# Patient Record
Sex: Female | Born: 1965 | Race: Black or African American | Hispanic: No | Marital: Married | Smoking: Never smoker
Health system: Southern US, Community
[De-identification: ages and names within clinical notes are randomized; demographics above are authoritative.]

## PROBLEM LIST (undated history)

## (undated) DIAGNOSIS — M1990 Unspecified osteoarthritis, unspecified site: Secondary | ICD-10-CM

## (undated) DIAGNOSIS — I959 Hypotension, unspecified: Secondary | ICD-10-CM

## (undated) DIAGNOSIS — S83206A Unspecified tear of unspecified meniscus, current injury, right knee, initial encounter: Secondary | ICD-10-CM

## (undated) DIAGNOSIS — R55 Syncope and collapse: Secondary | ICD-10-CM

## (undated) DIAGNOSIS — H6690 Otitis media, unspecified, unspecified ear: Secondary | ICD-10-CM

## (undated) DIAGNOSIS — K219 Gastro-esophageal reflux disease without esophagitis: Secondary | ICD-10-CM

## (undated) DIAGNOSIS — A0472 Enterocolitis due to Clostridium difficile, not specified as recurrent: Secondary | ICD-10-CM

## (undated) DIAGNOSIS — R839 Unspecified abnormal finding in cerebrospinal fluid: Secondary | ICD-10-CM

## (undated) DIAGNOSIS — B9681 Helicobacter pylori [H. pylori] as the cause of diseases classified elsewhere: Secondary | ICD-10-CM

## (undated) DIAGNOSIS — E669 Obesity, unspecified: Secondary | ICD-10-CM

## (undated) DIAGNOSIS — G932 Benign intracranial hypertension: Secondary | ICD-10-CM

## (undated) DIAGNOSIS — E785 Hyperlipidemia, unspecified: Secondary | ICD-10-CM

## (undated) DIAGNOSIS — K2970 Gastritis, unspecified, without bleeding: Secondary | ICD-10-CM

## (undated) DIAGNOSIS — E039 Hypothyroidism, unspecified: Secondary | ICD-10-CM

## (undated) DIAGNOSIS — N200 Calculus of kidney: Secondary | ICD-10-CM

## (undated) DIAGNOSIS — I10 Essential (primary) hypertension: Secondary | ICD-10-CM

## (undated) DIAGNOSIS — H543 Unqualified visual loss, both eyes: Secondary | ICD-10-CM

## (undated) HISTORY — DX: Obesity, unspecified: E66.9

## (undated) HISTORY — DX: Hyperlipidemia, unspecified: E78.5

## (undated) HISTORY — DX: Unspecified osteoarthritis, unspecified site: M19.90

## (undated) HISTORY — DX: Helicobacter pylori (H. pylori) as the cause of diseases classified elsewhere: B96.81

## (undated) HISTORY — DX: Enterocolitis due to Clostridium difficile, not specified as recurrent: A04.72

## (undated) HISTORY — DX: Calculus of kidney: N20.0

## (undated) HISTORY — DX: Hypothyroidism, unspecified: E03.9

## (undated) HISTORY — DX: Essential (primary) hypertension: I10

## (undated) HISTORY — DX: Benign intracranial hypertension: G93.2

## (undated) HISTORY — DX: Gastro-esophageal reflux disease without esophagitis: K21.9

## (undated) HISTORY — PX: TONSILLECTOMY: SUR1361

## (undated) HISTORY — DX: Gastritis, unspecified, without bleeding: K29.70

## (undated) HISTORY — DX: Syncope and collapse: R55

---

## 1992-12-22 DIAGNOSIS — H543 Unqualified visual loss, both eyes: Secondary | ICD-10-CM

## 1992-12-22 HISTORY — DX: Unqualified visual loss, both eyes: H54.3

## 1998-02-11 ENCOUNTER — Other Ambulatory Visit: Admission: RE | Admit: 1998-02-11 | Discharge: 1998-02-11 | Payer: Self-pay | Admitting: Family Medicine

## 1998-02-27 ENCOUNTER — Ambulatory Visit (HOSPITAL_COMMUNITY): Admission: RE | Admit: 1998-02-27 | Discharge: 1998-02-27 | Payer: Self-pay | Admitting: Neurology

## 1998-05-04 ENCOUNTER — Other Ambulatory Visit: Admission: RE | Admit: 1998-05-04 | Discharge: 1998-05-04 | Payer: Self-pay | Admitting: Obstetrics and Gynecology

## 1998-05-08 ENCOUNTER — Ambulatory Visit (HOSPITAL_COMMUNITY): Admission: RE | Admit: 1998-05-08 | Discharge: 1998-05-08 | Payer: Self-pay | Admitting: Obstetrics and Gynecology

## 1998-05-14 ENCOUNTER — Encounter: Admission: RE | Admit: 1998-05-14 | Discharge: 1998-08-12 | Payer: Self-pay | Admitting: Obstetrics

## 1998-06-10 ENCOUNTER — Encounter: Admission: RE | Admit: 1998-06-10 | Discharge: 1998-09-08 | Payer: Self-pay | Admitting: Obstetrics & Gynecology

## 1998-06-22 ENCOUNTER — Ambulatory Visit (HOSPITAL_COMMUNITY): Admission: RE | Admit: 1998-06-22 | Discharge: 1998-06-22 | Payer: Self-pay | Admitting: Obstetrics & Gynecology

## 1998-07-21 ENCOUNTER — Ambulatory Visit (HOSPITAL_COMMUNITY): Admission: RE | Admit: 1998-07-21 | Discharge: 1998-07-21 | Payer: Self-pay | Admitting: Obstetrics

## 1998-07-23 ENCOUNTER — Ambulatory Visit (HOSPITAL_COMMUNITY): Admission: RE | Admit: 1998-07-23 | Discharge: 1998-07-23 | Payer: Self-pay | Admitting: Obstetrics & Gynecology

## 1998-07-29 ENCOUNTER — Encounter: Admission: RE | Admit: 1998-07-29 | Discharge: 1998-07-29 | Payer: Self-pay | Admitting: Obstetrics & Gynecology

## 1998-08-05 ENCOUNTER — Encounter: Admission: RE | Admit: 1998-08-05 | Discharge: 1998-08-05 | Payer: Self-pay | Admitting: Obstetrics & Gynecology

## 1998-08-06 ENCOUNTER — Ambulatory Visit (HOSPITAL_COMMUNITY): Admission: RE | Admit: 1998-08-06 | Discharge: 1998-08-06 | Payer: Self-pay | Admitting: Neurology

## 1998-08-09 ENCOUNTER — Emergency Department (HOSPITAL_COMMUNITY): Admission: EM | Admit: 1998-08-09 | Discharge: 1998-08-09 | Payer: Self-pay | Admitting: *Deleted

## 1998-08-19 ENCOUNTER — Encounter: Admission: RE | Admit: 1998-08-19 | Discharge: 1998-08-19 | Payer: Self-pay | Admitting: Obstetrics

## 1998-09-02 ENCOUNTER — Encounter: Admission: RE | Admit: 1998-09-02 | Discharge: 1998-09-02 | Payer: Self-pay | Admitting: Obstetrics

## 1998-09-04 ENCOUNTER — Ambulatory Visit (HOSPITAL_COMMUNITY): Admission: RE | Admit: 1998-09-04 | Discharge: 1998-09-04 | Payer: Self-pay | Admitting: Obstetrics

## 1998-09-09 ENCOUNTER — Encounter: Admission: RE | Admit: 1998-09-09 | Discharge: 1998-09-09 | Payer: Self-pay | Admitting: Obstetrics & Gynecology

## 1998-09-11 ENCOUNTER — Encounter (HOSPITAL_COMMUNITY): Admission: RE | Admit: 1998-09-11 | Discharge: 1998-10-16 | Payer: Self-pay | Admitting: Obstetrics

## 1998-09-16 ENCOUNTER — Encounter: Admission: RE | Admit: 1998-09-16 | Discharge: 1998-09-16 | Payer: Self-pay | Admitting: Obstetrics & Gynecology

## 1998-09-30 ENCOUNTER — Encounter: Admission: RE | Admit: 1998-09-30 | Discharge: 1998-09-30 | Payer: Self-pay | Admitting: Obstetrics & Gynecology

## 1998-10-07 ENCOUNTER — Encounter: Admission: RE | Admit: 1998-10-07 | Discharge: 1998-10-07 | Payer: Self-pay | Admitting: Obstetrics & Gynecology

## 1998-10-14 ENCOUNTER — Inpatient Hospital Stay (HOSPITAL_COMMUNITY): Admission: AD | Admit: 1998-10-14 | Discharge: 1998-10-18 | Payer: Self-pay | Admitting: Obstetrics

## 1999-06-17 ENCOUNTER — Encounter: Admission: RE | Admit: 1999-06-17 | Discharge: 1999-06-17 | Payer: Self-pay | Admitting: Obstetrics

## 1999-06-25 ENCOUNTER — Ambulatory Visit (HOSPITAL_COMMUNITY): Admission: RE | Admit: 1999-06-25 | Discharge: 1999-06-25 | Payer: Self-pay | Admitting: *Deleted

## 1999-07-01 ENCOUNTER — Encounter: Admission: RE | Admit: 1999-07-01 | Discharge: 1999-07-01 | Payer: Self-pay | Admitting: Obstetrics

## 1999-07-20 ENCOUNTER — Ambulatory Visit (HOSPITAL_COMMUNITY): Admission: RE | Admit: 1999-07-20 | Discharge: 1999-07-20 | Payer: Self-pay | Admitting: *Deleted

## 1999-07-22 ENCOUNTER — Encounter: Admission: RE | Admit: 1999-07-22 | Discharge: 1999-07-22 | Payer: Self-pay | Admitting: Obstetrics

## 1999-08-05 ENCOUNTER — Encounter: Admission: RE | Admit: 1999-08-05 | Discharge: 1999-08-05 | Payer: Self-pay | Admitting: Obstetrics

## 1999-09-02 ENCOUNTER — Encounter: Admission: RE | Admit: 1999-09-02 | Discharge: 1999-09-02 | Payer: Self-pay | Admitting: Obstetrics

## 1999-09-23 ENCOUNTER — Ambulatory Visit (HOSPITAL_COMMUNITY): Admission: RE | Admit: 1999-09-23 | Discharge: 1999-09-23 | Payer: Self-pay | Admitting: Obstetrics & Gynecology

## 1999-09-23 ENCOUNTER — Encounter: Admission: RE | Admit: 1999-09-23 | Discharge: 1999-09-23 | Payer: Self-pay | Admitting: Obstetrics

## 1999-09-29 ENCOUNTER — Encounter: Admission: RE | Admit: 1999-09-29 | Discharge: 1999-12-28 | Payer: Self-pay | Admitting: Obstetrics & Gynecology

## 1999-09-29 ENCOUNTER — Encounter: Admission: RE | Admit: 1999-09-29 | Discharge: 1999-09-29 | Payer: Self-pay | Admitting: Obstetrics & Gynecology

## 1999-10-13 ENCOUNTER — Encounter: Admission: RE | Admit: 1999-10-13 | Discharge: 1999-10-13 | Payer: Self-pay | Admitting: Obstetrics & Gynecology

## 1999-10-27 ENCOUNTER — Encounter: Admission: RE | Admit: 1999-10-27 | Discharge: 1999-10-27 | Payer: Self-pay | Admitting: Obstetrics & Gynecology

## 1999-11-10 ENCOUNTER — Encounter: Admission: RE | Admit: 1999-11-10 | Discharge: 1999-11-10 | Payer: Self-pay | Admitting: Obstetrics & Gynecology

## 1999-11-17 ENCOUNTER — Encounter: Admission: RE | Admit: 1999-11-17 | Discharge: 1999-11-17 | Payer: Self-pay | Admitting: Obstetrics & Gynecology

## 1999-11-17 ENCOUNTER — Encounter (HOSPITAL_COMMUNITY): Admission: RE | Admit: 1999-11-17 | Discharge: 1999-12-30 | Payer: Self-pay | Admitting: Obstetrics & Gynecology

## 1999-11-24 ENCOUNTER — Encounter: Admission: RE | Admit: 1999-11-24 | Discharge: 1999-11-24 | Payer: Self-pay | Admitting: Obstetrics & Gynecology

## 1999-12-01 ENCOUNTER — Encounter: Admission: RE | Admit: 1999-12-01 | Discharge: 1999-12-01 | Payer: Self-pay | Admitting: Obstetrics & Gynecology

## 1999-12-08 ENCOUNTER — Encounter: Admission: RE | Admit: 1999-12-08 | Discharge: 1999-12-08 | Payer: Self-pay | Admitting: Obstetrics & Gynecology

## 1999-12-15 ENCOUNTER — Encounter: Admission: RE | Admit: 1999-12-15 | Discharge: 1999-12-15 | Payer: Self-pay | Admitting: Obstetrics & Gynecology

## 1999-12-22 ENCOUNTER — Encounter: Admission: RE | Admit: 1999-12-22 | Discharge: 1999-12-22 | Payer: Self-pay | Admitting: Obstetrics & Gynecology

## 1999-12-24 ENCOUNTER — Inpatient Hospital Stay (HOSPITAL_COMMUNITY): Admission: AD | Admit: 1999-12-24 | Discharge: 1999-12-29 | Payer: Self-pay | Admitting: *Deleted

## 2000-02-28 ENCOUNTER — Ambulatory Visit (HOSPITAL_COMMUNITY): Admission: RE | Admit: 2000-02-28 | Discharge: 2000-02-28 | Payer: Self-pay | Admitting: Neurology

## 2000-02-28 ENCOUNTER — Encounter: Payer: Self-pay | Admitting: Neurology

## 2001-05-01 ENCOUNTER — Encounter: Payer: Self-pay | Admitting: Neurology

## 2001-05-01 ENCOUNTER — Ambulatory Visit (HOSPITAL_COMMUNITY): Admission: RE | Admit: 2001-05-01 | Discharge: 2001-05-01 | Payer: Self-pay | Admitting: Neurology

## 2001-10-18 ENCOUNTER — Encounter: Admission: RE | Admit: 2001-10-18 | Discharge: 2001-10-18 | Payer: Self-pay | Admitting: Obstetrics

## 2001-10-18 ENCOUNTER — Ambulatory Visit (HOSPITAL_COMMUNITY): Admission: RE | Admit: 2001-10-18 | Discharge: 2001-10-18 | Payer: Self-pay | Admitting: Obstetrics

## 2001-10-25 ENCOUNTER — Encounter: Admission: RE | Admit: 2001-10-25 | Discharge: 2001-10-25 | Payer: Self-pay | Admitting: Obstetrics

## 2001-11-14 ENCOUNTER — Encounter: Admission: RE | Admit: 2001-11-14 | Discharge: 2001-11-14 | Payer: Self-pay | Admitting: Obstetrics & Gynecology

## 2001-12-05 ENCOUNTER — Encounter: Admission: RE | Admit: 2001-12-05 | Discharge: 2001-12-05 | Payer: Self-pay | Admitting: *Deleted

## 2001-12-12 ENCOUNTER — Encounter: Admission: RE | Admit: 2001-12-12 | Discharge: 2001-12-12 | Payer: Self-pay | Admitting: *Deleted

## 2001-12-14 ENCOUNTER — Ambulatory Visit (HOSPITAL_COMMUNITY): Admission: RE | Admit: 2001-12-14 | Discharge: 2001-12-14 | Payer: Self-pay | Admitting: *Deleted

## 2001-12-19 ENCOUNTER — Encounter: Admission: RE | Admit: 2001-12-19 | Discharge: 2001-12-19 | Payer: Self-pay | Admitting: *Deleted

## 2002-01-02 ENCOUNTER — Encounter: Admission: RE | Admit: 2002-01-02 | Discharge: 2002-01-02 | Payer: Self-pay | Admitting: *Deleted

## 2002-01-16 ENCOUNTER — Encounter: Admission: RE | Admit: 2002-01-16 | Discharge: 2002-01-16 | Payer: Self-pay | Admitting: *Deleted

## 2002-01-29 ENCOUNTER — Ambulatory Visit (HOSPITAL_COMMUNITY): Admission: RE | Admit: 2002-01-29 | Discharge: 2002-01-29 | Payer: Self-pay | Admitting: *Deleted

## 2002-01-30 ENCOUNTER — Encounter: Admission: RE | Admit: 2002-01-30 | Discharge: 2002-01-30 | Payer: Self-pay | Admitting: *Deleted

## 2002-02-06 ENCOUNTER — Encounter: Admission: RE | Admit: 2002-02-06 | Discharge: 2002-02-06 | Payer: Self-pay | Admitting: *Deleted

## 2002-02-20 ENCOUNTER — Encounter: Admission: RE | Admit: 2002-02-20 | Discharge: 2002-02-20 | Payer: Self-pay | Admitting: *Deleted

## 2002-02-20 ENCOUNTER — Encounter (HOSPITAL_COMMUNITY): Admission: AD | Admit: 2002-02-20 | Discharge: 2002-03-22 | Payer: Self-pay | Admitting: *Deleted

## 2002-02-27 ENCOUNTER — Encounter: Admission: RE | Admit: 2002-02-27 | Discharge: 2002-02-27 | Payer: Self-pay | Admitting: *Deleted

## 2002-03-06 ENCOUNTER — Encounter: Admission: RE | Admit: 2002-03-06 | Discharge: 2002-03-06 | Payer: Self-pay | Admitting: *Deleted

## 2002-03-13 ENCOUNTER — Encounter: Admission: RE | Admit: 2002-03-13 | Discharge: 2002-03-13 | Payer: Self-pay | Admitting: *Deleted

## 2002-03-13 ENCOUNTER — Ambulatory Visit (HOSPITAL_COMMUNITY): Admission: RE | Admit: 2002-03-13 | Discharge: 2002-03-13 | Payer: Self-pay | Admitting: *Deleted

## 2002-03-20 ENCOUNTER — Encounter: Admission: RE | Admit: 2002-03-20 | Discharge: 2002-03-20 | Payer: Self-pay | Admitting: *Deleted

## 2002-03-27 ENCOUNTER — Encounter: Admission: RE | Admit: 2002-03-27 | Discharge: 2002-03-27 | Payer: Self-pay | Admitting: Internal Medicine

## 2002-03-27 ENCOUNTER — Encounter (HOSPITAL_COMMUNITY): Admission: RE | Admit: 2002-03-27 | Discharge: 2002-03-31 | Payer: Self-pay | Admitting: *Deleted

## 2002-04-02 ENCOUNTER — Inpatient Hospital Stay (HOSPITAL_COMMUNITY): Admission: AD | Admit: 2002-04-02 | Discharge: 2002-04-06 | Payer: Self-pay | Admitting: *Deleted

## 2002-05-27 ENCOUNTER — Encounter: Payer: Self-pay | Admitting: Neurology

## 2002-05-27 ENCOUNTER — Ambulatory Visit (HOSPITAL_COMMUNITY): Admission: RE | Admit: 2002-05-27 | Discharge: 2002-05-27 | Payer: Self-pay | Admitting: Neurology

## 2003-07-20 ENCOUNTER — Emergency Department (HOSPITAL_COMMUNITY): Admission: EM | Admit: 2003-07-20 | Discharge: 2003-07-21 | Payer: Self-pay | Admitting: Emergency Medicine

## 2003-07-21 ENCOUNTER — Encounter: Payer: Self-pay | Admitting: Emergency Medicine

## 2003-07-21 IMAGING — CT CT HEAD W/O CM
1 of 2 series · 13 of 30 positions shown, 17 images · non-contrast
Comparison: none

FINDINGS
CLINICAL DATA: DIZZINESS.  HISTORY OF PSEUDO TUMOR CEREBRI AND VISION LOSS.
CT HEAD WITHOUT CONTRAST [DATE]
COMPARING TO REPORT FROM MRI OF THE BRAIN DATED [DATE].
TECHNIQUE: CONTIGUOUS AXIAL CT IMAGES WERE OBTAINED FROM THE SKULL BASE TO THE VERTEX.

[Series 2: — · axial · 0.43mm/px · z∈[-196,-76]mm · 13 of 29 slices shown, 17 images]
[im 3/29  brain]
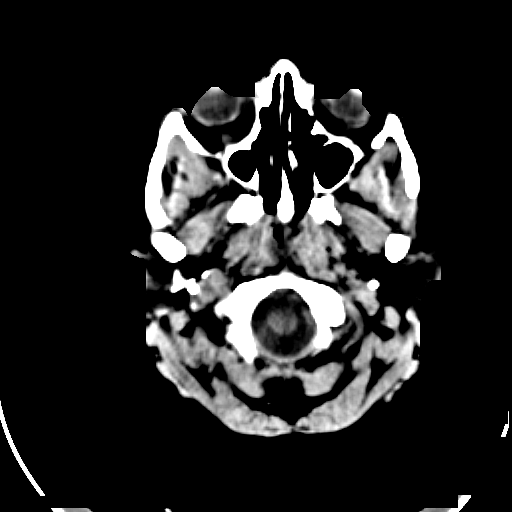
[im 3/29  bone]
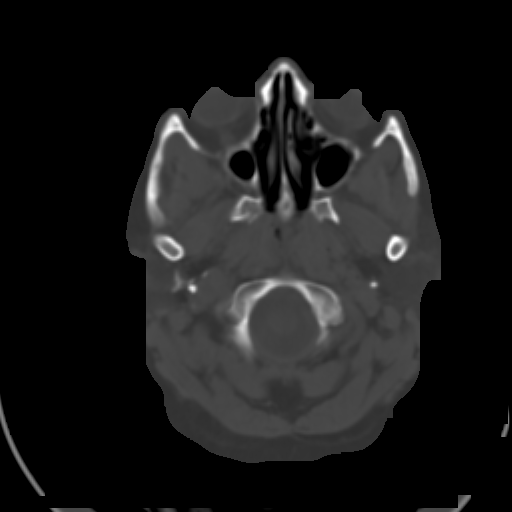
[im 5/29  brain]
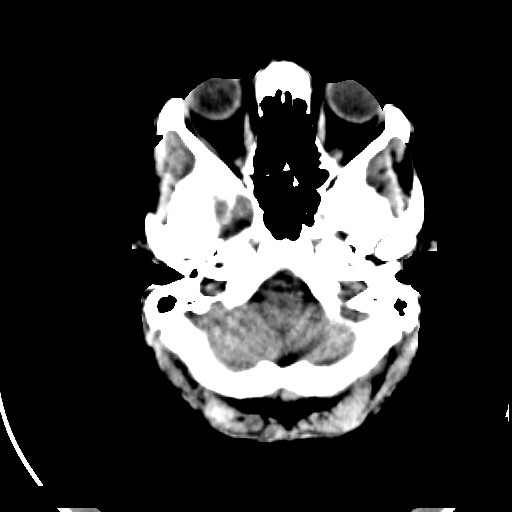
[im 7/29  brain]
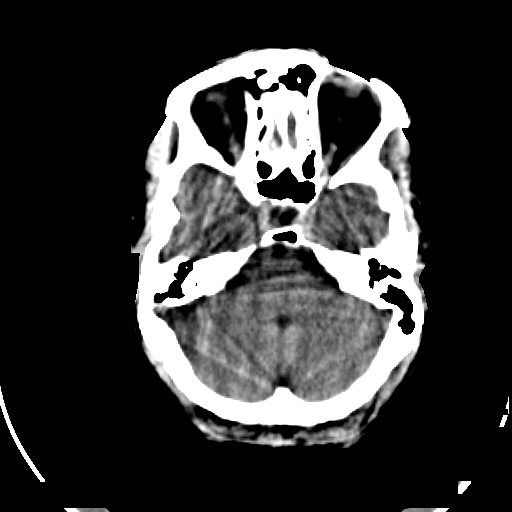
[im 9/29  brain]
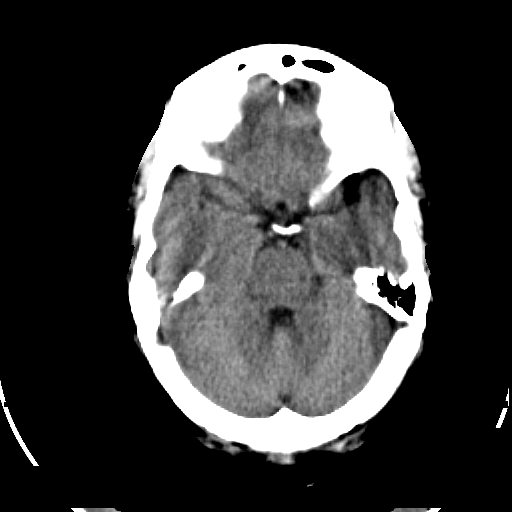
[im 11/29  brain]
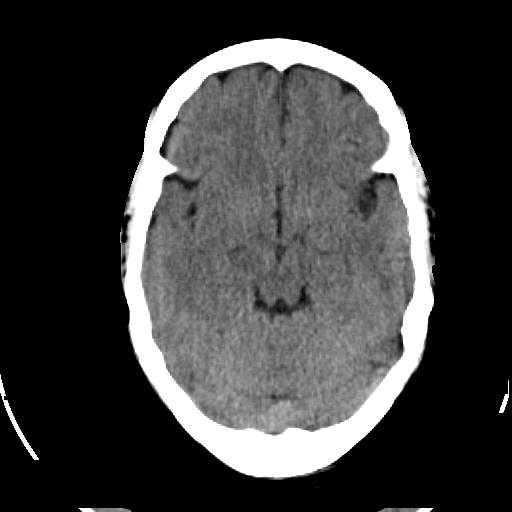
[im 11/29  bone]
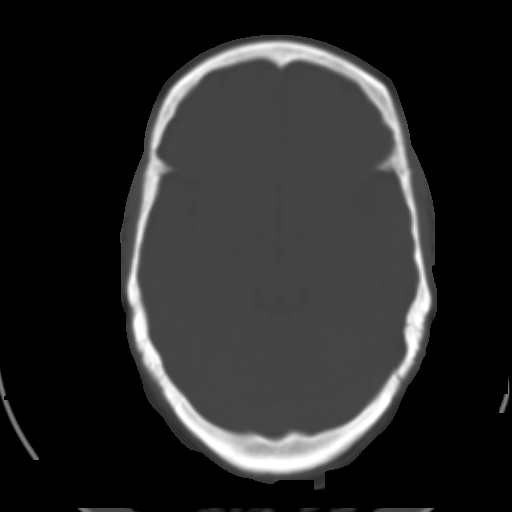
[im 13/29  brain]
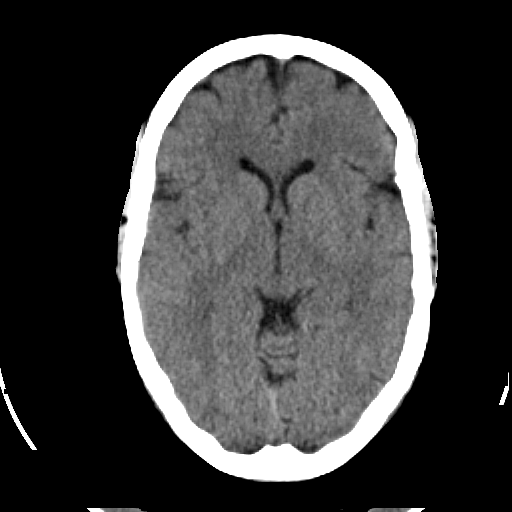
[im 15/29  brain]
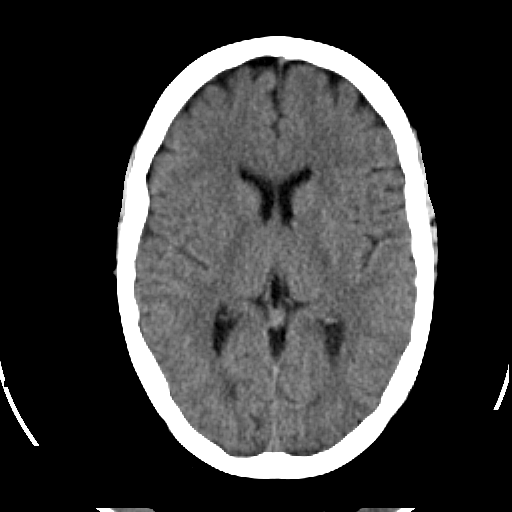
[im 17/29  brain]
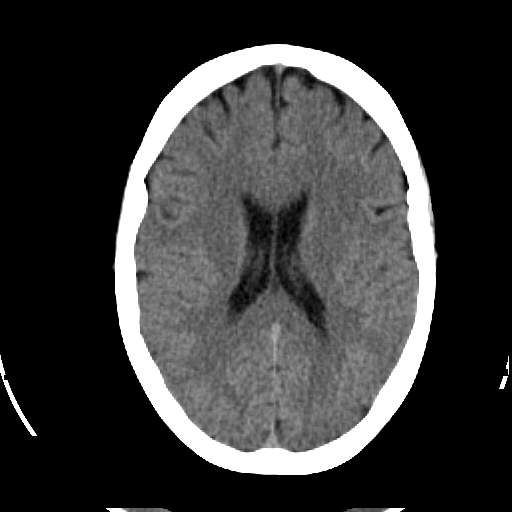
[im 19/29  brain]
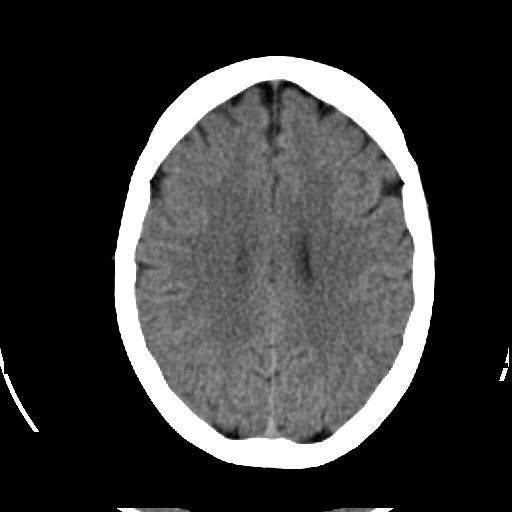
[im 19/29  bone]
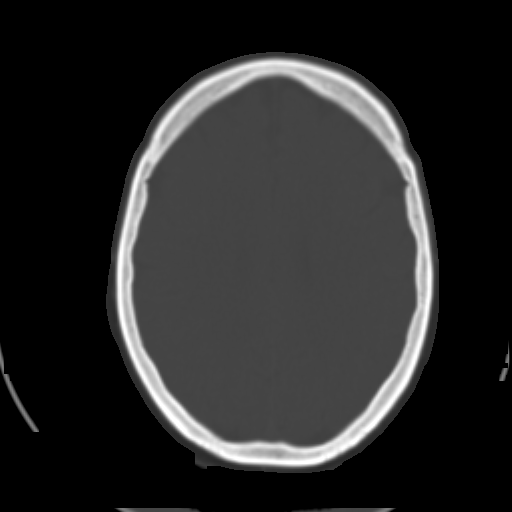
[im 21/29  brain]
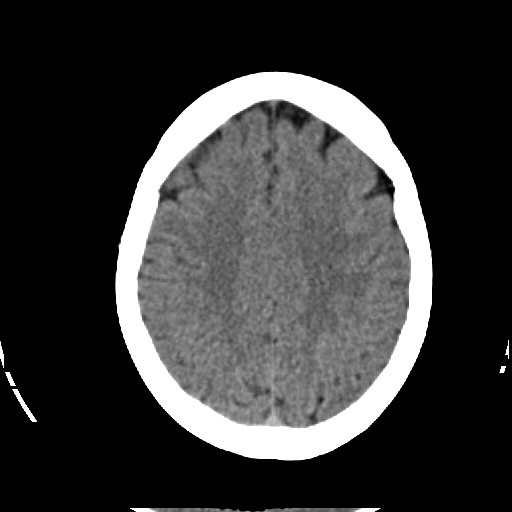
[im 23/29  brain]
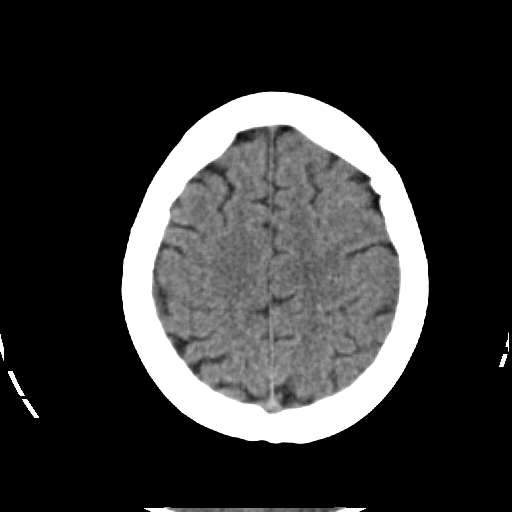
[im 25/29  brain]
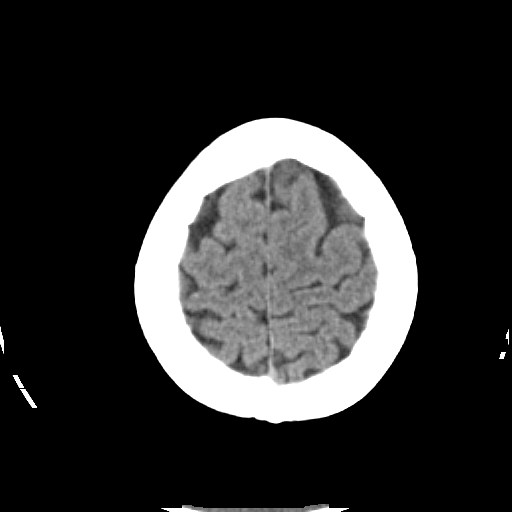
[im 27/29  brain]
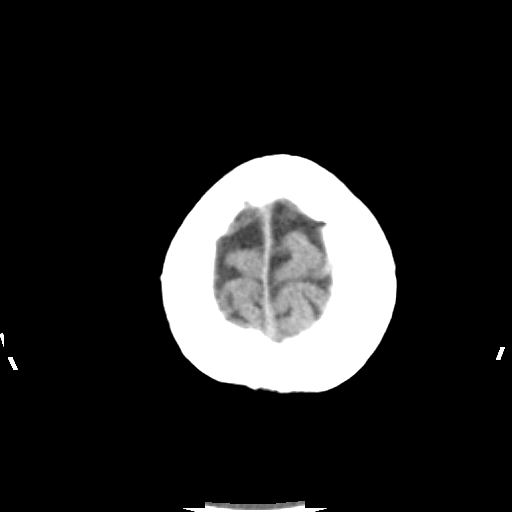
[im 27/29  bone]
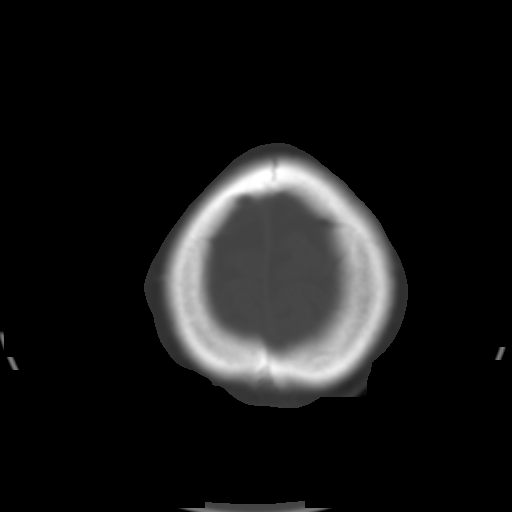

[13 of 30 positions shown; findings below may reference images not displayed]

FINDINGS: THERE IS NO EVIDENCE OF INTRACRANIAL HEMORRHAGE, BRAIN EDEMA, OR MASS EFFECT.  THE
VENTRICLES ARE NORMAL.  NO EXTRA-AXIAL ABNORMALITIES ARE IDENTIFIED.  BONE WINDOWS SHOW NO
SIGNIFICANT ABNORMALITIES.
IMPRESSION
NEGATIVE NON-CONTRAST HEAD CT.

## 2003-07-29 ENCOUNTER — Encounter: Payer: Self-pay | Admitting: Neurology

## 2003-07-29 ENCOUNTER — Ambulatory Visit (HOSPITAL_COMMUNITY): Admission: RE | Admit: 2003-07-29 | Discharge: 2003-07-29 | Payer: Self-pay | Admitting: Neurology

## 2004-08-30 ENCOUNTER — Ambulatory Visit (HOSPITAL_COMMUNITY): Admission: RE | Admit: 2004-08-30 | Discharge: 2004-08-30 | Payer: Self-pay | Admitting: Neurology

## 2004-08-30 IMAGING — CT CT HEAD W/O CM
1 of 2 series · 13 of 30 positions shown, 17 images · non-contrast
Comparison: [DATE].

CLINICAL DATA: 38-year-old female with headaches. 
 CT HEAD WITHOUT CONTRAST [DATE]:

[Series 2: brain · axial · 0.47mm/px · z∈[+153,+280]mm · 13 of 28 slices shown, 17 images]
[im 2/28  brain]
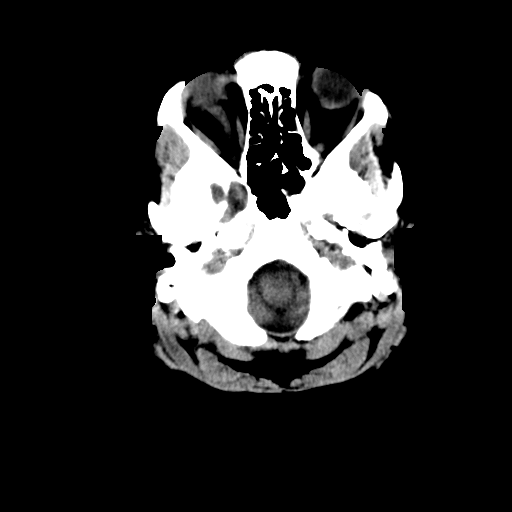
[im 2/28  bone]
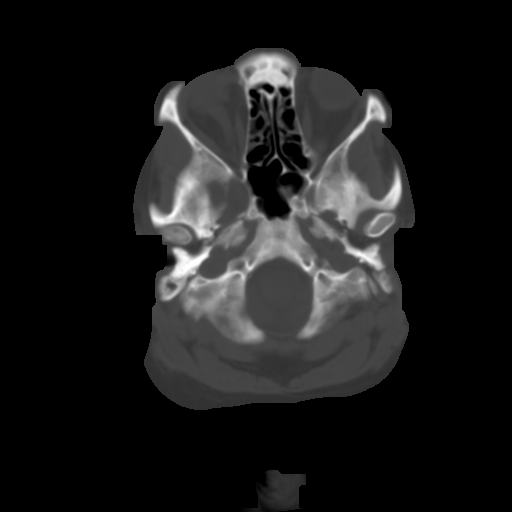
[im 4/28  brain]
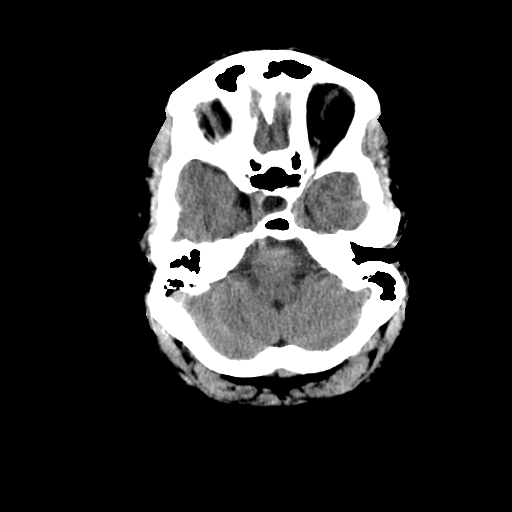
[im 6/28  brain]
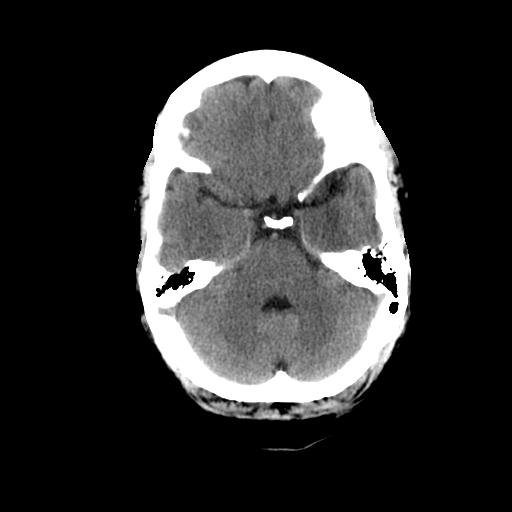
[im 8/28  brain]
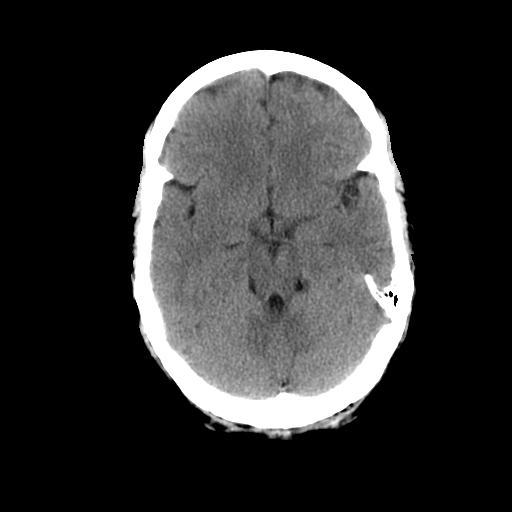
[im 10/28  brain]
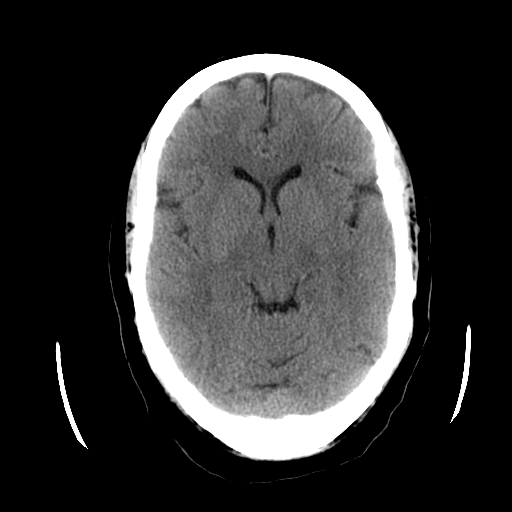
[im 10/28  bone]
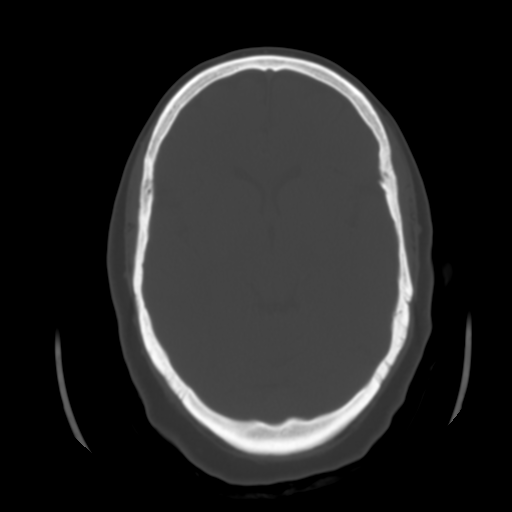
[im 12/28  brain]
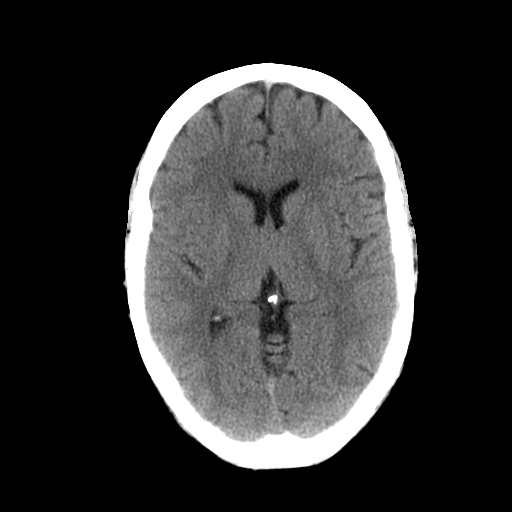
[im 14/28  brain]
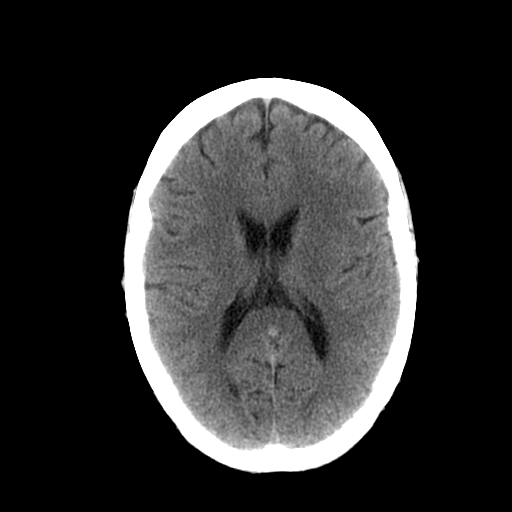
[im 16/28  brain]
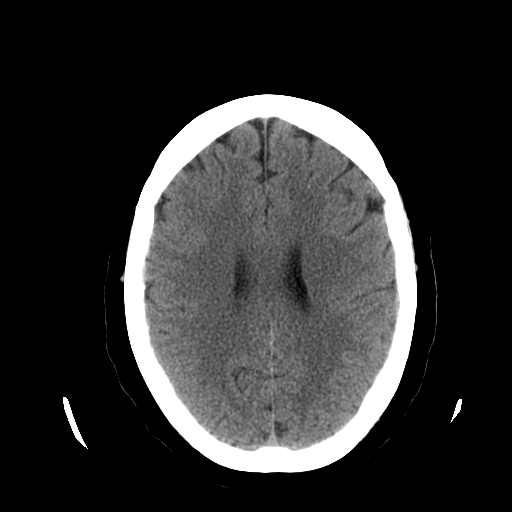
[im 18/28  brain]
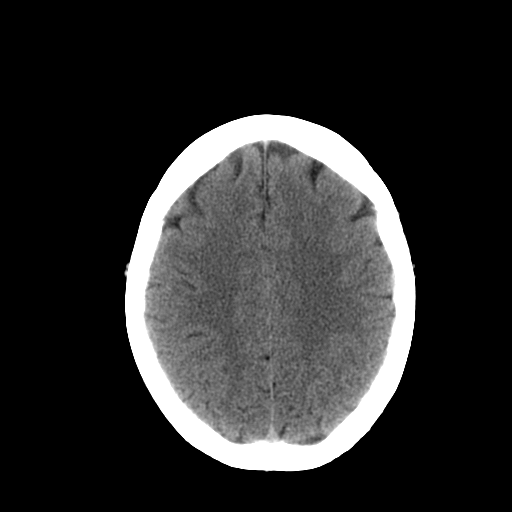
[im 18/28  bone]
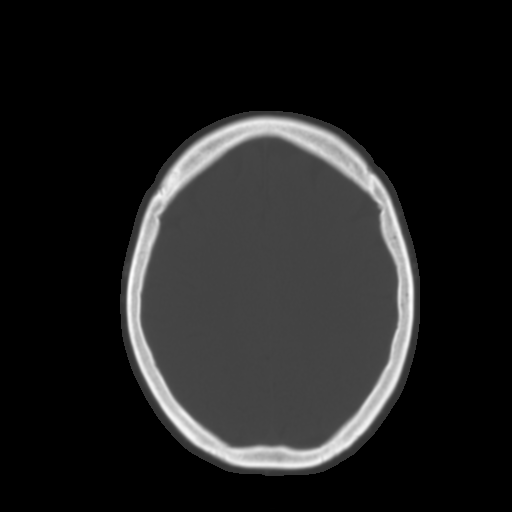
[im 20/28  brain]
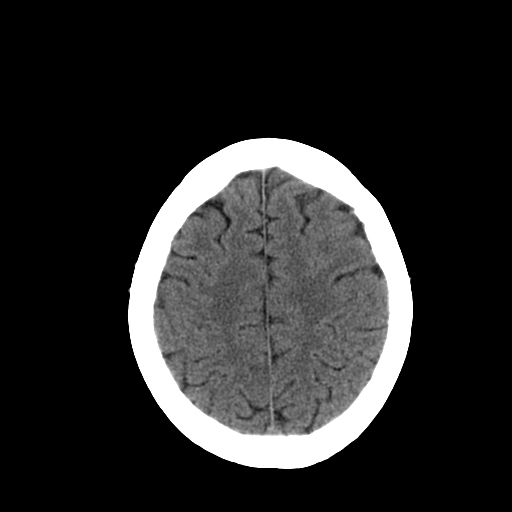
[im 22/28  brain]
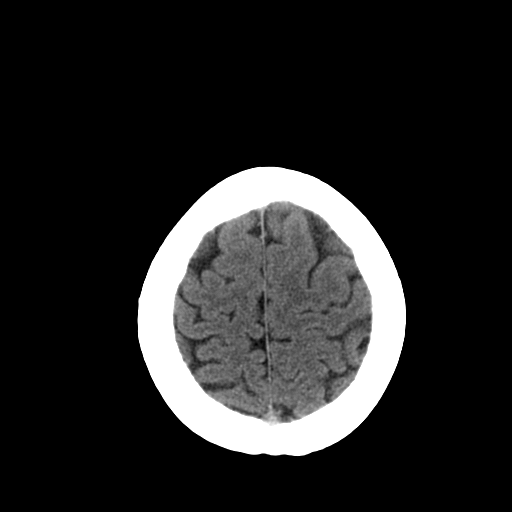
[im 24/28  brain]
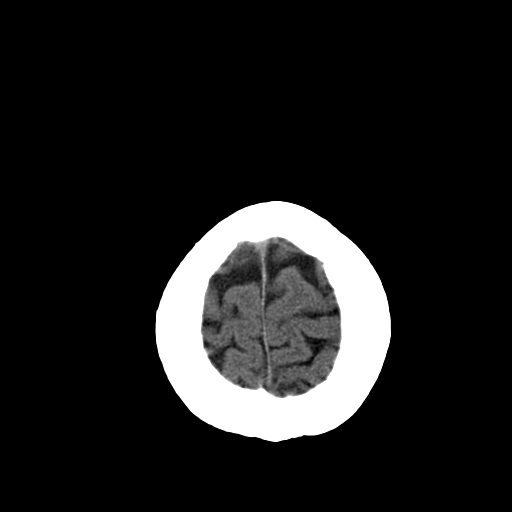
[im 26/28  brain]
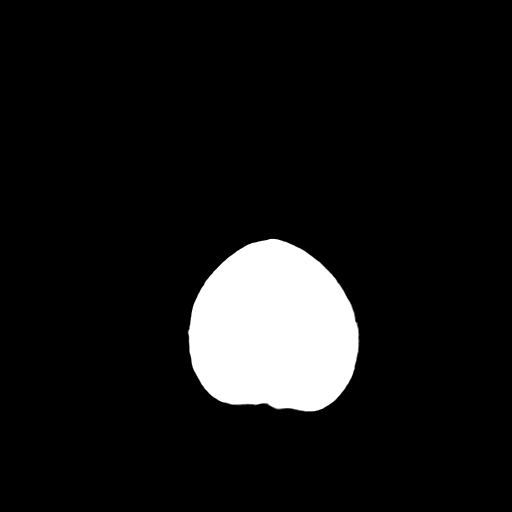
[im 26/28  bone]
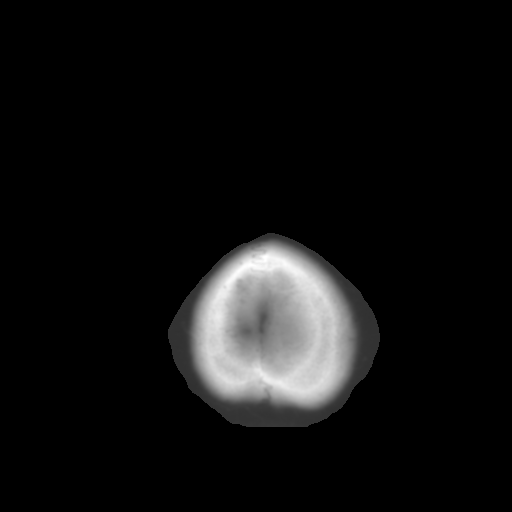

[13 of 30 positions shown; findings below may reference images not displayed]

FINDINGS: Standard noncontrast axial CT. 
 No acute edema, mass effect, hemorrhage, herniation, hydrocephalus, midline shift, or extraaxial fluid collection.  Ventricles are symmetric.  Cisterns are patent.  Gray and white matter differentiation is well-maintained.  Minimal ethmoidal and sphenoid mucosal thickening.  The mastoids are clear.
IMPRESSION: No acute intracranial abnormality.

## 2004-09-01 ENCOUNTER — Ambulatory Visit (HOSPITAL_COMMUNITY): Admission: RE | Admit: 2004-09-01 | Discharge: 2004-09-01 | Payer: Self-pay | Admitting: Neurology

## 2004-12-27 ENCOUNTER — Encounter: Admission: RE | Admit: 2004-12-27 | Discharge: 2005-03-27 | Payer: Self-pay | Admitting: Neurology

## 2006-03-16 ENCOUNTER — Encounter: Admission: RE | Admit: 2006-03-16 | Discharge: 2006-03-16 | Payer: Self-pay | Admitting: Neurology

## 2006-03-22 ENCOUNTER — Ambulatory Visit (HOSPITAL_COMMUNITY): Admission: RE | Admit: 2006-03-22 | Discharge: 2006-03-22 | Payer: Self-pay | Admitting: Neurology

## 2006-03-22 IMAGING — RF DG FLUORO GUIDE NDL PLC/BX
1 series · 1 of 1 positions shown · non-contrast
Comparison: none

CLINICAL DATA: Pseudotumor cerebri.

FLUOROSCOPICALLY GUIDED LUMBAR PUNCTURE  (THERAPEUTIC) [DATE]:
TECHNIQUE: Informed consent was obtained from the patient prior to the
procedure. Specific risks discussed with the patient included infection,
bleeding, and the possibility of an unsuccessful puncture. The patient voiced
understanding and agreed to proceed. The usual time out protocol was utilized.

[Series 1: run · 1 of 1 slices shown]
[im 1/1]
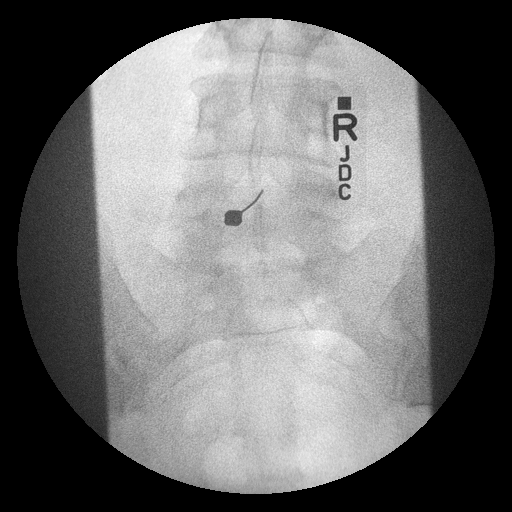

[1 of 1 positions shown; findings below may reference images not displayed]

Initially, the appropriate site for lumbar puncture was chosen with the aid of
fluoroscopy. Using the usual sterile technique and 1% lidocaine as local
anesthetic, a 20 gauge spinal needle was advanced into the thecal sac at the
L4-L5 level using fluoroscopic guidance. A total of 20 cc of clear, colorless
cerebrospinal fluid were removed. Opening pressure measured 24 cm of water. A
closing pressure was not measured, but there was no flow of CSF through the
needle upon completion.

The patient tolerated the procedure well and there were no apparent immediate
complications.
IMPRESSION: Uncomplicated therapeutic lumbar puncture under fluoroscopy as described.

## 2008-08-11 ENCOUNTER — Encounter: Admission: RE | Admit: 2008-08-11 | Discharge: 2008-08-11 | Payer: Self-pay | Admitting: Internal Medicine

## 2008-08-11 IMAGING — CR DG LUMBAR SPINE COMPLETE 4+V
5 series · 5 of 5 positions shown · non-contrast
Comparison: None

CLINICAL DATA: Back pain.

LUMBAR SPINE - COMPLETE 4+ VIEW

[view not recorded (1 of 5)]
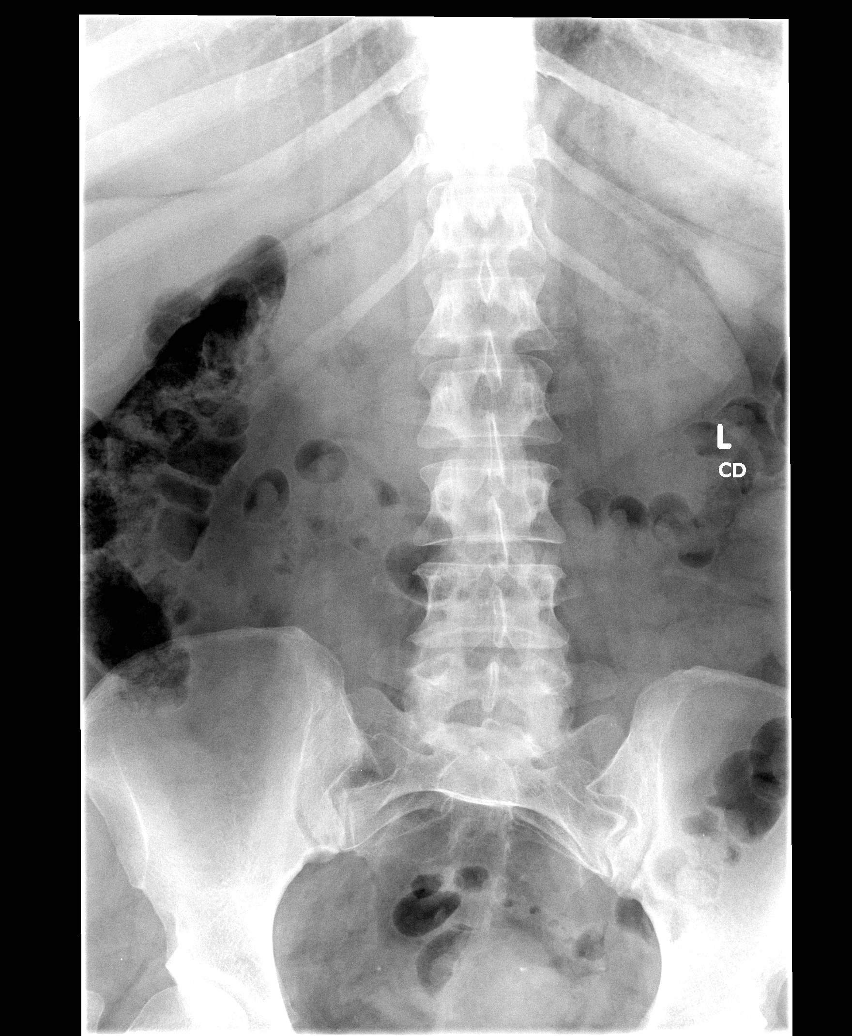

[view not recorded (2 of 5)]
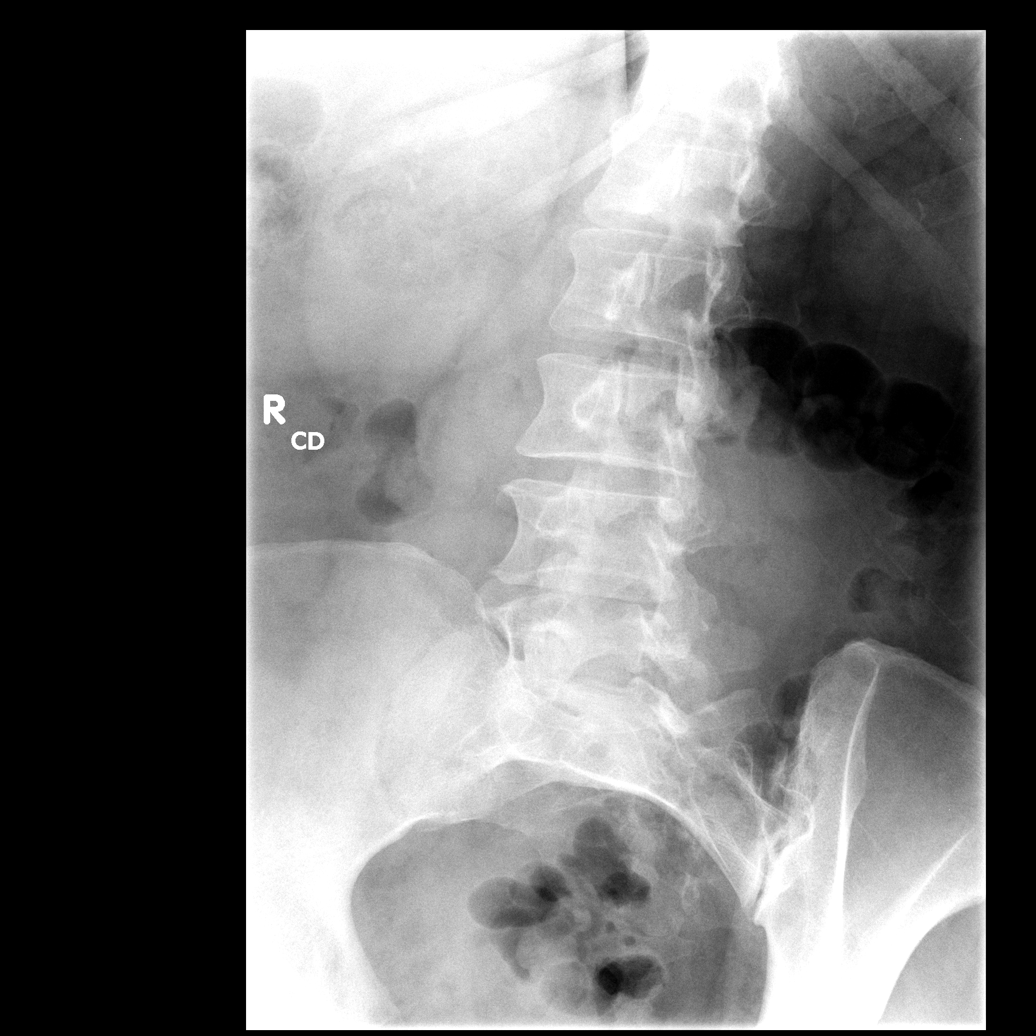

[view not recorded (3 of 5)]
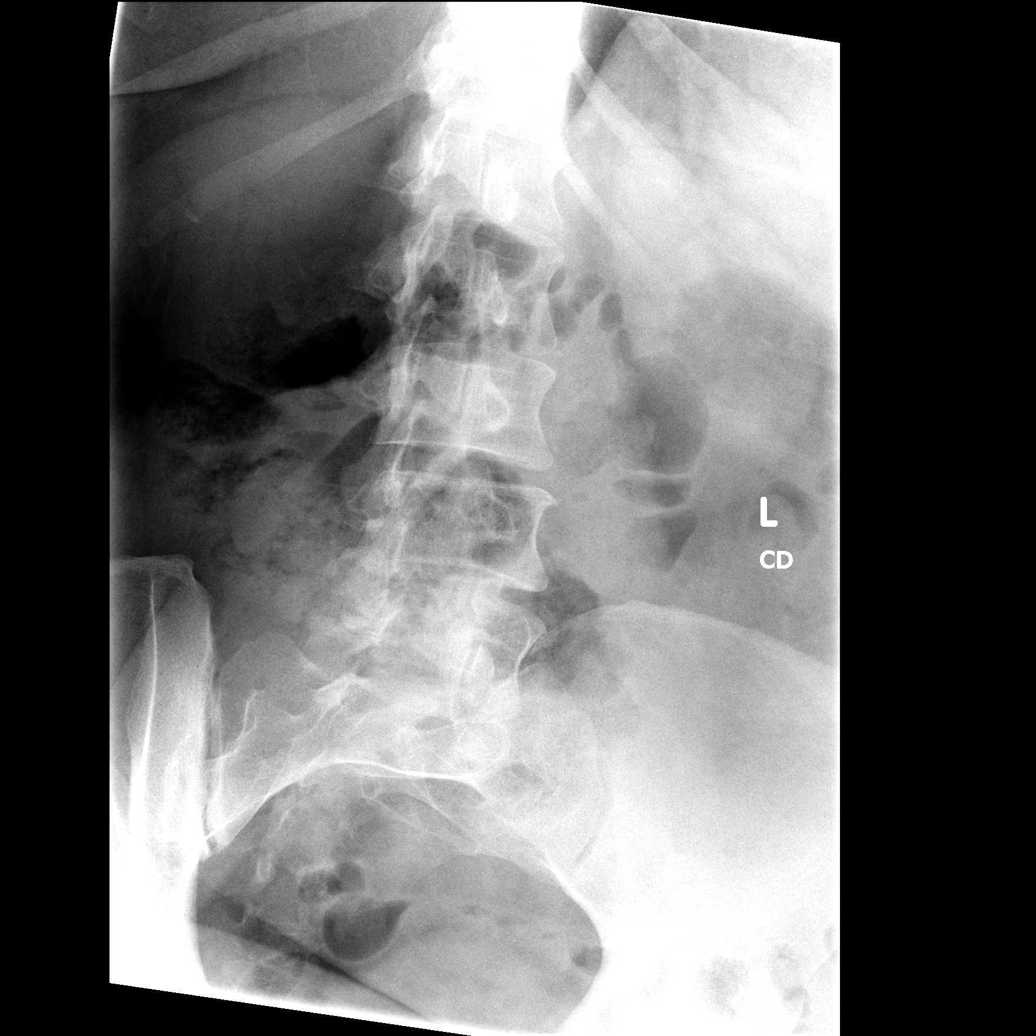

[view not recorded (4 of 5)]
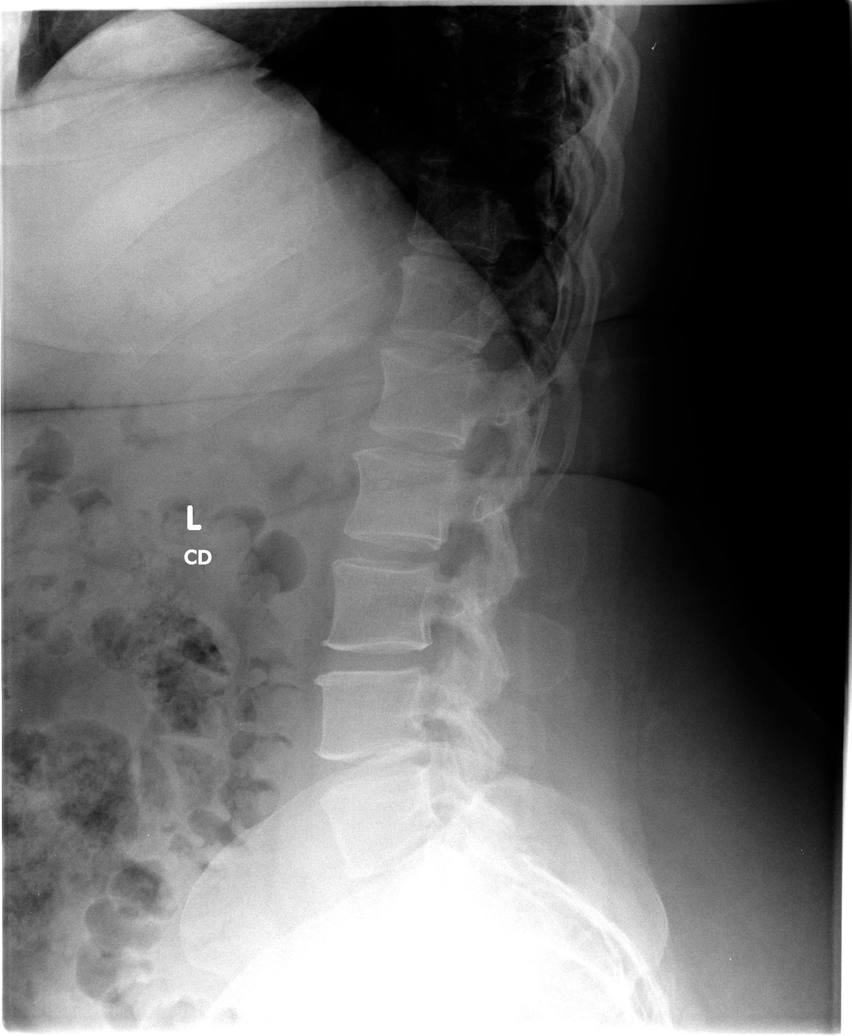

[view not recorded (5 of 5)]
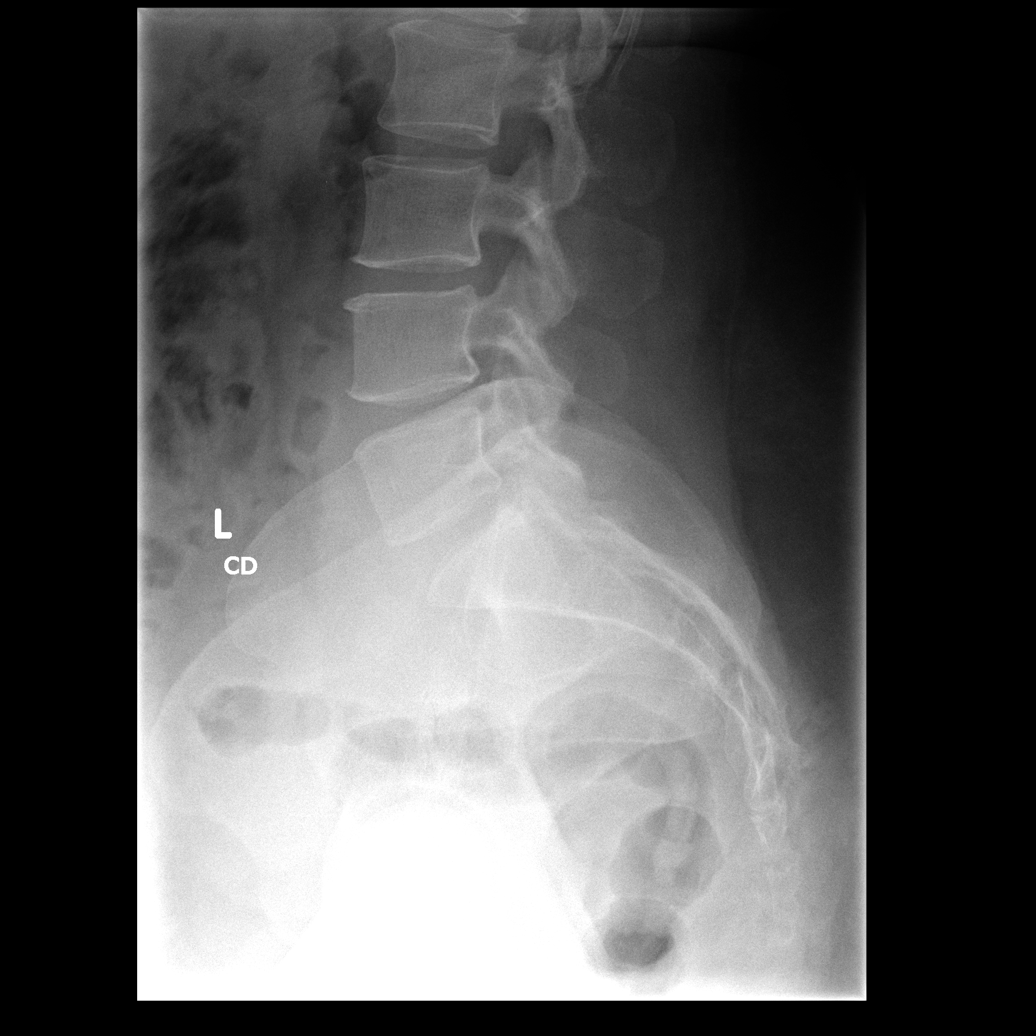

[5 of 5 positions shown; findings below may reference images not displayed]

FINDINGS: There is a mild curvature of the lumbar spine which is
convex to the left.

The vertebral body heights and the disc spaces are well preserved.

No fractures are noted.

There is no evidence for spondylolisthesis.
IMPRESSION: 1.  Mild curvature of the lumbar spine.
2.  No acute findings.

## 2008-08-11 IMAGING — CT CT HEAD W/O CM
1 series · 16 of 30 positions shown, 20 images · non-contrast
Comparison: [DATE]

CLINICAL DATA: Headaches and back pain

CT HEAD WITHOUT CONTRAST
TECHNIQUE: Contiguous axial images were obtained from the base of
the skull through the vertex without contrast.

[Series 2: head · axial · 0.49mm/px · z∈[+17,+161]mm · 16 of 32 slices shown, 20 images]
[im 2/32  brain]
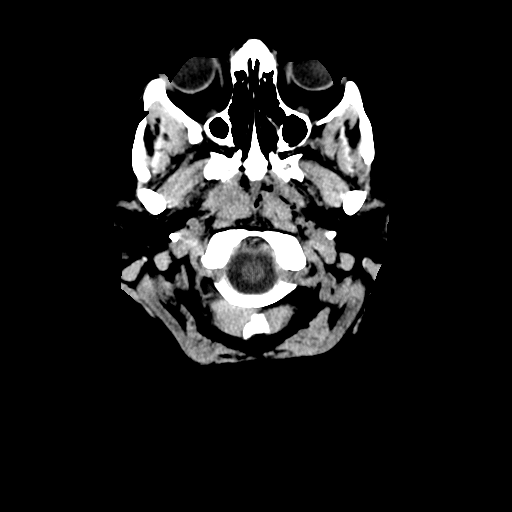
[im 2/32  bone]
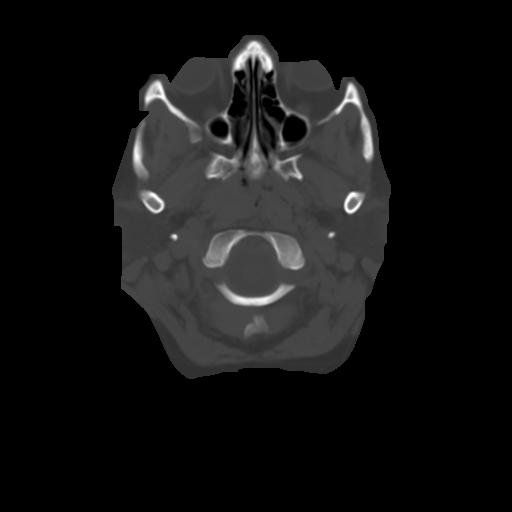
[im 4/32  brain]
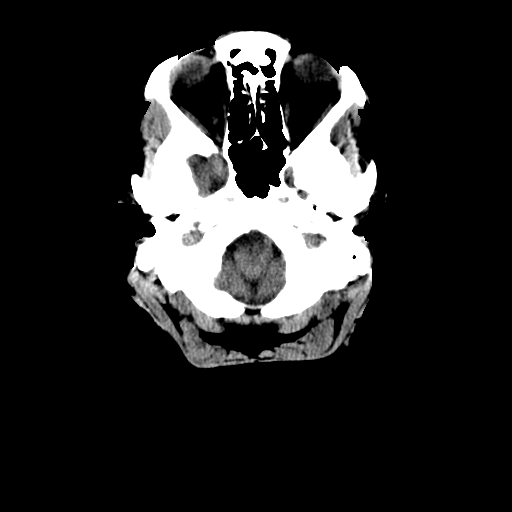
[im 6/32  brain]
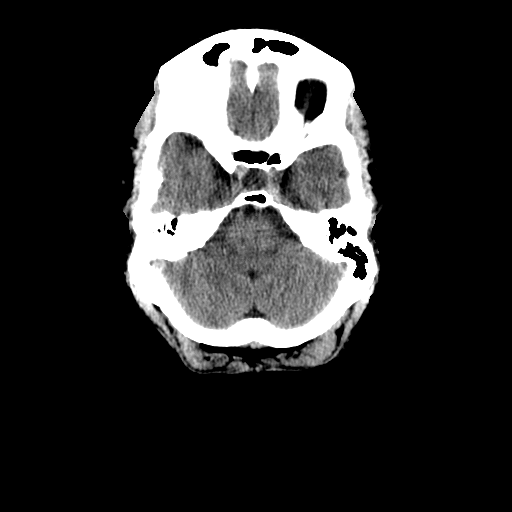
[im 8/32  brain]
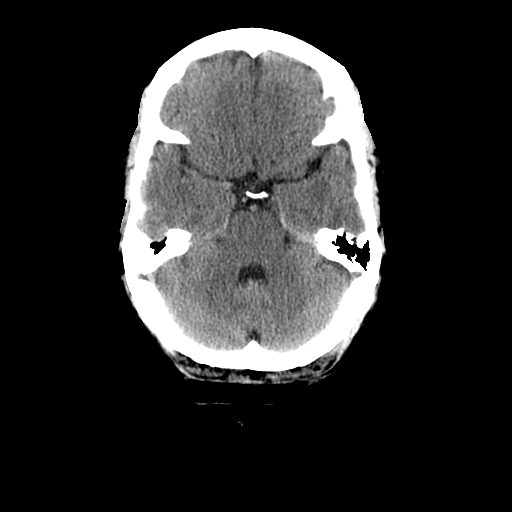
[im 9/32  brain]
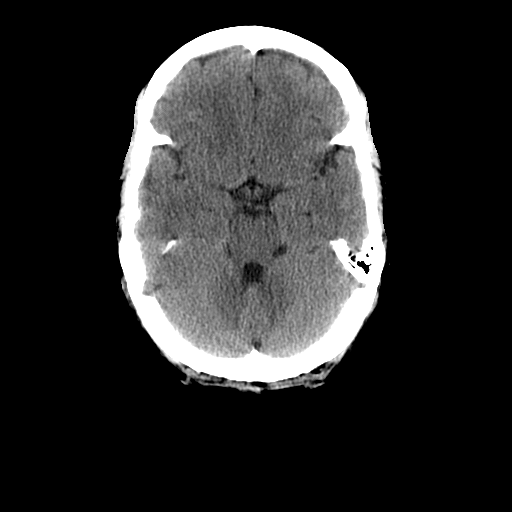
[im 9/32  bone]
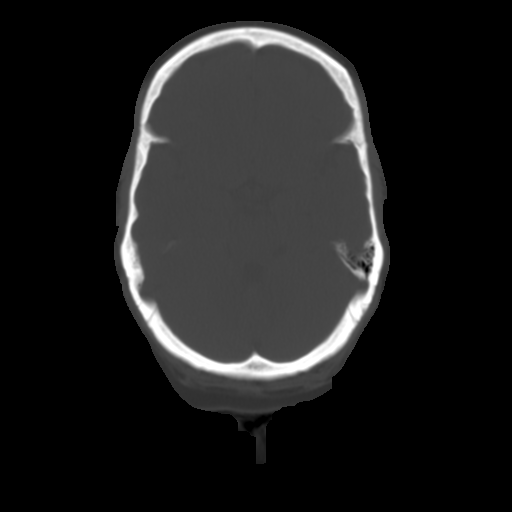
[im 11/32  brain]
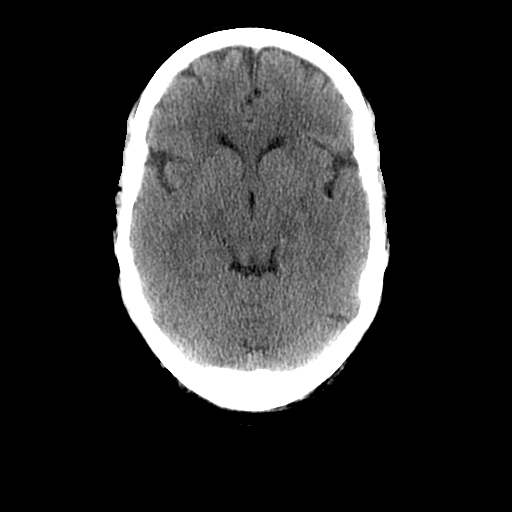
[im 13/32  brain]
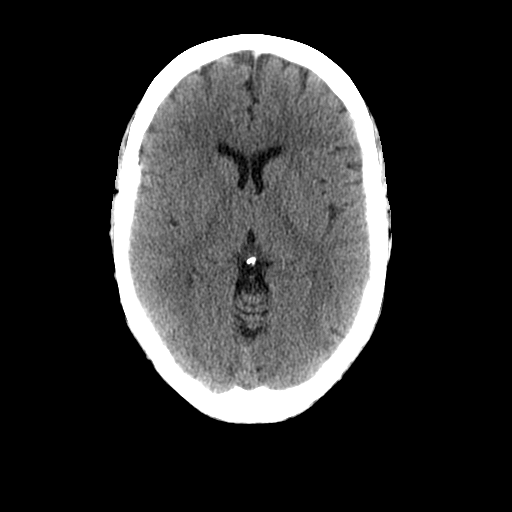
[im 15/32  brain]
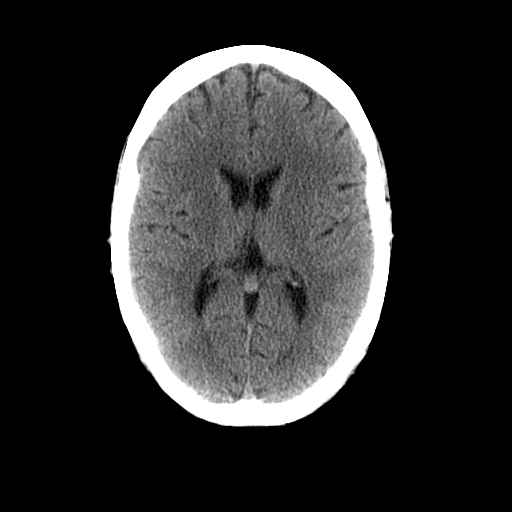
[im 17/32  brain]
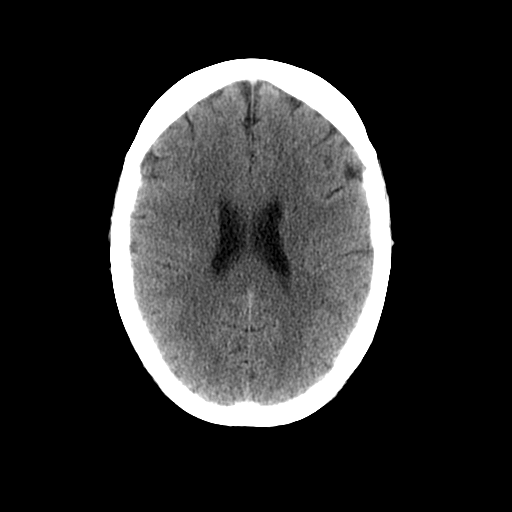
[im 17/32  bone]
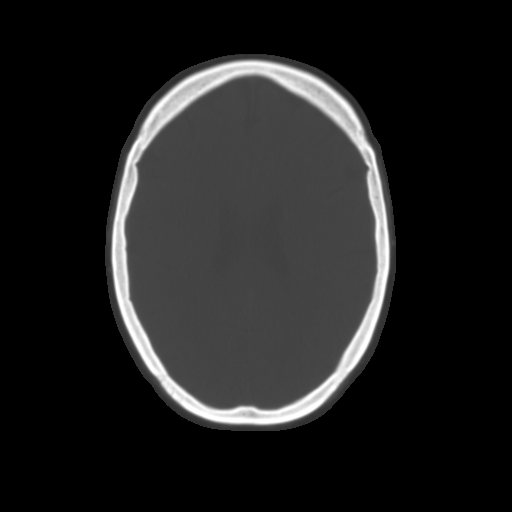
[im 19/32  brain]
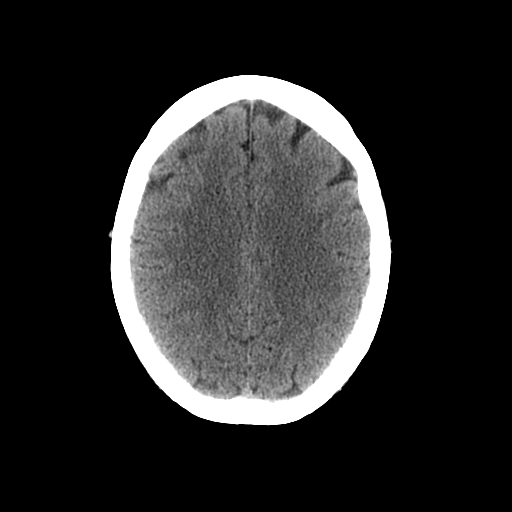
[im 21/32  brain]
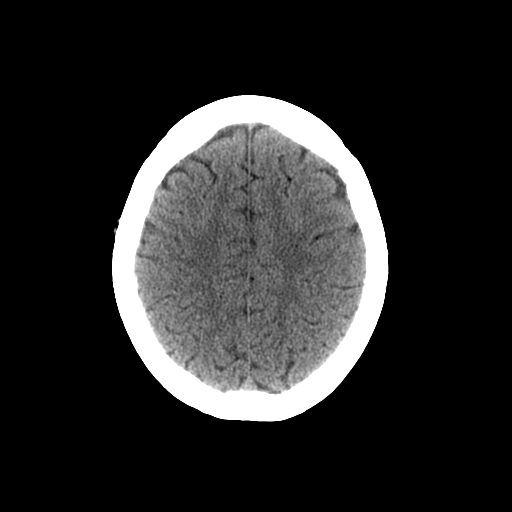
[im 23/32  brain]
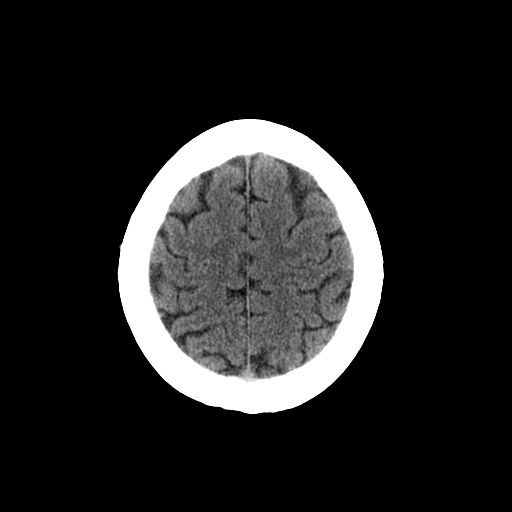
[im 24/32  brain]
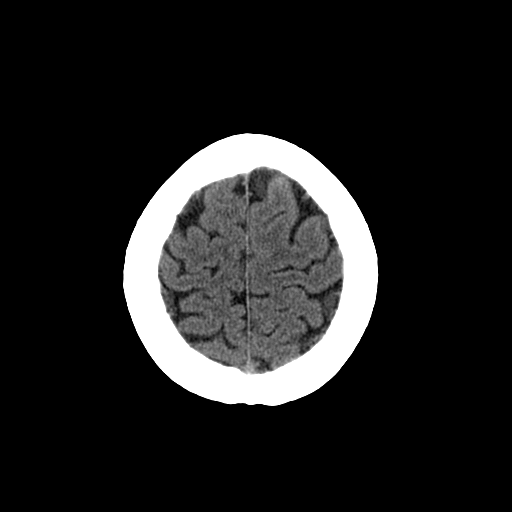
[im 24/32  bone]
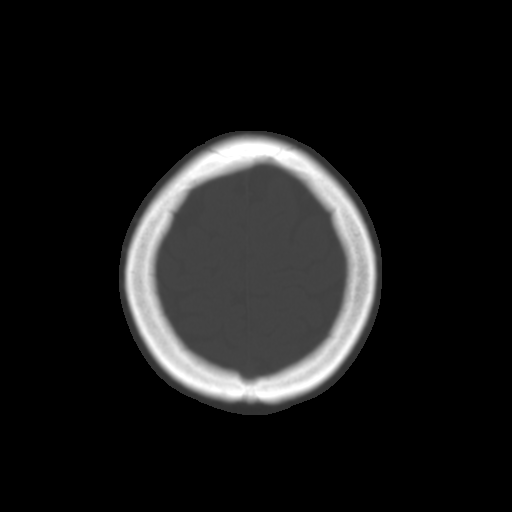
[im 26/32  brain]
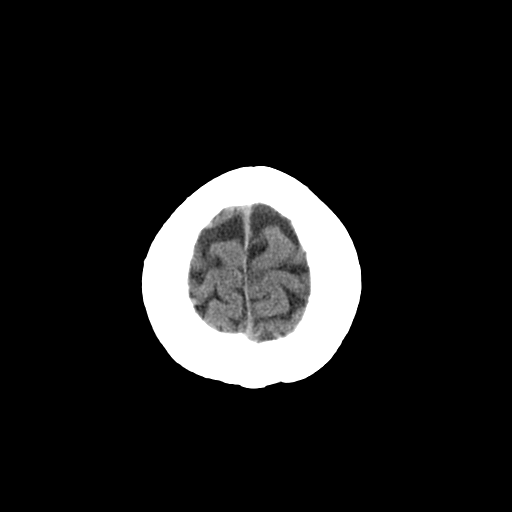
[im 28/32  brain]
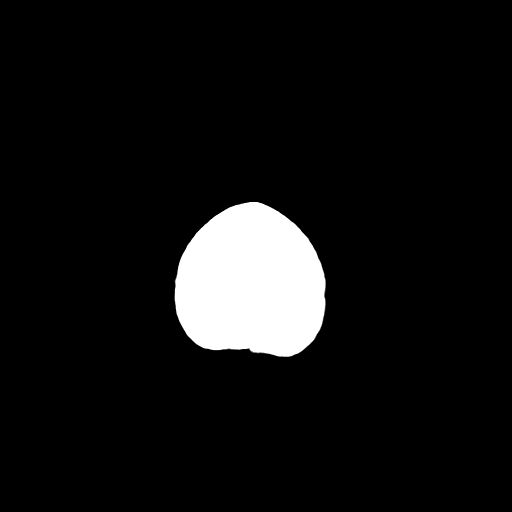
[im 30/32  brain]
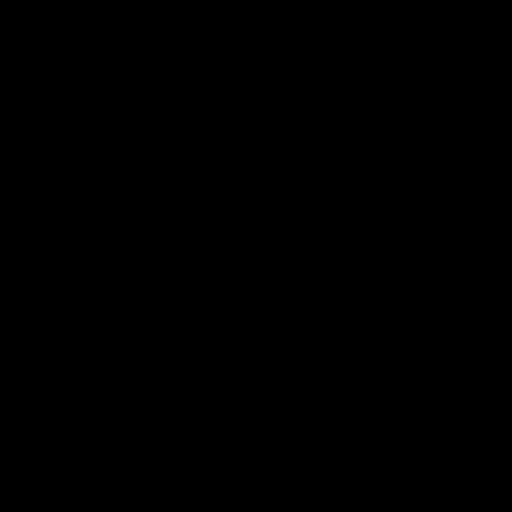

[16 of 30 positions shown; findings below may reference images not displayed]

FINDINGS: The brain has a normal appearance without evidence for
hemorrhage, infarction, hydrocephalus, or mass lesion.  There is no
extra axial fluid collection.  The skull and paranasal sinuses are
normal.
IMPRESSION: 1.  No acute intracranial abnormalities.

## 2009-02-25 ENCOUNTER — Emergency Department (HOSPITAL_COMMUNITY): Admission: EM | Admit: 2009-02-25 | Discharge: 2009-02-25 | Payer: Self-pay | Admitting: Internal Medicine

## 2009-02-25 IMAGING — CT CT ABDOMEN W/ CM
2 of 6 series · 17 of 46 positions shown, 19 images · IV contrast (APPLIED)
Comparison: None available.

CT ABDOMEN

CLINICAL DATA: Sudden onset sharp lower abdominal pain.  Nausea.

CT ABDOMEN AND PELVIS WITH CONTRAST
TECHNIQUE: Multidetector CT imaging of the abdomen and pelvis was
performed using the standard protocol following bolus
administration of intravenous contrast.
Contrast: 100 ml Omnipaque 300

[Series 2: abd/pelv with 5.0 b31s st · axial · 0.75mm/px · z∈[-656,-226]mm · 14 of 98 slices shown, 16 images]
[im 6/98  soft-tissue]
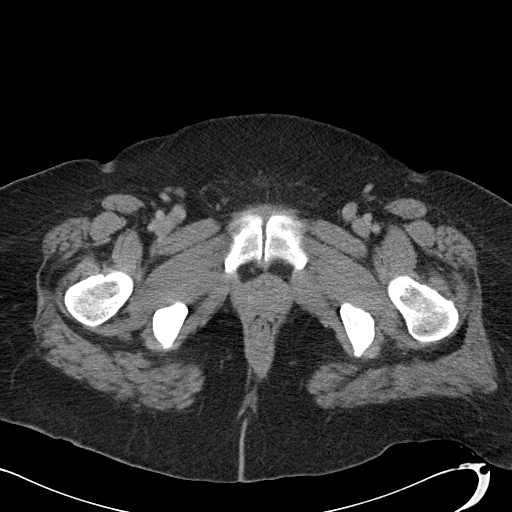
[im 6/98  bone]
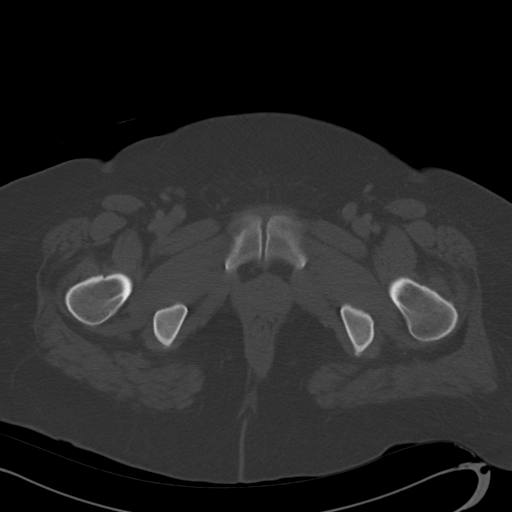
[im 11/98  soft-tissue]
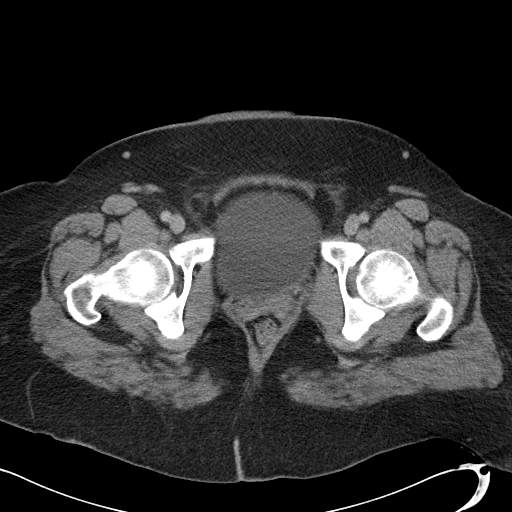
[im 22/98  soft-tissue]
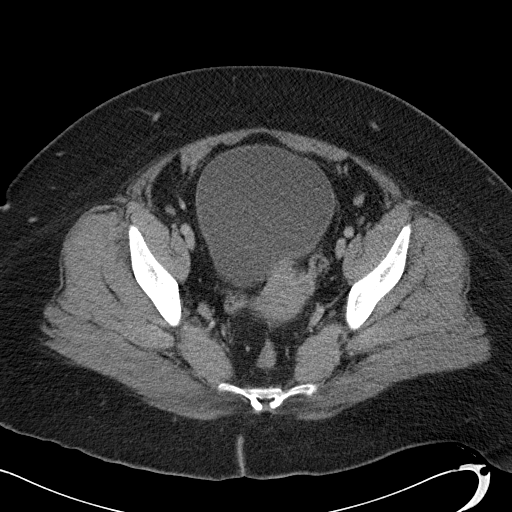
[im 27/98  soft-tissue]
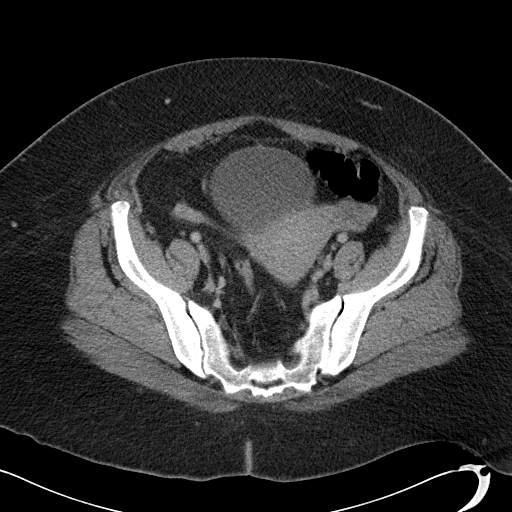
[im 33/98  soft-tissue]
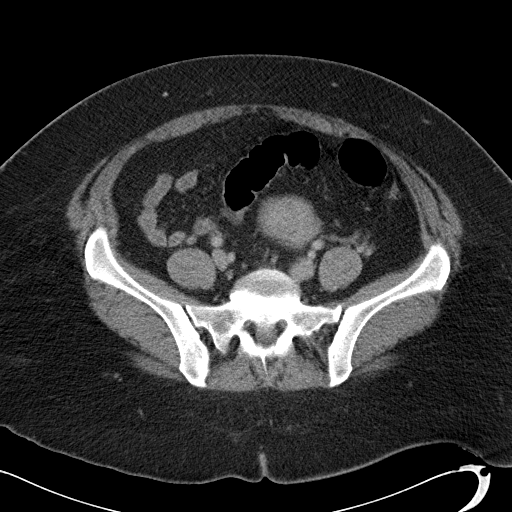
[im 38/98  soft-tissue]
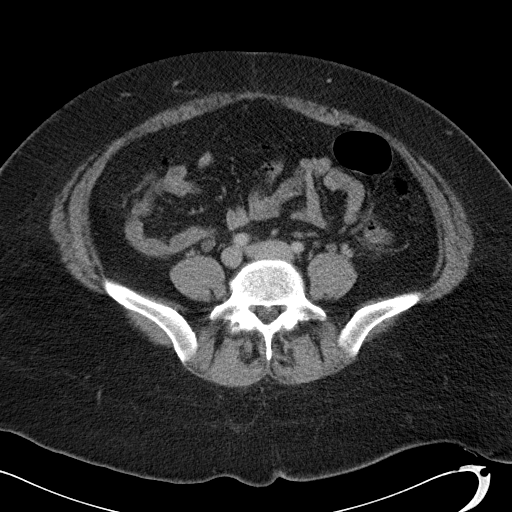
[im 44/98  soft-tissue]
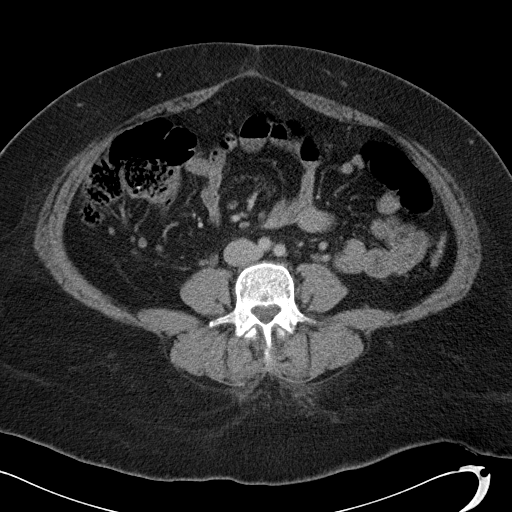
[im 54/98  soft-tissue]
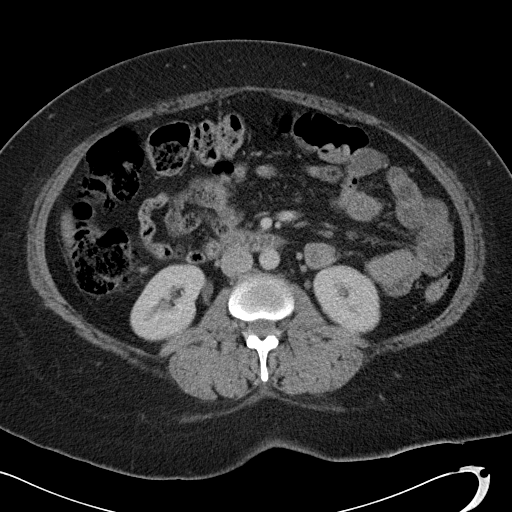
[im 60/98  soft-tissue]
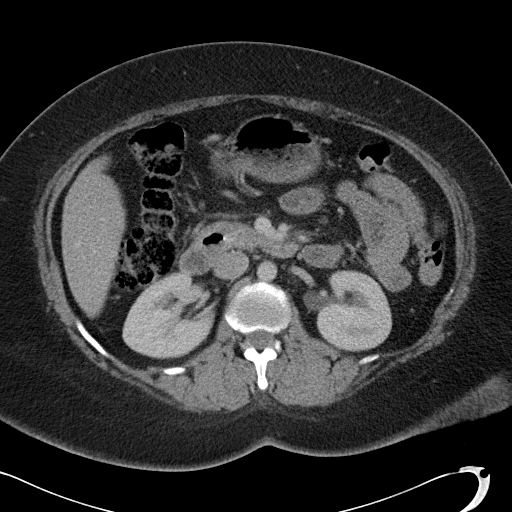
[im 60/98  bone]
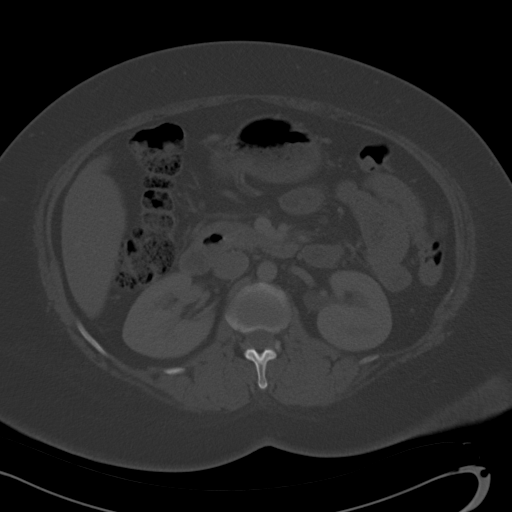
[im 65/98  soft-tissue]
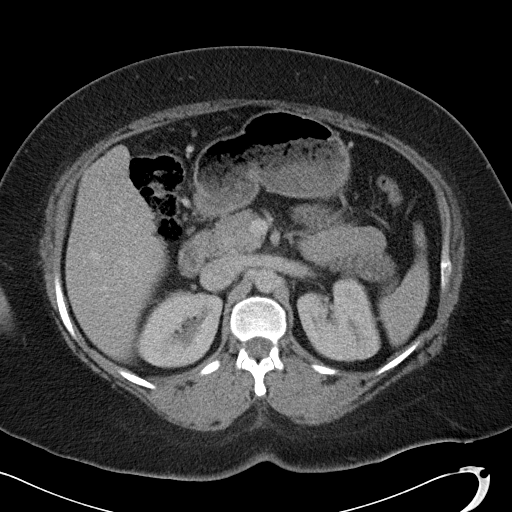
[im 71/98  soft-tissue]
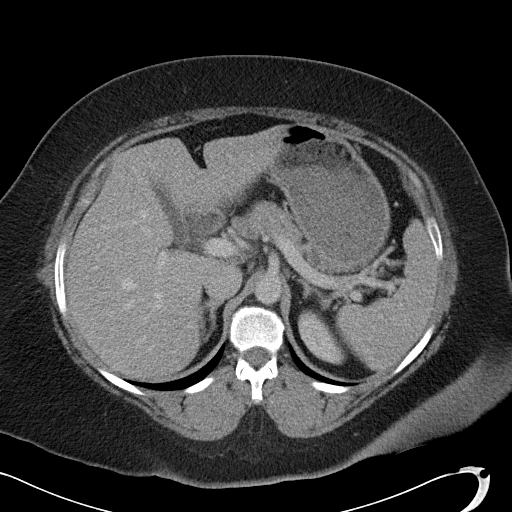
[im 76/98  soft-tissue]
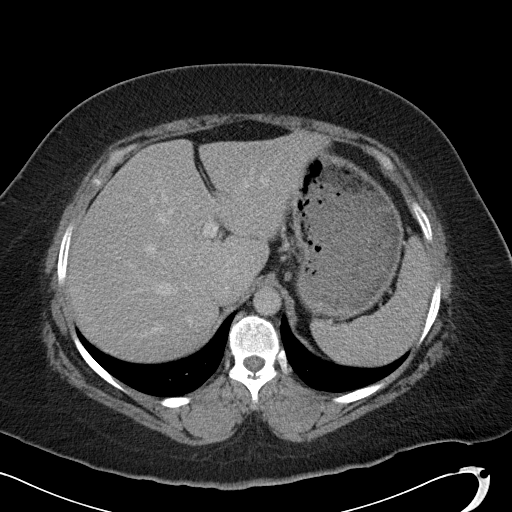
[im 87/98  soft-tissue]
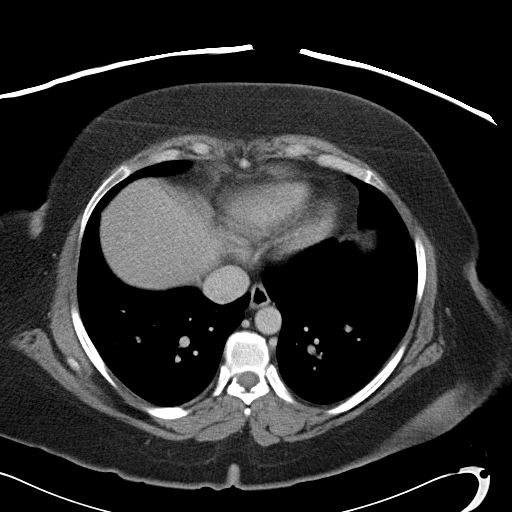
[im 92/98  soft-tissue]
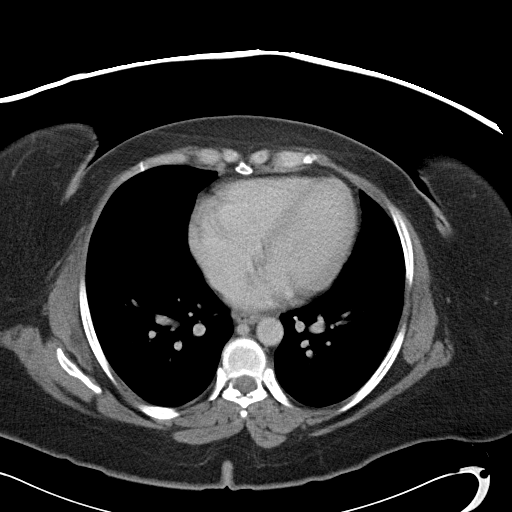

[Series 5: abd/pelv with 2.0 spo st · coronal · 0.95mm/px · 3 of 154 slices shown]
[im 52/154  soft-tissue]
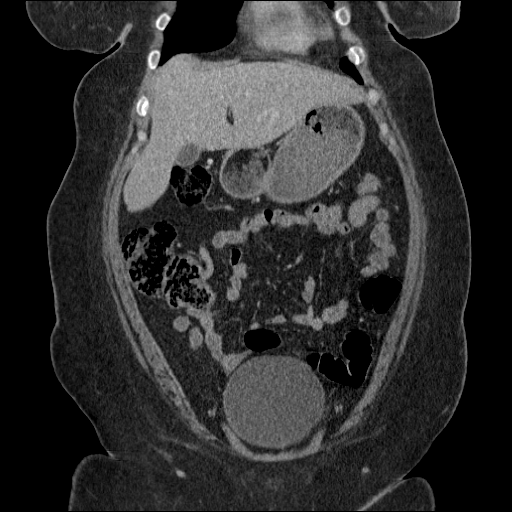
[im 69/154  soft-tissue]
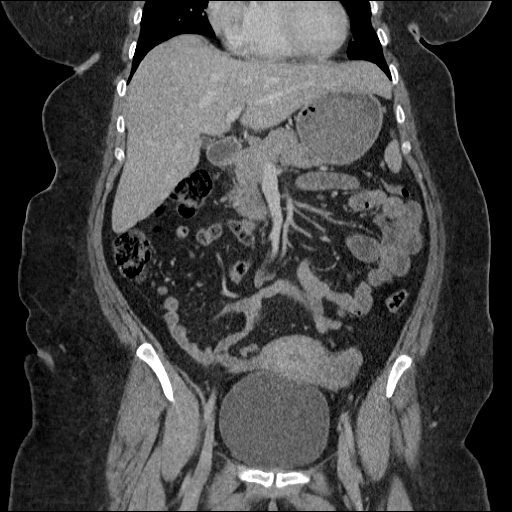
[im 86/154  soft-tissue]
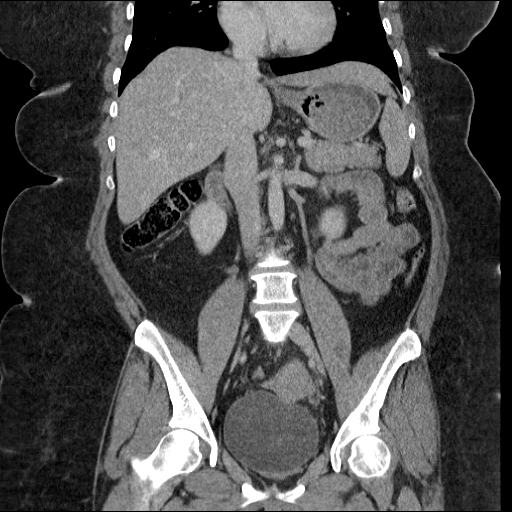

[17 of 46 positions shown; findings below may reference images not displayed]

FINDINGS: The lung bases are clear without focal nodule, mass, or
airspace disease.  The heart size is normal.  There is no
significant pleural or pericardial effusion.  The liver and spleen
are normal.  The stomach, duodenum, and pancreas are within normal
limits.  The common bile duct and gallbladder are normal.  The
adrenal glands are within normal limits bilaterally.  A 7 mm
nonobstructive stone is present in the mid pole region of the right
kidney.  The ureters are of normal size bilaterally.  The left
kidney is unremarkable.  There is no significant abdominal
lymphadenopathy or free fluid.
IMPRESSION: 1.  Nonobstructive 7 mm right kidney stent.
2.  No acute abnormality of the abdomen.

CT PELVIS
FINDINGS: The rectosigmoid colon is within normal limits.  The
remainder of the colon is unremarkable.  The appendix is not
definitively identified.  Small lymph nodes are present within the
ileocolic ligament.  There is no definite evidence for
appendicitis.  The uterus and adnexa are within normal limits for
age.  Urinary bladder is unremarkable.  There is no significant
pelvic lymphadenopathy or free fluid.  Bone windows reveal mild
degenerative changes of the SI joints.  No focal lytic or blastic
lesions are evident.
IMPRESSION: 1.  No acute abnormality of the pelvis.
2.  The appendix is not definitively identified, but no significant
secondary inflammatory changes are evident.

## 2011-02-01 LAB — BASIC METABOLIC PANEL
BUN: 7 mg/dL (ref 6–23)
CO2: 28 mEq/L (ref 19–32)
Calcium: 8.6 mg/dL (ref 8.4–10.5)
Chloride: 106 mEq/L (ref 96–112)
Creatinine, Ser: 0.62 mg/dL (ref 0.4–1.2)
GFR calc Af Amer: >60 mL/min (ref >60)
GFR calc non Af Amer: >60 mL/min (ref >60)
Glucose, Bld: 120 mg/dL — ABNORMAL HIGH (ref 70–99)
Potassium: 3.7 mEq/L (ref 3.5–5.1)
Sodium: 139 mEq/L (ref 135–145)

## 2011-02-01 LAB — URINALYSIS, ROUTINE W REFLEX MICROSCOPIC
Bilirubin Urine: NEGATIVE
Glucose, UA: NEGATIVE mg/dL
Hgb urine dipstick: NEGATIVE
Ketones, ur: NEGATIVE mg/dL
Nitrite: NEGATIVE
Protein, ur: NEGATIVE mg/dL
Specific Gravity, Urine: 1.015 (ref 1.005–1.030)
Urobilinogen, UA: 1 mg/dL (ref 0.0–1.0)
pH: 7.5 (ref 5.0–8.0)

## 2011-02-01 LAB — CBC
HCT: 37.6 % (ref 36.0–46.0)
Hemoglobin: 13.2 g/dL (ref 12.0–15.0)
MCHC: 35.1 g/dL (ref 30.0–36.0)
MCV: 81.6 fL (ref 78.0–100.0)
Platelets: 259 10*3/uL (ref 150–400)
RBC: 4.61 MIL/uL (ref 3.87–5.11)
RDW: 13.4 % (ref 11.5–15.5)
WBC: 6.1 10*3/uL (ref 4.0–10.5)

## 2011-02-01 LAB — POCT PREGNANCY, URINE: Preg Test, Ur: NEGATIVE

## 2011-02-01 LAB — DIFFERENTIAL
Basophils Absolute: 0 10*3/uL (ref 0.0–0.1)
Basophils Relative: 0 % (ref 0–1)
Eosinophils Absolute: 0.2 10*3/uL (ref 0.0–0.7)
Eosinophils Relative: 3 % (ref 0–5)
Lymphocytes Relative: 33 % (ref 12–46)
Lymphs Abs: 2 10*3/uL (ref 0.7–4.0)
Monocytes Absolute: 0.6 10*3/uL (ref 0.1–1.0)
Monocytes Relative: 10 % (ref 3–12)
Neutro Abs: 3.3 10*3/uL (ref 1.7–7.7)
Neutrophils Relative %: 54 % (ref 43–77)

## 2011-07-12 ENCOUNTER — Other Ambulatory Visit: Payer: Self-pay | Admitting: Internal Medicine

## 2011-07-12 DIAGNOSIS — G932 Benign intracranial hypertension: Secondary | ICD-10-CM

## 2011-07-14 ENCOUNTER — Ambulatory Visit
Admission: RE | Admit: 2011-07-14 | Discharge: 2011-07-14 | Disposition: A | Discharge status: Home / Self Care | Payer: Medicaid Other | Source: Ambulatory Visit | Attending: Internal Medicine | Admitting: Internal Medicine

## 2011-07-14 DIAGNOSIS — G932 Benign intracranial hypertension: Secondary | ICD-10-CM

## 2011-07-14 IMAGING — CT CT HEAD W/O CM
2 series · 16 of 30 positions shown, 20 images · non-contrast
Comparison: None

CLINICAL DATA: Rule out pseudotumor.  Intracranial pressure.

CT HEAD WITHOUT CONTRAST
TECHNIQUE: Contiguous axial images were obtained from the base of
the skull through the vertex without contrast.

[Series 2: head w/o · axial · non-contrast · 0.49mm/px · z∈[+14,+136]mm · 13 of 28 slices shown, 17 images]
[im 2/28  brain]
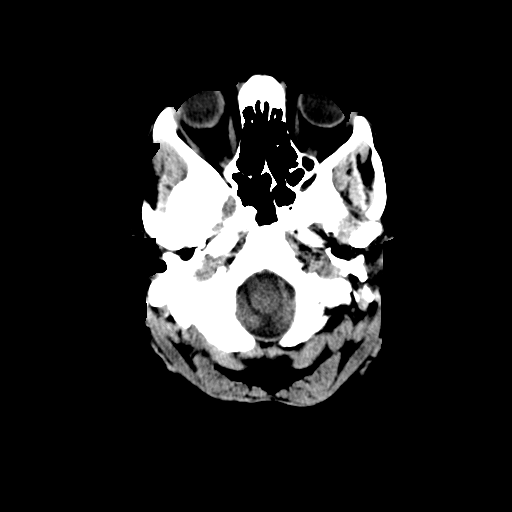
[im 2/28  bone]
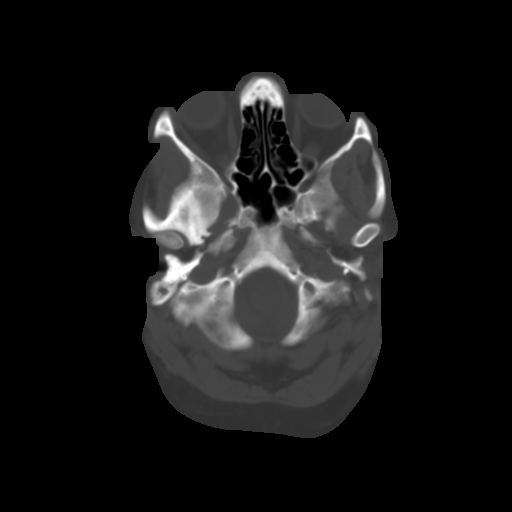
[im 4/28  brain]
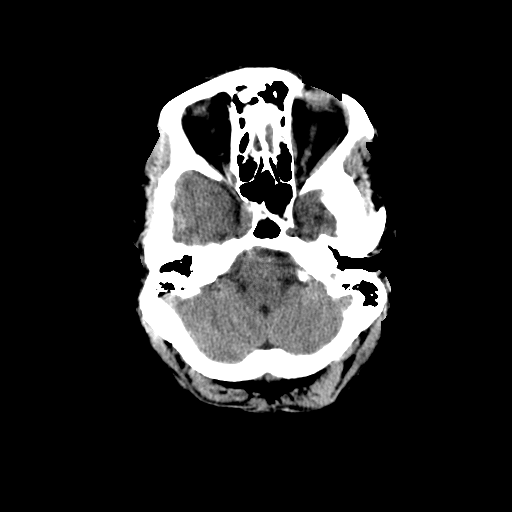
[im 6/28  brain]
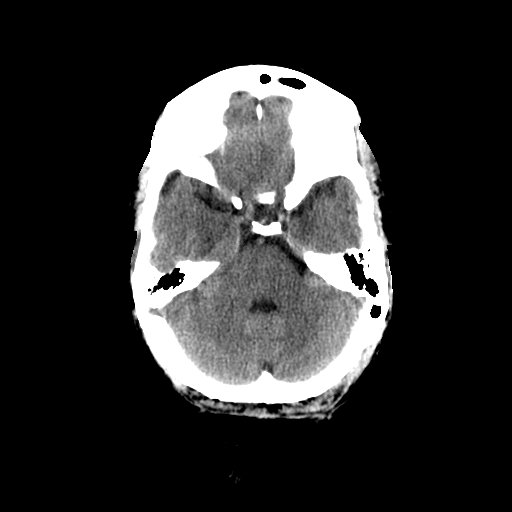
[im 8/28  brain]
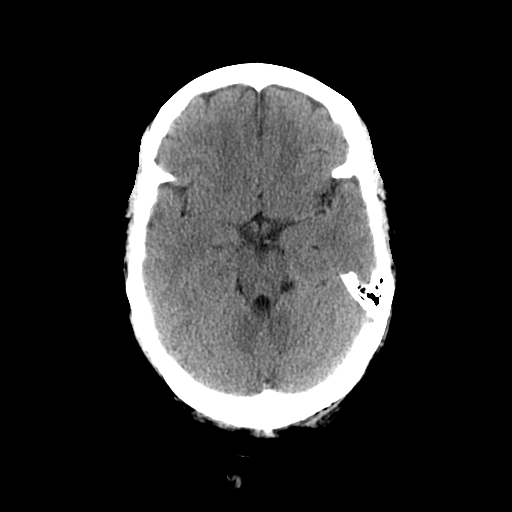
[im 10/28  brain]
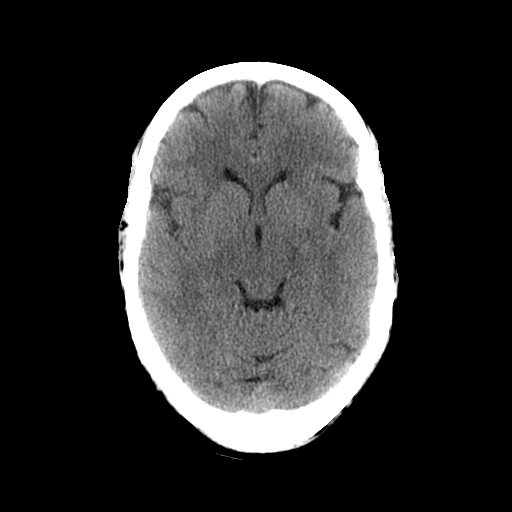
[im 10/28  bone]
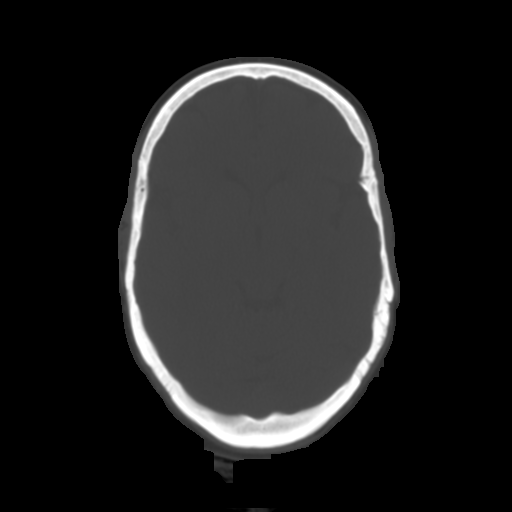
[im 12/28  brain]
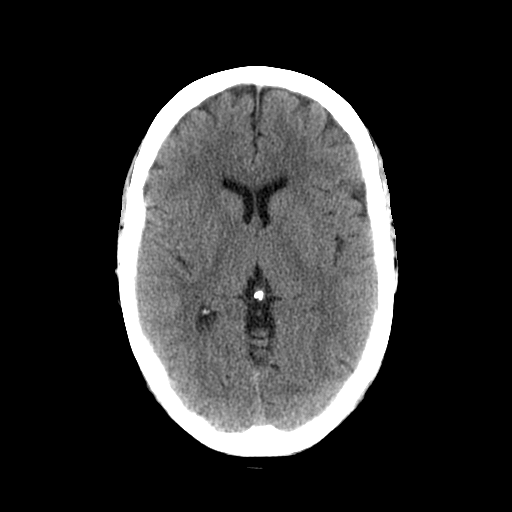
[im 14/28  brain]
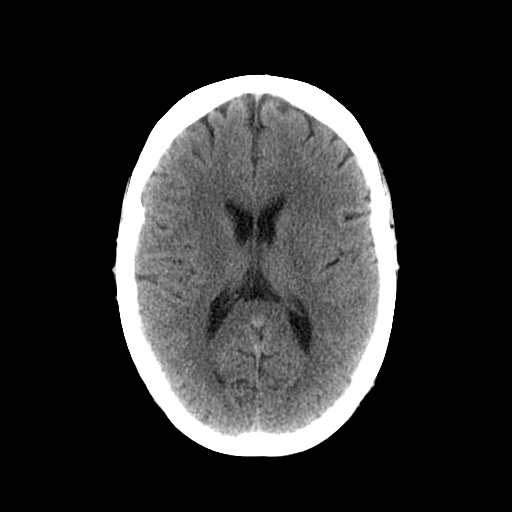
[im 16/28  brain]
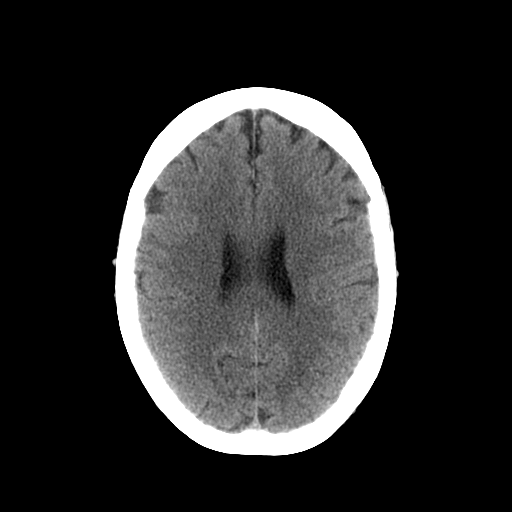
[im 18/28  brain]
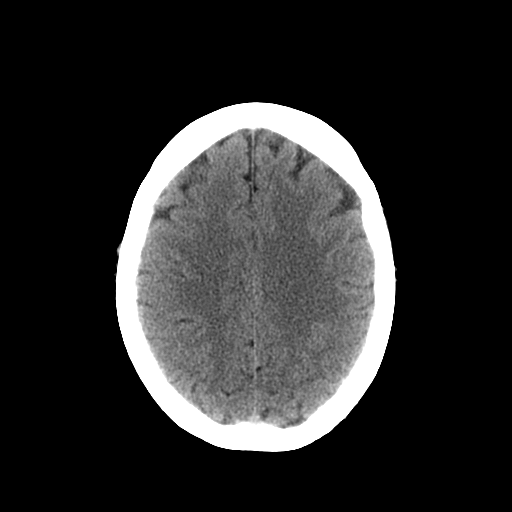
[im 18/28  bone]
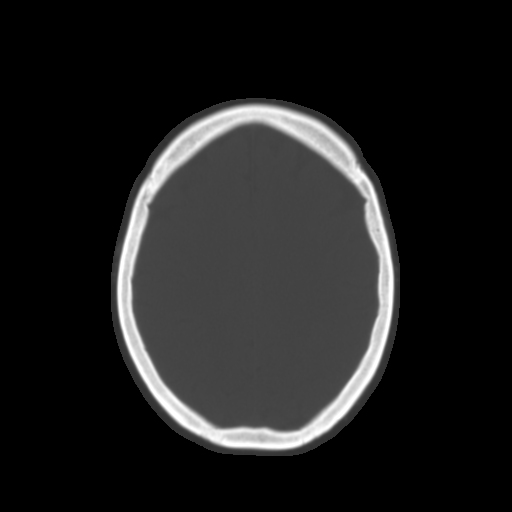
[im 20/28  brain]
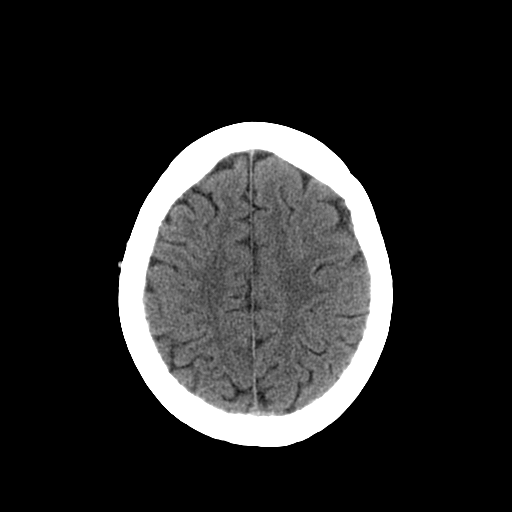
[im 22/28  brain]
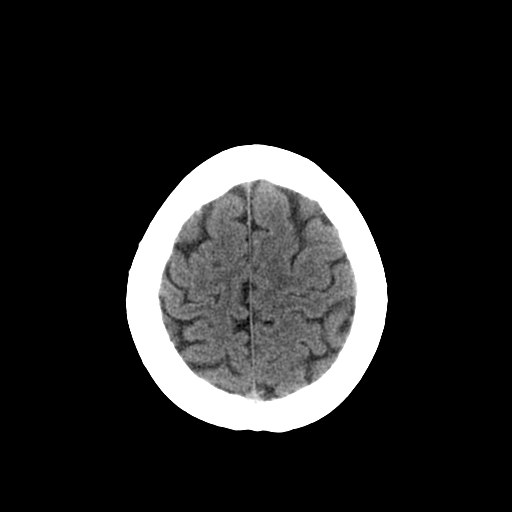
[im 24/28  brain]
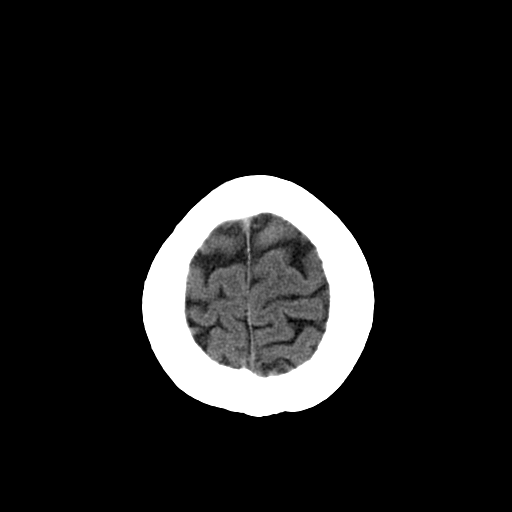
[im 26/28  brain]
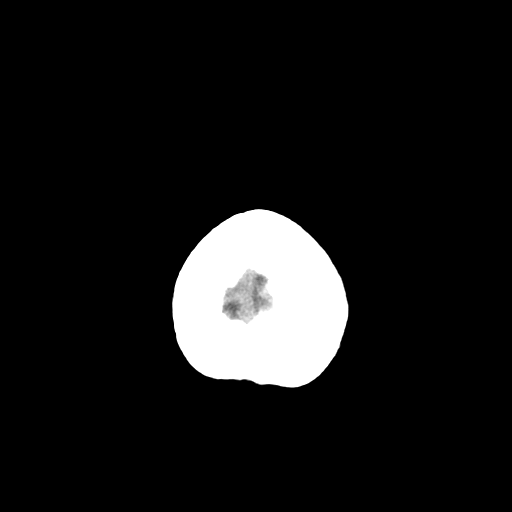
[im 26/28  bone]
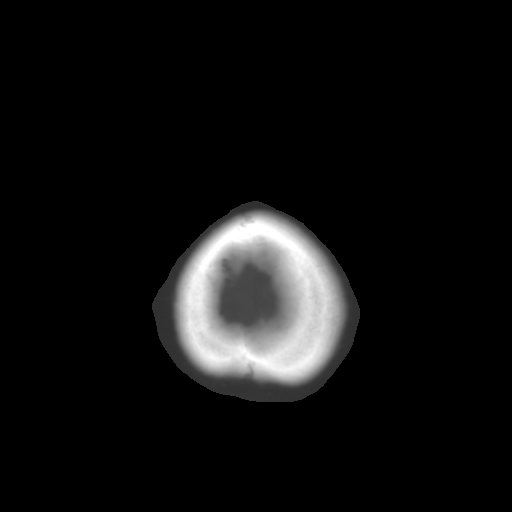

[Series 3: head bone · axial · 0.49mm/px · z∈[+14,+55]mm · 3 of 28 slices shown]
[im 2/28  bone]
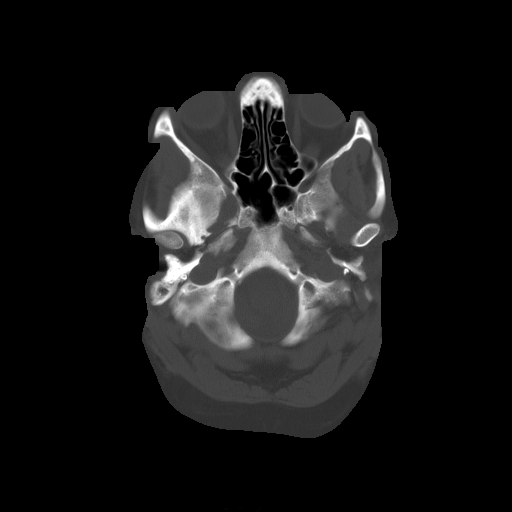
[im 6/28  bone]
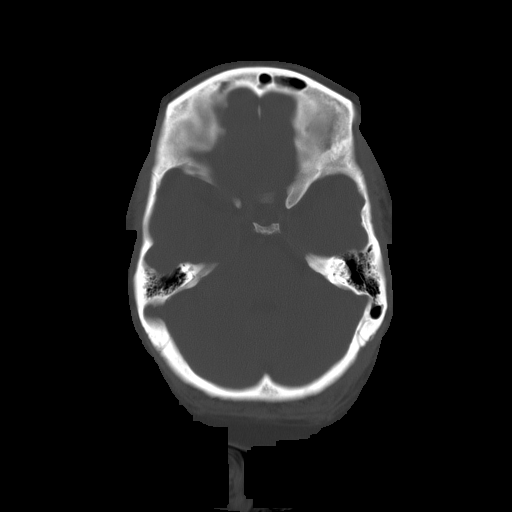
[im 10/28  bone]
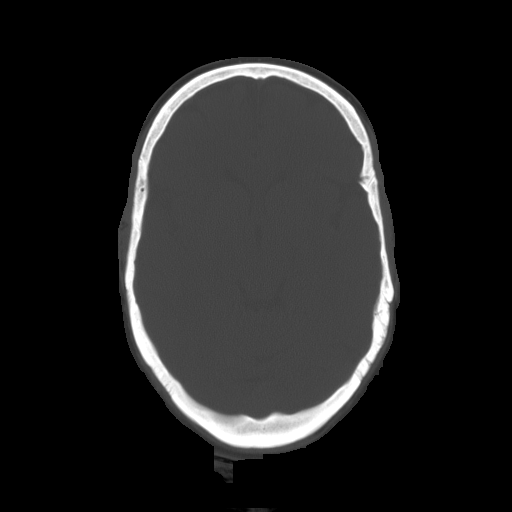

[16 of 30 positions shown; findings below may reference images not displayed]

FINDINGS: Ventricle size is normal.  Negative for infarct.
Negative for hemorrhage, mass, or edema.  The brain appears normal.
Calvarium is intact.
IMPRESSION: Normal study.

## 2011-08-25 ENCOUNTER — Inpatient Hospital Stay (INDEPENDENT_AMBULATORY_CARE_PROVIDER_SITE_OTHER)
Admission: RE | Admit: 2011-08-25 | Discharge: 2011-08-25 | Disposition: A | Discharge status: Home / Self Care | Payer: Medicaid Other | Source: Ambulatory Visit | Attending: Emergency Medicine | Admitting: Emergency Medicine

## 2011-08-25 DIAGNOSIS — H6120 Impacted cerumen, unspecified ear: Secondary | ICD-10-CM

## 2012-01-06 ENCOUNTER — Other Ambulatory Visit: Payer: Self-pay | Admitting: Internal Medicine

## 2012-01-06 DIAGNOSIS — G932 Benign intracranial hypertension: Secondary | ICD-10-CM

## 2012-01-10 ENCOUNTER — Other Ambulatory Visit: Payer: Self-pay | Admitting: *Deleted

## 2012-01-10 ENCOUNTER — Other Ambulatory Visit: Payer: Self-pay | Admitting: Internal Medicine

## 2012-01-10 DIAGNOSIS — G932 Benign intracranial hypertension: Secondary | ICD-10-CM

## 2012-01-11 ENCOUNTER — Ambulatory Visit
Admission: RE | Admit: 2012-01-11 | Discharge: 2012-01-11 | Disposition: A | Discharge status: Home / Self Care | Payer: Self-pay | Source: Ambulatory Visit | Attending: Internal Medicine | Admitting: Internal Medicine

## 2012-01-11 ENCOUNTER — Other Ambulatory Visit: Payer: Self-pay

## 2012-01-11 DIAGNOSIS — G932 Benign intracranial hypertension: Secondary | ICD-10-CM

## 2012-01-11 NOTE — Discharge Instructions (Addendum)

## 2012-02-03 ENCOUNTER — Emergency Department (HOSPITAL_COMMUNITY)
Admission: EM | Admit: 2012-02-03 | Discharge: 2012-02-03 | Disposition: A | Discharge status: Home / Self Care | Payer: Medicaid Other | Attending: Emergency Medicine | Admitting: Emergency Medicine

## 2012-02-03 ENCOUNTER — Encounter (HOSPITAL_COMMUNITY): Payer: Self-pay | Admitting: Emergency Medicine

## 2012-02-03 DIAGNOSIS — R42 Dizziness and giddiness: Secondary | ICD-10-CM

## 2012-02-03 HISTORY — DX: Hypotension, unspecified: I95.9

## 2012-02-03 HISTORY — DX: Unspecified abnormal finding in cerebrospinal fluid: R83.9

## 2012-02-03 HISTORY — DX: Unqualified visual loss, both eyes: H54.3

## 2012-02-03 LAB — URINALYSIS, ROUTINE W REFLEX MICROSCOPIC
Bilirubin Urine: NEGATIVE
Glucose, UA: NEGATIVE mg/dL
Ketones, ur: NEGATIVE mg/dL
Leukocytes, UA: NEGATIVE
Nitrite: NEGATIVE
Protein, ur: NEGATIVE mg/dL
Specific Gravity, Urine: 1.008 (ref 1.005–1.030)
Urobilinogen, UA: 0.2 mg/dL (ref 0.0–1.0)
pH: 7 (ref 5.0–8.0)

## 2012-02-03 LAB — BASIC METABOLIC PANEL
BUN: 7 mg/dL (ref 6–23)
CO2: 24 mEq/L (ref 19–32)
Calcium: 8.8 mg/dL (ref 8.4–10.5)
Chloride: 105 mEq/L (ref 96–112)
Creatinine, Ser: 0.56 mg/dL (ref 0.50–1.10)
GFR calc Af Amer: >90 mL/min (ref >90)
GFR calc non Af Amer: >90 mL/min (ref >90)
Glucose, Bld: 98 mg/dL (ref 70–99)
Potassium: 3.4 mEq/L — ABNORMAL LOW (ref 3.5–5.1)
Sodium: 139 mEq/L (ref 135–145)

## 2012-02-03 LAB — CBC
HCT: 43.7 % (ref 36.0–46.0)
Hemoglobin: 14.4 g/dL (ref 12.0–15.0)
MCH: 27.2 pg (ref 26.0–34.0)
MCHC: 33 g/dL (ref 30.0–36.0)
MCV: 82.6 fL (ref 78.0–100.0)
Platelets: 336 10*3/uL (ref 150–400)
RBC: 5.29 MIL/uL — ABNORMAL HIGH (ref 3.87–5.11)
RDW: 13.7 % (ref 11.5–15.5)
WBC: 6.3 10*3/uL (ref 4.0–10.5)

## 2012-02-03 LAB — URINE MICROSCOPIC-ADD ON

## 2012-02-03 LAB — TROPONIN I: Troponin I: <0.3 ng/mL (ref <0.30)

## 2012-02-03 MED ORDER — SODIUM CHLORIDE 0.9 % IV BOLUS (SEPSIS)
1000.0000 mL | Freq: Once | INTRAVENOUS | Status: AC
  Administered 2012-02-03: 1000 mL via INTRAVENOUS

## 2012-02-03 MED ORDER — TRAMADOL HCL 50 MG PO TABS: 50.0000 mg | ORAL_TABLET | Freq: Four times a day (QID) | ORAL | Status: AC | PRN

## 2012-02-03 NOTE — ED Notes (Signed)
Pt presenting to ed with c/o blood pressure dropping to 57/33 per daughter at bedside. Pt spoke with pcp today and was told to present to ed. Pt denies chest pain and  shortness of breath. Pt reports positive nausea and vomiting last night

## 2012-02-03 NOTE — ED Notes (Addendum)
Blood pressure "dropped" per the family and was sent by PMD for evaluation.  Pt had c/o dizziness and h/a and family took BP saying it was 57/33.  They gave her coffee and salt water and still c/o dizziness and "like someone is pulling the back of her head" Pt had CSF removed on Jan 11, 2012.  Has this done usually yearly b/c her body makes too much of it.

## 2012-02-03 NOTE — Discharge Instructions (Signed)
Dizziness Dizziness is a common problem. It is a feeling of unsteadiness or lightheadedness. You may feel like you are about to faint. Dizziness can lead to injury if you stumble or fall. A person of any age group can suffer from dizziness, but dizziness is more common in older adults. CAUSES  Dizziness can be caused by many different things, including:  Middle ear problems.   Standing for too long.   Infections.   An allergic reaction.   Aging.   An emotional response to something, such as the sight of blood.   Side effects of medicines.   Fatigue.   Problems with circulation or blood pressure.   Excess use of alcohol, medicines, or illegal drug use.   Breathing too fast (hyperventilation).   An arrhythmia or problems with your heart rhythm.   Low red blood cell count (anemia).   Pregnancy.   Vomiting, diarrhea, fever, or other illnesses that cause dehydration.   Diseases or conditions such as Parkinson's disease, high blood pressure (hypertension), diabetes, and thyroid problems.   Exposure to extreme heat.  DIAGNOSIS  To find the cause of your dizziness, your caregiver may do a physical exam, lab tests, radiologic imaging scans, or an electrocardiography test (ECG).  TREATMENT  Treatment of dizziness depends on the cause of your symptoms and can vary greatly. HOME CARE INSTRUCTIONS   Drink enough fluids to keep your urine clear or pale yellow. This is especially important in very hot weather. In the elderly, it is also important in cold weather.   If your dizziness is caused by medicines, take them exactly as directed. When taking blood pressure medicines, it is especially important to get up slowly.   Rise slowly from chairs and steady yourself until you feel okay.   In the morning, first sit up on the side of the bed. When this seems okay, stand slowly while holding onto something until you know your balance is fine.   If you need to stand in one place for a  long time, be sure to move your legs often. Tighten and relax the muscles in your legs while standing.   If dizziness continues to be a problem, have someone stay with you for a day or two. Do this until you feel you are well enough to stay alone. Have the person call your caregiver if he or she notices changes in you that are concerning.   Do not drive or use heavy machinery if you feel dizzy.  SEEK IMMEDIATE MEDICAL CARE IF:   Your dizziness or lightheadedness gets worse.   You feel nauseous or vomit.   You develop problems with talking, walking, weakness, or using your arms, hands, or legs.   You are not thinking clearly or you have difficulty forming sentences. It may take a friend or family member to determine if your thinking is normal.   You develop chest pain, abdominal pain, shortness of breath, or sweating.   Your vision changes.   You notice any bleeding.   You have side effects from medicine that seems to be getting worse rather than better.  MAKE SURE YOU:   Understand these instructions.   Will watch your condition.   Will get help right away if you are not doing well or get worse.  Document Released: 04/05/2001 Document Revised: 09/29/2011 Document Reviewed: 04/29/2011 [facility] Patient Information 2012 [place], [place].Dizziness Dizziness is a common problem. It is a feeling of unsteadiness or lightheadedness. You may feel like you are about  to faint. Dizziness can lead to injury if you stumble or fall. A person of any age group can suffer from dizziness, but dizziness is more common in older adults. CAUSES  Dizziness can be caused by many different things, including:  Middle ear problems.   Standing for too long.   Infections.   An allergic reaction.   Aging.   An emotional response to something, such as the sight of blood.   Side effects of medicines.   Fatigue.   Problems with circulation or blood pressure.   Excess use of alcohol, medicines, or  illegal drug use.   Breathing too fast (hyperventilation).   An arrhythmia or problems with your heart rhythm.   Low red blood cell count (anemia).   Pregnancy.   Vomiting, diarrhea, fever, or other illnesses that cause dehydration.   Diseases or conditions such as Parkinson's disease, high blood pressure (hypertension), diabetes, and thyroid problems.   Exposure to extreme heat.  DIAGNOSIS  To find the cause of your dizziness, your caregiver may do a physical exam, lab tests, radiologic imaging scans, or an electrocardiography test (ECG).  TREATMENT  Treatment of dizziness depends on the cause of your symptoms and can vary greatly. HOME CARE INSTRUCTIONS   Drink enough fluids to keep your urine clear or pale yellow. This is especially important in very hot weather. In the elderly, it is also important in cold weather.   If your dizziness is caused by medicines, take them exactly as directed. When taking blood pressure medicines, it is especially important to get up slowly.   Rise slowly from chairs and steady yourself until you feel okay.   In the morning, first sit up on the side of the bed. When this seems okay, stand slowly while holding onto something until you know your balance is fine.   If you need to stand in one place for a long time, be sure to move your legs often. Tighten and relax the muscles in your legs while standing.   If dizziness continues to be a problem, have someone stay with you for a day or two. Do this until you feel you are well enough to stay alone. Have the person call your caregiver if he or she notices changes in you that are concerning.   Do not drive or use heavy machinery if you feel dizzy.  SEEK IMMEDIATE MEDICAL CARE IF:   Your dizziness or lightheadedness gets worse.   You feel nauseous or vomit.   You develop problems with talking, walking, weakness, or using your arms, hands, or legs.   You are not thinking clearly or you have  difficulty forming sentences. It may take a friend or family member to determine if your thinking is normal.   You develop chest pain, abdominal pain, shortness of breath, or sweating.   Your vision changes.   You notice any bleeding.   You have side effects from medicine that seems to be getting worse rather than better.  MAKE SURE YOU:   Understand these instructions.   Will watch your condition.   Will get help right away if you are not doing well or get worse.  Document Released: 04/05/2001 Document Revised: 09/29/2011 Document Reviewed: 04/29/2011 [facility] Patient Information 2012 [place], [place].

## 2012-02-03 NOTE — ED Notes (Signed)
Reports feeling dizzy last night and feeling like something pulling at her head, took bp and bp was low in the 50s over 30s. Today pt complains of weakness and not feeling well and low BP.

## 2012-02-14 NOTE — ED Provider Notes (Signed)
History    46 year old female with dizziness. Describes as a sensation that he may pass out. Intermittent. Has been ongoing issue for patient for over a year. Seen by her primary care Dr. today and noted to be hypotensive. Referred to emergency room for further evaluation. Currently feels better. Denies pain anywhere. No fevers or chills. No urinary complaints. Patient has a history of blindness of what sounds like pseudotumor cerebri. No acute neurological complaints otherwise. Hx via family translating.   CSN: [id number]  Arrival date & time 02/03/12  1718   First MD Initiated Contact with Patient 02/03/12 1949      Chief Complaint  Patient presents with  . Blood Pressure Check  . Dizziness    (Consider location/radiation/quality/duration/timing/severity/associated sxs/prior treatment) HPI  Past Medical History  Diagnosis Date  . Abnormal finding in CSF   . Low blood pressure   . Blindness - both eyes     Past Surgical History  Procedure Date  . Tonsillectomy     No family history on file.  History  Substance Use Topics  . Smoking status: Never Smoker   . Smokeless tobacco: Not on file  . Alcohol Use: No    OB History    Grav Para Term Preterm Abortions TAB SAB Ect Mult Living                  Review of Systems   Review of symptoms negative unless otherwise noted in HPI.   Allergies  Review of patient's allergies indicates no known allergies.  Home Medications   Current Outpatient Rx  Name Route Sig Dispense Refill  . HAIR/SKIN/NAILS PO TABS Oral Take 1 tablet by mouth daily.    . TRAMADOL HCL 50 MG PO TABS Oral Take 1 tablet (50 mg total) by mouth every 6 (six) hours as needed for pain. 15 tablet 0    BP 117/67  Pulse 64  Temp(Src) 97.6 F (36.4 C) (Oral)  Resp 19  SpO2 100%  LMP 01/28/2012  Physical Exam  Nursing note and vitals reviewed. Constitutional: She appears well-developed and well-nourished. No distress.       Laying in bed. No  acute distress. Obese.  HENT:  Head: Normocephalic and atraumatic.  Eyes: Conjunctivae are normal. Right eye exhibits no discharge. Left eye exhibits no discharge.  Neck: Normal range of motion. Neck supple.  Cardiovascular: Normal rate, regular rhythm and normal heart sounds.  Exam reveals no gallop and no friction rub.   No murmur heard. Pulmonary/Chest: Effort normal and breath sounds normal. No respiratory distress.  Abdominal: Soft. She exhibits no distension. There is no tenderness.  Musculoskeletal: She exhibits no edema and no tenderness.  Neurological: She is alert. She exhibits normal muscle tone. Coordination normal.  Skin: Skin is warm and dry. She is not diaphoretic.  Psychiatric: She has a normal mood and affect. Her behavior is normal. Thought content normal.    ED Course  Procedures (including critical care time)  Labs Reviewed  CBC - Abnormal; Notable for the following:    RBC 5.29 (*)    All other components within normal limits  BASIC METABOLIC PANEL - Abnormal; Notable for the following:    Potassium 3.4 (*)    All other components within normal limits  URINALYSIS, ROUTINE W REFLEX MICROSCOPIC - Abnormal; Notable for the following:    Hgb urine dipstick SMALL (*)    All other components within normal limits  TROPONIN I  URINE MICROSCOPIC-ADD ON  LAB REPORT -  SCANNED   No results found.   1. Dizziness - light-headed       MDM  46 year old female with dizziness and lightheadedness. Apparently hypotensive prior to arrival. Patient has been normotensive in the emergency room. She is unsure for several hours with no significant change in her status. Has a nonfocal neurological examination. Set her baseline mental status per her family. EKG now provocative. Workup unremarkable.        Raeford Razor, MD 02/14/12 [phone]

## 2013-07-03 ENCOUNTER — Other Ambulatory Visit (HOSPITAL_COMMUNITY): Payer: Self-pay | Admitting: Internal Medicine

## 2013-07-03 DIAGNOSIS — Z1231 Encounter for screening mammogram for malignant neoplasm of breast: Secondary | ICD-10-CM

## 2013-07-05 ENCOUNTER — Ambulatory Visit (HOSPITAL_COMMUNITY)
Admission: RE | Admit: 2013-07-05 | Discharge: 2013-07-05 | Disposition: A | Discharge status: Home / Self Care | Payer: Medicaid Other | Source: Ambulatory Visit | Attending: Internal Medicine | Admitting: Internal Medicine

## 2013-07-05 DIAGNOSIS — Z1231 Encounter for screening mammogram for malignant neoplasm of breast: Secondary | ICD-10-CM | POA: Insufficient documentation

## 2014-05-14 ENCOUNTER — Other Ambulatory Visit: Payer: Self-pay | Admitting: Internal Medicine

## 2014-05-14 DIAGNOSIS — G932 Benign intracranial hypertension: Secondary | ICD-10-CM

## 2014-05-19 ENCOUNTER — Ambulatory Visit
Admission: RE | Admit: 2014-05-19 | Discharge: 2014-05-19 | Disposition: A | Discharge status: Home / Self Care | Payer: Medicare Other | Source: Ambulatory Visit | Attending: Internal Medicine | Admitting: Internal Medicine

## 2014-05-19 VITALS — Wt 290.0 lb

## 2014-05-19 DIAGNOSIS — R839 Unspecified abnormal finding in cerebrospinal fluid: Secondary | ICD-10-CM

## 2014-05-19 DIAGNOSIS — G932 Benign intracranial hypertension: Secondary | ICD-10-CM

## 2014-05-19 NOTE — Discharge Instructions (Signed)

## 2014-08-15 ENCOUNTER — Observation Stay (HOSPITAL_COMMUNITY)
Admission: EM | Admit: 2014-08-15 | Discharge: 2014-08-16 | Disposition: A | Discharge status: Home / Self Care | Payer: Medicaid Other | Attending: Family Medicine | Admitting: Family Medicine

## 2014-08-15 DIAGNOSIS — R0602 Shortness of breath: Secondary | ICD-10-CM | POA: Insufficient documentation

## 2014-08-15 DIAGNOSIS — E669 Obesity, unspecified: Secondary | ICD-10-CM | POA: Insufficient documentation

## 2014-08-15 DIAGNOSIS — K219 Gastro-esophageal reflux disease without esophagitis: Principal | ICD-10-CM | POA: Insufficient documentation

## 2014-08-15 DIAGNOSIS — Z6841 Body Mass Index (BMI) 40.0 and over, adult: Secondary | ICD-10-CM | POA: Insufficient documentation

## 2014-08-15 DIAGNOSIS — G932 Benign intracranial hypertension: Secondary | ICD-10-CM | POA: Diagnosis not present

## 2014-08-15 DIAGNOSIS — R0789 Other chest pain: Secondary | ICD-10-CM | POA: Insufficient documentation

## 2014-08-15 DIAGNOSIS — R079 Chest pain, unspecified: Secondary | ICD-10-CM | POA: Diagnosis present

## 2014-08-15 DIAGNOSIS — H540 Blindness, both eyes: Secondary | ICD-10-CM | POA: Diagnosis not present

## 2014-08-15 DIAGNOSIS — H547 Unspecified visual loss: Secondary | ICD-10-CM

## 2014-08-16 ENCOUNTER — Encounter (HOSPITAL_COMMUNITY): Payer: Self-pay | Admitting: Emergency Medicine

## 2014-08-16 ENCOUNTER — Emergency Department (HOSPITAL_COMMUNITY): Payer: Medicaid Other

## 2014-08-16 DIAGNOSIS — R0602 Shortness of breath: Secondary | ICD-10-CM | POA: Diagnosis present

## 2014-08-16 DIAGNOSIS — H540 Blindness, both eyes: Secondary | ICD-10-CM

## 2014-08-16 DIAGNOSIS — K219 Gastro-esophageal reflux disease without esophagitis: Secondary | ICD-10-CM

## 2014-08-16 DIAGNOSIS — G932 Benign intracranial hypertension: Secondary | ICD-10-CM | POA: Diagnosis present

## 2014-08-16 DIAGNOSIS — R0789 Other chest pain: Secondary | ICD-10-CM | POA: Diagnosis present

## 2014-08-16 DIAGNOSIS — R079 Chest pain, unspecified: Secondary | ICD-10-CM | POA: Diagnosis present

## 2014-08-16 DIAGNOSIS — H547 Unspecified visual loss: Secondary | ICD-10-CM | POA: Diagnosis present

## 2014-08-16 DIAGNOSIS — E669 Obesity, unspecified: Secondary | ICD-10-CM

## 2014-08-16 LAB — COMPREHENSIVE METABOLIC PANEL
ALT: 18 U/L (ref 0–35)
AST: 19 U/L (ref 0–37)
Albumin: 3.2 g/dL — ABNORMAL LOW (ref 3.5–5.2)
Alkaline Phosphatase: 92 U/L (ref 39–117)
Anion gap: 9 (ref 5–15)
BUN: 9 mg/dL (ref 6–23)
CO2: 27 mEq/L (ref 19–32)
Calcium: 9.1 mg/dL (ref 8.4–10.5)
Chloride: 103 mEq/L (ref 96–112)
Creatinine, Ser: 0.7 mg/dL (ref 0.50–1.10)
GFR calc Af Amer: >90 mL/min (ref >90)
GFR calc non Af Amer: >90 mL/min (ref >90)
Glucose, Bld: 151 mg/dL — ABNORMAL HIGH (ref 70–99)
Potassium: 4 mEq/L (ref 3.7–5.3)
Sodium: 139 mEq/L (ref 137–147)
Total Bilirubin: 0.2 mg/dL — ABNORMAL LOW (ref 0.3–1.2)
Total Protein: 8 g/dL (ref 6.0–8.3)

## 2014-08-16 LAB — CBC WITH DIFFERENTIAL/PLATELET
Basophils Absolute: 0 10*3/uL (ref 0.0–0.1)
Basophils Relative: 0 % (ref 0–1)
Eosinophils Absolute: 0.2 10*3/uL (ref 0.0–0.7)
Eosinophils Relative: 3 % (ref 0–5)
HCT: 40.9 % (ref 36.0–46.0)
Hemoglobin: 13.8 g/dL (ref 12.0–15.0)
Lymphocytes Relative: 31 % (ref 12–46)
Lymphs Abs: 1.7 10*3/uL (ref 0.7–4.0)
MCH: 28.4 pg (ref 26.0–34.0)
MCHC: 33.7 g/dL (ref 30.0–36.0)
MCV: 84.2 fL (ref 78.0–100.0)
Monocytes Absolute: 0.5 10*3/uL (ref 0.1–1.0)
Monocytes Relative: 8 % (ref 3–12)
Neutro Abs: 3.1 10*3/uL (ref 1.7–7.7)
Neutrophils Relative %: 58 % (ref 43–77)
Platelets: 261 10*3/uL (ref 150–400)
RBC: 4.86 MIL/uL (ref 3.87–5.11)
RDW: 13.3 % (ref 11.5–15.5)
WBC: 5.5 10*3/uL (ref 4.0–10.5)

## 2014-08-16 LAB — URINALYSIS, ROUTINE W REFLEX MICROSCOPIC
Bilirubin Urine: NEGATIVE
Glucose, UA: NEGATIVE mg/dL
Hgb urine dipstick: NEGATIVE
Ketones, ur: NEGATIVE mg/dL
Leukocytes, UA: NEGATIVE
Nitrite: NEGATIVE
Protein, ur: NEGATIVE mg/dL
Specific Gravity, Urine: 1.013 (ref 1.005–1.030)
Urobilinogen, UA: 0.2 mg/dL (ref 0.0–1.0)
pH: 6.5 (ref 5.0–8.0)

## 2014-08-16 LAB — LIPASE, BLOOD: Lipase: 41 U/L (ref 11–59)

## 2014-08-16 LAB — I-STAT TROPONIN, ED: Troponin i, poc: 0.01 ng/mL (ref 0.00–0.08)

## 2014-08-16 LAB — TROPONIN I
Troponin I: <0.3 ng/mL (ref <0.30)
Troponin I: <0.3 ng/mL (ref <0.30)

## 2014-08-16 LAB — D-DIMER, QUANTITATIVE: D-Dimer, Quant: 0.34 ug/mL-FEU (ref 0.00–0.48)

## 2014-08-16 LAB — PREGNANCY, URINE: Preg Test, Ur: NEGATIVE

## 2014-08-16 IMAGING — CR DG CHEST 2V
2 series · 2 of 2 positions shown · non-contrast
Comparison: None.

CLINICAL DATA: Chest pain tonight.  Shortness of breath.  Emesis.

EXAM:
CHEST  2 VIEW

[w chest lat]
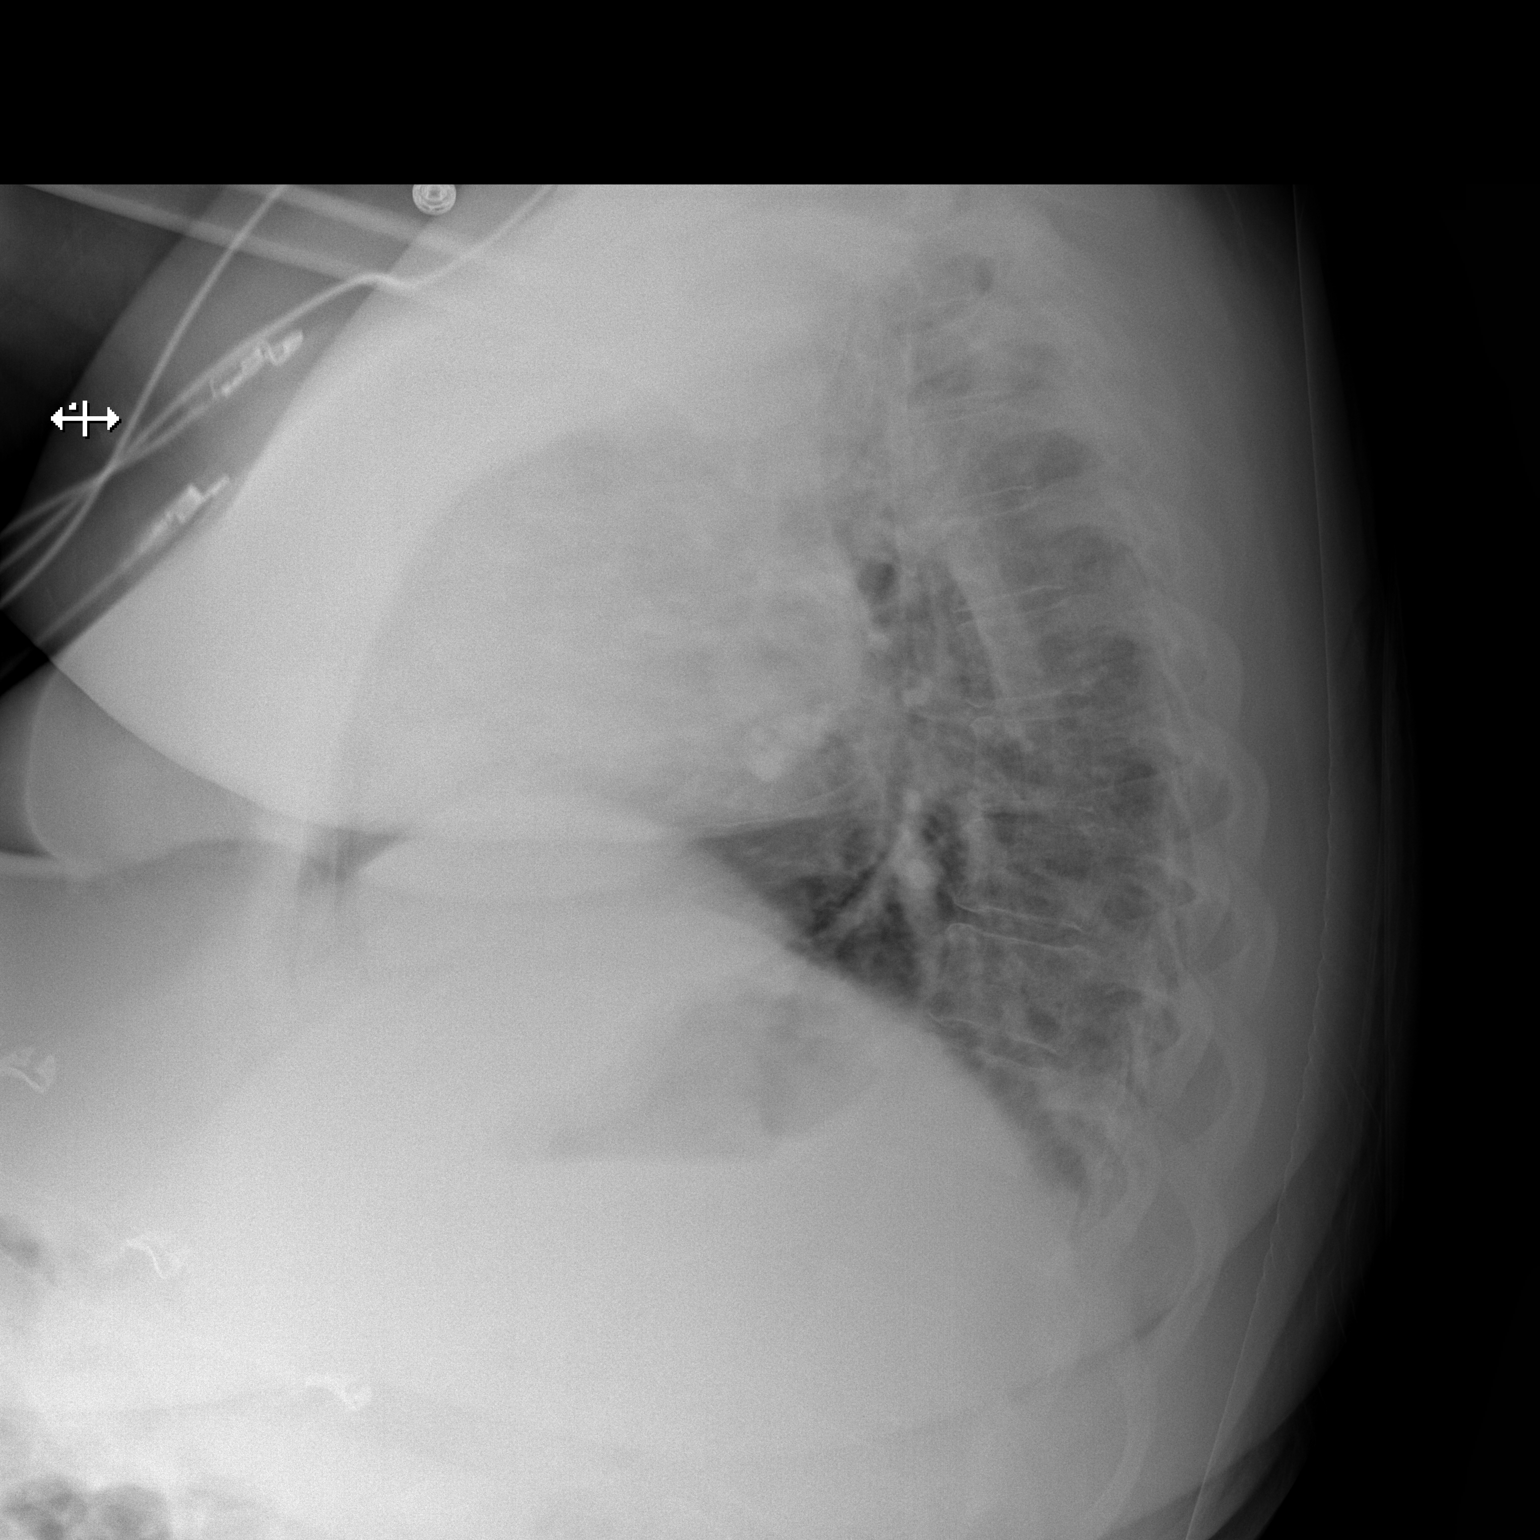

[x chest ap]
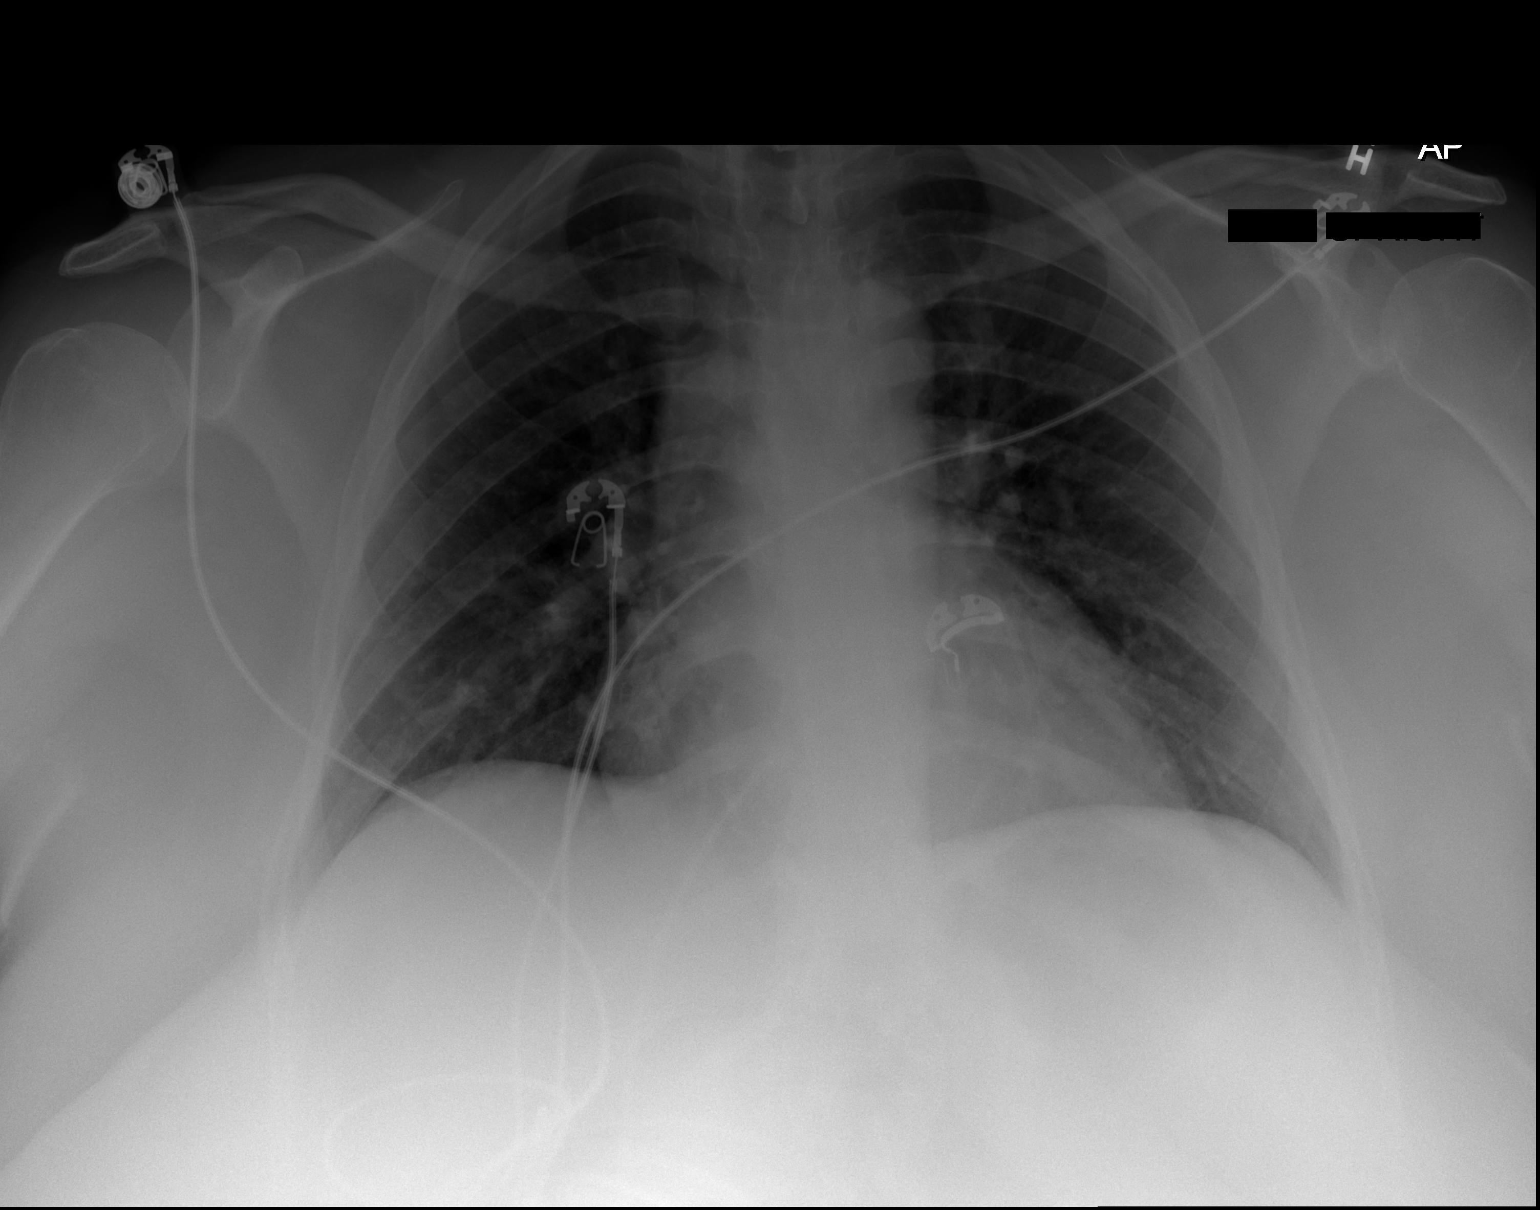

[2 of 2 positions shown; findings below may reference images not displayed]

FINDINGS: Shallow inspiration. The heart size and mediastinal contours are
within normal limits. Both lungs are clear. The visualized skeletal
structures are unremarkable.
IMPRESSION: No active cardiopulmonary disease.

## 2014-08-16 MED ORDER — NITROGLYCERIN 0.4 MG SL SUBL
0.4000 mg | SUBLINGUAL_TABLET | SUBLINGUAL | Status: DC | PRN
  Administered 2014-08-16: 0.4 mg via SUBLINGUAL
  Filled 2014-08-16: qty 1

## 2014-08-16 MED ORDER — OMEPRAZOLE 20 MG PO CPDR: 20.0000 mg | DELAYED_RELEASE_CAPSULE | Freq: Every day | ORAL | Status: DC

## 2014-08-16 MED ORDER — ASPIRIN 325 MG PO TABS
325.0000 mg | ORAL_TABLET | Freq: Once | ORAL | Status: AC
  Administered 2014-08-16: 325 mg via ORAL
  Filled 2014-08-16: qty 1

## 2014-08-16 MED ORDER — TETANUS-DIPHTH-ACELL PERTUSSIS 5-2.5-18.5 LF-MCG/0.5 IM SUSP
0.5000 mL | Freq: Once | INTRAMUSCULAR | Status: DC
  Filled 2014-08-16: qty 0.5

## 2014-08-16 MED ORDER — ACETAMINOPHEN 325 MG PO TABS: 650.0000 mg | ORAL_TABLET | ORAL | Status: DC | PRN

## 2014-08-16 MED ORDER — FAMOTIDINE 20 MG PO TABS
20.0000 mg | ORAL_TABLET | Freq: Two times a day (BID) | ORAL | Status: DC
  Administered 2014-08-16: 20 mg via ORAL
  Filled 2014-08-16 (×2): qty 1

## 2014-08-16 MED ORDER — ONDANSETRON HCL 4 MG/2ML IJ SOLN: 4.0000 mg | Freq: Four times a day (QID) | INTRAMUSCULAR | Status: DC | PRN

## 2014-08-16 MED ORDER — ASPIRIN EC 325 MG PO TBEC
325.0000 mg | DELAYED_RELEASE_TABLET | Freq: Every day | ORAL | Status: DC
  Filled 2014-08-16: qty 1

## 2014-08-16 MED ORDER — GI COCKTAIL ~~LOC~~
30.0000 mL | Freq: Once | ORAL | Status: AC
  Administered 2014-08-16: 30 mL via ORAL
  Filled 2014-08-16: qty 30

## 2014-08-16 MED ORDER — NITROGLYCERIN 0.4 MG SL SUBL: 0.4000 mg | SUBLINGUAL_TABLET | SUBLINGUAL | Status: DC | PRN

## 2014-08-16 MED ORDER — GI COCKTAIL ~~LOC~~
30.0000 mL | Freq: Four times a day (QID) | ORAL | Status: DC | PRN
  Filled 2014-08-16: qty 30

## 2014-08-16 NOTE — H&P (Signed)
PCP:   Lonia BloodGARBA,LAWAL, MD   Chief Complaint:  cp  HPI: 48 yo female h/o pseudotumor cerebri with blindness comes in with sscp without radiation started earlier today and lasted for over 20 minutes and pleuritic in nature.  Pt denies any fevers or cough.  No le edema or swelling and no recent h/o traveling or surgery.  Pain was relieved by aspirin and ntg and morphine in the ED.  No prior h/o CAD.  Pt speaks mainly arabic, daughter present and interpreting.  Review of Systems:  Positive and negative as per HPI otherwise all other systems are negative  Past Medical History: Past Medical History  Diagnosis Date  . Abnormal finding in CSF   . Low blood pressure   . Blindness - both eyes    Past Surgical History  Procedure Laterality Date  . Tonsillectomy      Medications: Prior to Admission medications   Medication Sig Start Date End Date Taking? Authorizing Provider  albuterol (PROVENTIL HFA;VENTOLIN HFA) 108 (90 BASE) MCG/ACT inhaler Inhale 2 puffs into the lungs every 6 (six) hours as needed for wheezing or shortness of breath.   Yes Historical Provider, MD  ibuprofen (ADVIL,MOTRIN) 200 MG tablet Take 400 mg by mouth every 6 (six) hours as needed for moderate pain.   Yes Historical Provider, MD    Allergies:  No Known Allergies  Social History:  reports that she has never smoked. She does not have any smokeless tobacco history on file. She reports that she does not drink alcohol or use illicit drugs.  Family History: History reviewed. No pertinent family history.  Physical Exam: Filed Vitals:   08/15/14 2353  BP: 170/83  Pulse: 107  Temp: 97.8 F (36.6 C)  TempSrc: Oral  Resp: 18  SpO2: 99%   General appearance: alert, cooperative and no distress Head: Normocephalic, without obvious abnormality, atraumatic Eyes: negative Nose: Nares normal. Septum midline. Mucosa normal. No drainage or sinus tenderness. Neck: no JVD and supple, symmetrical, trachea midline Lungs:  clear to auscultation bilaterally Heart: regular rate and rhythm, S1, S2 normal, no murmur, click, rub or gallop Abdomen: soft, non-tender; bowel sounds normal; no masses,  no organomegaly Extremities: extremities normal, atraumatic, no cyanosis or edema Pulses: 2+ and symmetric Skin: Skin color, texture, turgor normal. No rashes or lesions Neurologic: Grossly normal  Labs on Admission:   Recent Labs  08/16/14 0159  NA 139  K 4.0  CL 103  CO2 27  GLUCOSE 151*  BUN 9  CREATININE 0.70  CALCIUM 9.1    Recent Labs  08/16/14 0159  AST 19  ALT 18  ALKPHOS 92  BILITOT 0.2*  PROT 8.0  ALBUMIN 3.2*    Recent Labs  08/16/14 0159  LIPASE 41    Recent Labs  08/16/14 0159  WBC 5.5  NEUTROABS 3.1  HGB 13.8  HCT 40.9  MCV 84.2  PLT 261    Radiological Exams on Admission: Dg Chest 2 View  08/16/2014   CLINICAL DATA:  Chest pain tonight.  Shortness of breath.  Emesis.  EXAM: CHEST  2 VIEW  COMPARISON:  None.  FINDINGS: Shallow inspiration. The heart size and mediastinal contours are within normal limits. Both lungs are clear. The visualized skeletal structures are unremarkable.  IMPRESSION: No active cardiopulmonary disease.   Electronically Signed   By: Burman NievesWilliam  Stevens M.D.   On: 08/16/2014 01:29    Assessment/Plan  48 yo female with chest pain, low risk  Principal Problem:   Chest pain,  atypical-  obs on tele.  Romi, echo in am.  Low prob for PE, ddimer negative, no further w/u.  Aspirin.  Further outpt stress testing with pcp if all negative here.  Active Problems:  Stable unless o/w noted   SOB (shortness of breath)   Obese   Pseudotumor cerebri-  G[place] yearly spinal taps, [place]   Blindness  Full code.  obs on telemetry floor.  Hokulani Rogel A 08/16/2014, 3:46 AM

## 2014-08-16 NOTE — ED Notes (Signed)
Pt arrived to the ED with a complaint of chest pain.  Pt states she is also having shortness of breath.  Pt states the pain is a tightness.  Pt given 2 puffs of her albuterol inhaler.  Pt also has had emesis in triage.  Pt's family states's he has a history of hypotension but presents with hypertension

## 2014-08-16 NOTE — Progress Notes (Signed)
  Echocardiogram 2D Echocardiogram has been performed.  Janalyn HarderWest, Connery Shiffler R 08/16/2014, 3:16 PM

## 2014-08-16 NOTE — Progress Notes (Signed)
UR completed 

## 2014-08-16 NOTE — Discharge Summary (Addendum)
Physician Discharge Summary  Janice Hanson [id number] DOB: 10-10-1966 DOA: 08/15/2014  PCP: Lonia BloodGARBA,LAWAL, MD  Admit date: 08/15/2014 Discharge date: 08/16/2014  Recommendations for Outpatient Follow-up:  1. Followup with primary care Doctor within one week of discharge. Please repeat blood pressure.  Could consider referral for stress test, however I feel this pain is more likely related to acid reflux and stress given how it immediately responded to GI cocktail.   2. Please repeat CBG and consider A1c if still elevated.    Discharge Diagnoses:  Principal Problem:   GERD (gastroesophageal reflux disease) Active Problems:   Chest pain, atypical   SOB (shortness of breath)   Obese   Pseudotumor cerebri   Blindness   Chest pain   Discharge Condition: Stable, improved   Diet recommendation:  Regular  Wt Readings from Last 3 Encounters:  08/16/14 119.3 kg (263 lb 0.1 oz)  05/19/14 131.543 kg (290 lb)    History of present illness:  Patient is a 48 year old female with pseudotumor cerebra and blindness who came in with atypical chest pain. She had substernal chest pressure without radiation that was pleuritic not associated with shortness of breath, nausea, lightheadedness. Her daughter took her blood pressure at home and her blood pressure was 150/128 seconds the emergency department.   Hospital Course:   Atypical chest pain, likely GERD.  Her description of her pain was that she had significant chest burning without associated symptoms that would be more suggestive of ACS. She is obese but otherwise she has no risk factors for heart disease. Her telemetry demonstrated normal sinus rhythm. Her troponins remain negative. Echocardiogram demonstrated mild concentric hypertrophy with normal systolic function and ejection fraction of 55-60%. There were no regional wall motion abnormalities. She had mild dilation of the left atrium. She had significant improvement in her symptoms after  a GI cocktail. Recommended she start PPI for probable acid reflux, and use TUMS as needed for breakthrough symptoms. She should followup with her primary care doctor within one week.  She was given a prescription for nitroglycerin to use as needed if TUMS does not work.  Elevated blood pressure, family was very concerned about this. I believe that her elevated blood pressure was secondary to her pain and distress when she came to the emergency department. Since that time, blood pressures have all been within normal range.   Hyperglycemia, may due to stress.  Will have PCP repeat as outpatient.    Procedures:  CXR  ECHO  Consultations:  none  Discharge Exam: Filed Vitals:   08/16/14 1332  BP: 134/71  Pulse:   Temp: 97.5 F (36.4 C)  Resp: 20   Filed Vitals:   08/16/14 0620 08/16/14 0651 08/16/14 1328 08/16/14 1332  BP: 116/73 112/61  134/71  Pulse: 82 80 80   Temp:  98.1 F (36.7 C)  97.5 F (36.4 C)  TempSrc:  Oral  Oral  Resp: 21 20  20   Height:  5\' 7"  (1.702 m)    Weight:  119.3 kg (263 lb 0.1 oz)    SpO2: 97% 100% 100%     Gen: BF, NAD  CV: RRR, no mrg  PULM: CTAB  ABD: NABS, soft, ND/ND  MSK: No chest wall tenderness, no LEE, normal tone and bulk and  Discharge Instructions      Discharge Instructions   Call MD for:  difficulty breathing, headache or visual disturbances    Complete by:  As directed      Call MD for:  extreme fatigue    Complete by:  As directed      Call MD for:  hives    Complete by:  As directed      Call MD for:  persistant dizziness or light-headedness    Complete by:  As directed      Call MD for:  persistant nausea and vomiting    Complete by:  As directed      Call MD for:  severe uncontrolled pain    Complete by:  As directed      Call MD for:  temperature >100.4    Complete by:  As directed      Diet general    Complete by:  As directed      Discharge instructions    Complete by:  As directed   You were hospitalized with  chest pain which was probably due to acid reflux.  Please do not use ibuprofen, aleve, or aspirin for pain as these medications can worsen acid reflux.  PLease use tylenol instead.  Take omeprazole once a day to help with your acid reflux and you may use TUMS (over the counter) for breakthrough heart burn.  Omeprazole should be taken every day to PREVENT heartburn from starting and it takes several days for this medication to start working.  TUMS can be taken as needed for heart burn. Talk to your primary care doctor about your symptoms.  They may recommend that you have further testing for your heart and for your reflux.  If you have severe chest pain with shortness of breath that does not get better with TUMS, try a dose of nitroglycerin and call 911.     Increase activity slowly    Complete by:  As directed             Medication List    STOP taking these medications       ibuprofen 200 MG tablet  Commonly known as:  ADVIL,MOTRIN      TAKE these medications       albuterol 108 (90 BASE) MCG/ACT inhaler  Commonly known as:  PROVENTIL HFA;VENTOLIN HFA  Inhale 2 puffs into the lungs every 6 (six) hours as needed for wheezing or shortness of breath.     nitroGLYCERIN 0.4 MG SL tablet  Commonly known as:  NITROSTAT  Place 1 tablet (0.4 mg total) under the tongue every 5 (five) minutes as needed for chest pain.     omeprazole 20 MG capsule  Commonly known as:  PRILOSEC  Take 1 capsule (20 mg total) by mouth daily.       Follow-up Information   [facility] Schedule an appointment as soon as possible for a visit in 1 week.   Specialty:  Internal Medicine   Contact information:   [address] [place] [address] [phone]        The results of significant diagnostics from this hospitalization (including imaging, microbiology, ancillary and laboratory) are listed below for reference.    Significant Diagnostic Studies: Dg Chest 2  View  08/16/2014   CLINICAL DATA:  Chest pain tonight.  Shortness of breath.  Emesis.  EXAM: CHEST  2 VIEW  COMPARISON:  None.  FINDINGS: Shallow inspiration. The heart size and mediastinal contours are within normal limits. Both lungs are clear. The visualized skeletal structures are unremarkable.  IMPRESSION: No active cardiopulmonary disease.   Electronically Signed   By: Burman NievesWilliam  Stevens M.D.   On:  08/16/2014 01:29    Microbiology: No results found for this or any previous visit (from the past 240 hour(s)).   Labs: Basic Metabolic Panel:  Recent Labs Lab 08/16/14 0159  NA 139  K 4.0  CL 103  CO2 27  GLUCOSE 151*  BUN 9  CREATININE 0.70  CALCIUM 9.1   Liver Function Tests:  Recent Labs Lab 08/16/14 0159  AST 19  ALT 18  ALKPHOS 92  BILITOT 0.2*  PROT 8.0  ALBUMIN 3.2*    Recent Labs Lab 08/16/14 0159  LIPASE 41   No results found for this basename: AMMONIA,  in the last 168 hours CBC:  Recent Labs Lab 08/16/14 0159  WBC 5.5  NEUTROABS 3.1  HGB 13.8  HCT 40.9  MCV 84.2  PLT 261   Cardiac Enzymes:  Recent Labs Lab 08/16/14 0747 08/16/14 1507  TROPONINI <0.30 <0.30   BNP: BNP (last 3 results) No results found for this basename: PROBNP,  in the last 8760 hours CBG: No results found for this basename: GLUCAP,  in the last 168 hours  Time coordinating discharge: 25 minutes  Signed:  Docia Klar  [facility] 08/16/2014, 5:33 PM

## 2014-08-16 NOTE — ED Provider Notes (Signed)
CSN: [id number]     Arrival date & time 08/15/14  2342 History   First MD Initiated Contact with Patient 08/16/14 0032     Chief Complaint  Patient presents with  . Chest Pain  . Shortness of Breath     (Consider location/radiation/quality/duration/timing/severity/associated sxs/prior Treatment) HPI Janice Hanson is a 48 y.o. female with past medical history of pseudotumor leading to blindness, hypertension coming in with chest pain shortness of breath. History is obtained from daughter who is translating in the room. She states she had sudden onset of chest pain shortness of breath while doing the dishes around 11 PM. It is midsternal without any radiation, described as a tightness. Associated with emesis greater than 5 times, and diaphoresis. Pain is pleuritic. It is also made worse with palpation. Nothing has made her pain better. She was given albuterol puffs at home without any relief. Patient denies having these symptoms prior. She denies history of hypertension, diabetes, or high cholesterol.  She's had no pain or swelling in her legs, denies risk factors for or any history of blood clots.  Patient has no further complaints.  10 Systems reviewed and are negative for acute change except as noted in the HPI.     Past Medical History  Diagnosis Date  . Abnormal finding in CSF   . Low blood pressure   . Blindness - both eyes    Past Surgical History  Procedure Laterality Date  . Tonsillectomy     History reviewed. No pertinent family history. History  Substance Use Topics  . Smoking status: Never Smoker   . Smokeless tobacco: Not on file  . Alcohol Use: No   OB History   Grav Para Term Preterm Abortions TAB SAB Ect Mult Living                 Review of Systems    Allergies  Review of patient's allergies indicates no known allergies.  Home Medications   Prior to Admission medications   Medication Sig Start Date End Date Taking? Authorizing Provider  albuterol  (PROVENTIL HFA;VENTOLIN HFA) 108 (90 BASE) MCG/ACT inhaler Inhale 2 puffs into the lungs every 6 (six) hours as needed for wheezing or shortness of breath.   Yes Historical Provider, MD  ibuprofen (ADVIL,MOTRIN) 200 MG tablet Take 400 mg by mouth every 6 (six) hours as needed for moderate pain.   Yes Historical Provider, MD   BP 170/83  Pulse 107  Temp(Src) 97.8 F (36.6 C) (Oral)  Resp 18  SpO2 99%  LMP 05/28/2014 Physical Exam  Nursing note and vitals reviewed. Constitutional: She is oriented to person, place, and time. She appears well-developed and well-nourished. No distress.  Obese female  HENT:  Head: Normocephalic and atraumatic.  Nose: Nose normal.  Mouth/Throat: Oropharynx is clear and moist. No oropharyngeal exudate.  Eyes: Conjunctivae and EOM are normal. Pupils are equal, round, and reactive to light. No scleral icterus.  Neck: Normal range of motion. Neck supple. No JVD present. No tracheal deviation present. No thyromegaly present.  Cardiovascular: Normal rate, regular rhythm and normal heart sounds.  Exam reveals no gallop and no friction rub.   No murmur heard. Pulmonary/Chest: Effort normal and breath sounds normal. No respiratory distress. She has no wheezes. She exhibits no tenderness.  Abdominal: Soft. Bowel sounds are normal. She exhibits no distension and no mass. There is no tenderness. There is no rebound and no guarding.  Musculoskeletal: Normal range of motion. She exhibits no edema and no  tenderness.  Lymphadenopathy:    She has no cervical adenopathy.  Neurological: She is alert and oriented to person, place, and time. She exhibits normal muscle tone.  Bilateral eye blindness.  Skin: Skin is warm and dry. No rash noted. She is not diaphoretic. No erythema. No pallor.    ED Course  Procedures (including critical care time) Labs Review Labs Reviewed  COMPREHENSIVE METABOLIC PANEL - Abnormal; Notable for the following:    Glucose, Bld 151 (*)    Albumin  3.2 (*)    Total Bilirubin 0.2 (*)    All other components within normal limits  CBC WITH DIFFERENTIAL  LIPASE, BLOOD  URINALYSIS, ROUTINE W REFLEX MICROSCOPIC  PREGNANCY, URINE  D-DIMER, QUANTITATIVE  I-STAT TROPOININ, ED    Imaging Review Dg Chest 2 View  08/16/2014   CLINICAL DATA:  Chest pain tonight.  Shortness of breath.  Emesis.  EXAM: CHEST  2 VIEW  COMPARISON:  None.  FINDINGS: Shallow inspiration. The heart size and mediastinal contours are within normal limits. Both lungs are clear. The visualized skeletal structures are unremarkable.  IMPRESSION: No active cardiopulmonary disease.   Electronically Signed   By: Burman NievesWilliam  Stevens M.D.   On: 08/16/2014 01:29     EKG Interpretation   Date/Time:  Friday August 15 2014 23:49:55 EDT Ventricular Rate:  103 PR Interval:  161 QRS Duration: 96 QT Interval:  325 QTC Calculation: 425 R Axis:   17 Text Interpretation:  Sinus tachycardia Probable left atrial enlargement  Borderline T wave abnormalities Confirmed by Erroll Lunani, Jareb Radoncic Ayokunle [phone]([id number])  on 08/16/2014 12:33:43 AM      MDM   Final diagnoses:  Chest pain    Patient does emergency department a concern for chest pain.  Her story is concerning for possible ACS, will evaluate with troponin, d-dimer and likely admission for stress test.  D-dimer is negative, as was troponin and chest x-ray. Patient's history is concerning for ACS and she will need to be retained in the hospital. The hospitalist service has agreed to admit the patient.  Tomasita CrumbleAdeleke Roshun Klingensmith, MD 08/16/14 [phone]

## 2014-08-16 NOTE — Progress Notes (Signed)
Patient arrived from ED via stretcher. Complains of chest tightness 3/10, denies any need for treatment. No respiratory difficulty noted. Patient daughter at bedside.

## 2014-08-16 NOTE — Progress Notes (Signed)
S:  Patient continuing to have chest burning sensation.  Chronic right shoulder pain.    O:  VSS Gen:  BF, NAD CV:  RRR, no mrg PULM:  CTAB ABD:  NABS, soft, ND/ND MSK:  No chest wall tenderness, no LEE, normal tone and bulk  Labs:  Troponin neg x 2, ECHO pending.  Tele:  NSR  A/P:  Atypical chest pain, sounds like acid reflux, but ruling out ACS. Obese, but otherwise no risk factors.   -  F/u final troponin -  ECHO -  If ECHO normal, okay to d/c to home with PCP f/u

## 2014-10-28 ENCOUNTER — Encounter: Payer: Self-pay | Admitting: Internal Medicine

## 2014-11-03 ENCOUNTER — Ambulatory Visit (INDEPENDENT_AMBULATORY_CARE_PROVIDER_SITE_OTHER): Payer: Medicaid Other | Admitting: Internal Medicine

## 2014-11-03 ENCOUNTER — Encounter: Payer: Self-pay | Admitting: Internal Medicine

## 2014-11-03 VITALS — BP 110/70 | HR 76 | Ht 66.0 in | Wt 279.0 lb

## 2014-11-03 DIAGNOSIS — R1110 Vomiting, unspecified: Secondary | ICD-10-CM

## 2014-11-03 DIAGNOSIS — K219 Gastro-esophageal reflux disease without esophagitis: Secondary | ICD-10-CM

## 2014-11-03 DIAGNOSIS — R1115 Cyclical vomiting syndrome unrelated to migraine: Secondary | ICD-10-CM

## 2014-11-03 NOTE — Progress Notes (Signed)
Subjective:    Patient ID: Janice Hanson, female    DOB: 1966-07-28, 49 y.o.   MRN: [id number]  HPI The patient is a pleasant married [place] woman here because of reflux problems. She was admitted to the hospital in late October for a night with chest pain, she ruled out for MI. She improved with GI cocktail and was thought she was probably having reflux problems. Omeprazole 40 mg daily was prescribed. Her primary care physician subsequent Nedra Hai told her to stop taking ibuprofen and Celebrex. Chest pain and heartburn and indigestion are all significantly better if not gone but she still has occasional intermittent postprandial vomiting about an hour after meals. It sounds like fruits and fibrous materials may cause more problems for her. Now movements did not seem to be affected. She's not had any evidence of GI bleeding. There was some concern that this might be cardiac in origin though she really has improved overall. She is waiting to see Dr. Nadara Eaton of cardiology. Not yet scheduled.  Husband is present and serves as Nurse, learning disability.  No Known Allergies Outpatient Prescriptions Prior to Visit  Medication Sig Dispense Refill  . albuterol (PROAIR HFA) 108 (90 BASE) MCG/ACT inhaler Inhale 2 puffs into the lungs 2 (two) times daily.    Marland Kitchen levothyroxine (SYNTHROID, LEVOTHROID) 75 MCG tablet Take 75 mcg by mouth daily before breakfast.    . lisinopril (PRINIVIL,ZESTRIL) 20 MG tablet Take 20 mg by mouth daily.    . nitroGLYCERIN (NITROSTAT) 0.4 MG SL tablet Place 1 tablet (0.4 mg total) under the tongue every 5 (five) minutes as needed for chest pain. 20 tablet 0  . omeprazole (PRILOSEC) 40 MG capsule Take 40 mg by mouth daily.    . celecoxib (CELEBREX) 200 MG capsule Take 200 mg by mouth 2 (two) times daily.    Marland Kitchen ibuprofen (ADVIL,MOTRIN) 800 MG tablet Take 800 mg by mouth 2 (two) times daily.     No facility-administered medications prior to visit.   Past Medical History  Diagnosis Date  . Abnormal  finding in CSF   . Low blood pressure   . Blindness - both eyes   . GERD (gastroesophageal reflux disease)   . Obesity   . Pseudomembranous colitis   . Pseudotumor cerebri   . Kidney stones   . Arthritis   . Hypothyroidism    Past Surgical History  Procedure Laterality Date  . Tonsillectomy     History   Social History  . Marital Status: Single    Spouse Name: N/A    Number of Children: 4  . Years of Education: N/A   Occupational History  . na    Social History Main Topics  . Smoking status: Never Smoker   . Smokeless tobacco: Never Used  . Alcohol Use: No  . Drug Use: No       Other Topics Concern  . None   Social History Narrative   Married originally from [place]   4 children 2 girls/2 boys   Family History  Problem Relation Age of Onset  . Diabetes Brother   . Heart disease Paternal Grandfather   . Kidney Stones Maternal Uncle   . Diabetes Maternal Grandmother   . 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 She complains of headaches that are intermittent, night sweats, myalgia, back pain. All other review of systems are negative or as per history of present illness.  Objective:   Physical Exam General:  Obese African woman in no acute distress Eyes:  anicteric. Lungs: Clear to auscultation bilaterally. Heart:  S1S2, no rubs, murmurs, gallops. Abdomen:  soft, obese, mildly tender epigastrium, no hepatosplenomegaly, hernia, or mass and BS+.  Extremities:   no edema    Data Reviewed: Dr. Maxwell CaulGarba's notes [facility]labs  08/2014 labs Hgb A1c 6.1% CBC NL CMET NL TSH NL     Assessment & Plan:   1. Gastroesophageal reflux disease, esophagitis presence not specified   2. Persistent vomiting    She has had a significant improvement with PPI therapy that suggest this is related to GERD and not heart disease. She still has vomiting about once  a week, postprandial. It's possible she had some sort of ulcer or an outlet obstruction and still has a stricture. I quizzed her and her husband particularly about dysphagia and I don't think she has that. Because of the persistent vomiting I think an upper GI endoscopy as appropriate. I've explained the risks benefits and indications to the patient by speaking to her husband who served as Nurse, learning disabilitytranslator.  I appreciate the opportunity to care for this patient.   CC: Lonia BloodGARBA,LAWAL, MD

## 2014-11-03 NOTE — Patient Instructions (Addendum)
  You have been scheduled for an endoscopy. Please follow written instructions given to you at your visit today. If you use inhalers (even only as needed), please bring them with you on the day of your procedure.   I appreciate the opportunity to care for you. Carl Gessner, MD, FACG 

## 2014-11-12 ENCOUNTER — Ambulatory Visit (AMBULATORY_SURGERY_CENTER): Payer: Medicaid Other | Admitting: Internal Medicine

## 2014-11-12 ENCOUNTER — Encounter: Payer: Self-pay | Admitting: Internal Medicine

## 2014-11-12 VITALS — BP 123/76 | HR 68 | Temp 95.8°F | Resp 15 | Ht 66.0 in | Wt 279.0 lb

## 2014-11-12 DIAGNOSIS — R1110 Vomiting, unspecified: Secondary | ICD-10-CM

## 2014-11-12 DIAGNOSIS — K219 Gastro-esophageal reflux disease without esophagitis: Secondary | ICD-10-CM

## 2014-11-12 DIAGNOSIS — R1115 Cyclical vomiting syndrome unrelated to migraine: Secondary | ICD-10-CM

## 2014-11-12 DIAGNOSIS — K2950 Unspecified chronic gastritis without bleeding: Secondary | ICD-10-CM | POA: Diagnosis not present

## 2014-11-12 MED ORDER — SODIUM CHLORIDE 0.9 % IV SOLN: 500.0000 mL | INTRAVENOUS | Status: DC

## 2014-11-12 NOTE — Patient Instructions (Addendum)
The stomach was inflamed or irritated - we call that gastritis. Not sure if that is the problem. I took biopsies of the stomach to understand better and will let you know results and plans.  I appreciate the opportunity to care for you. Iva Boop, MD, FACG  YOU HAD AN ENDOSCOPIC PROCEDURE TODAY AT THE [facility]: Refer to the procedure report that was given to you for any specific questions about what was found during the examination.  If the procedure report does not answer your questions, please call your gastroenterologist to clarify.  If you requested that your care partner not be given the details of your procedure findings, then the procedure report has been included in a sealed envelope for you to review at your convenience later.  YOU SHOULD EXPECT: Some feelings of bloating in the abdomen. Passage of more gas than usual.  Walking can help get rid of the air that was put into your GI tract during the procedure and reduce the bloating. If you had a lower endoscopy (such as a colonoscopy or flexible sigmoidoscopy) you may notice spotting of blood in your stool or on the toilet paper. If you underwent a bowel prep for your procedure, then you may not have a normal bowel movement for a few days.  DIET: Your first meal following the procedure should be a light meal and then it is ok to progress to your normal diet.  A half-sandwich or bowl of soup is an example of a good first meal.  Heavy or fried foods are harder to digest and may make you feel nauseous or bloated.  Likewise meals heavy in dairy and vegetables can cause extra gas to form and this can also increase the bloating.  Drink plenty of fluids but you should avoid alcoholic beverages for 24 hours.  ACTIVITY: Your care partner should take you home directly after the procedure.  You should plan to take it easy, moving slowly for the rest of the day.  You can resume normal activity the day after the procedure however  you should NOT DRIVE or use heavy machinery for 24 hours (because of the sedation medicines used during the test).    SYMPTOMS TO REPORT IMMEDIATELY: A gastroenterologist can be reached at any hour.  During normal business hours, 8:30 AM to 5:00 PM Monday through Friday, call [phone].  After hours and on weekends, please call the GI answering service at [phone] who will take a message and have the physician on call contact you.   Following upper endoscopy (EGD)  Vomiting of blood or coffee ground material  New chest pain or pain under the shoulder blades  Painful or persistently difficult swallowing  New shortness of breath  Fever of 100F or higher  Black, tarry-looking stools  FOLLOW UP: If any biopsies were taken you will be contacted by phone or by letter within the next 1-3 weeks.  Call your gastroenterologist if you have not heard about the biopsies in 3 weeks.  Our staff will call the home number listed on your records the next business day following your procedure to check on you and address any questions or concerns that you may have at that time regarding the information given to you following your procedure. This is a courtesy call and so if there is no answer at the home number and we have not heard from you through the emergency physician on call, we will assume that you have returned to your  regular daily activities without incident.  SIGNATURES/CONFIDENTIALITY: You and/or your care partner have signed paperwork which will be entered into your electronic medical record.  These signatures attest to the fact that that the information above on your After Visit Summary has been reviewed and is understood.  Full responsibility of the confidentiality of this discharge information lies with you and/or your care-partner.  Recommendations Biopsy results pending. Gastritis handout provided to patient/care partner.

## 2014-11-12 NOTE — Progress Notes (Signed)
Called to room to assist during endoscopic procedure.  Patient ID and intended procedure confirmed with present staff. Received instructions for my participation in the procedure from the performing physician.  

## 2014-11-12 NOTE — Op Note (Signed)
 [facility] [address] [place], [id number]   ENDOSCOPY PROCEDURE REPORT  PATIENT: Janice Hanson, Janice Hanson  MR#: [id number] BIRTHDATE: [birth year] , 48  yrs. old GENDER: female ENDOSCOPIST: Iva Booparl E Stryker Veasey, MD, [facility] PROCEDURE DATE:  11/12/2014 PROCEDURE:  EGD w/ biopsy ASA CLASS:     Class III INDICATIONS:  vomiting. MEDICATIONS: Propofol 200 mg IV, lidocaine 150 mg IV, and Monitored anesthesia care TOPICAL ANESTHETIC: none  DESCRIPTION OF PROCEDURE: After the risks benefits and alternatives of the procedure were thoroughly explained, informed consent was obtained.  The LB [id number] endoscope was introduced through the mouth and advanced to the second portion of the duodenum , Without limitations.  The instrument was slowly withdrawn as the mucosa was fully examined.    1) Mild-moderate non-erosive gastritis in body and antrum - biopsies taken. 2) Otherwise normal EGD.  Retroflexed views revealed no abnormalities.     The scope was then withdrawn from the patient and the procedure completed.  COMPLICATIONS: There were no immediate complications.  ENDOSCOPIC IMPRESSION: 1) Mild-moderate non-erosive gastritis in body and antrum - biopsies taken. 2) Otherwise normal EGD  RECOMMENDATIONS: 1.  Await biopsy results 2.  May need neuro evaluation given hx pseudotumor cerebri  eSigned:  Iva Booparl E Retia Cordle, MD, [facility] 11/12/2014 8:26 AM    CC: Lonia BloodLawal Garba, MD and The Patient

## 2014-11-12 NOTE — Progress Notes (Signed)
Report to PACU, RN, vss, BBS= Clear.  

## 2014-11-13 ENCOUNTER — Telehealth: Payer: Self-pay | Admitting: *Deleted

## 2014-11-13 NOTE — Telephone Encounter (Signed)
  Follow up Call-  Call back number 11/12/2014  Post procedure Call Back phone  # [phone]  Permission to leave phone message Yes    Spoke with pt's father Patient questions:  Do you have a fever, pain , or abdominal swelling? No. Pain Score  0 *  Have you tolerated food without any problems? Yes.    Have you been able to return to your normal activities? Yes.    Do you have any questions about your discharge instructions: Diet   No. Medications  No. Follow up visit  No.  Do you have questions or concerns about your Care? No.  Actions: * If pain score is 4 or above: No action needed, pain <4.

## 2014-11-20 ENCOUNTER — Encounter: Payer: Self-pay | Admitting: Internal Medicine

## 2014-11-20 DIAGNOSIS — B9681 Helicobacter pylori [H. pylori] as the cause of diseases classified elsewhere: Secondary | ICD-10-CM

## 2014-11-20 DIAGNOSIS — K2970 Gastritis, unspecified, without bleeding: Secondary | ICD-10-CM

## 2014-11-20 HISTORY — DX: Helicobacter pylori (H. pylori) as the cause of diseases classified elsewhere: B96.81

## 2014-11-20 NOTE — Progress Notes (Signed)
Quick Note:  Please call and let husband know she has H. Pylori gastritis  Rx amoxicillin 1000 mg bid and clarithromycin 500 mg bid x 14 days please She is already on a PPI  Can f/u PRN ______

## 2014-11-21 ENCOUNTER — Other Ambulatory Visit: Payer: Self-pay

## 2014-11-21 MED ORDER — AMOXICILLIN 500 MG PO CAPS: 1000.0000 mg | ORAL_CAPSULE | Freq: Two times a day (BID) | ORAL | Status: DC

## 2014-11-21 MED ORDER — CLARITHROMYCIN 500 MG PO TABS: 500.0000 mg | ORAL_TABLET | Freq: Two times a day (BID) | ORAL | Status: DC

## 2015-04-17 ENCOUNTER — Emergency Department (HOSPITAL_COMMUNITY)
Admission: EM | Admit: 2015-04-17 | Discharge: 2015-04-17 | Disposition: A | Discharge status: Home / Self Care | Payer: Medicaid Other | Attending: Emergency Medicine | Admitting: Emergency Medicine

## 2015-04-17 ENCOUNTER — Emergency Department (HOSPITAL_COMMUNITY): Payer: Medicaid Other

## 2015-04-17 ENCOUNTER — Encounter (HOSPITAL_COMMUNITY): Payer: Self-pay | Admitting: Emergency Medicine

## 2015-04-17 DIAGNOSIS — R197 Diarrhea, unspecified: Secondary | ICD-10-CM | POA: Diagnosis not present

## 2015-04-17 DIAGNOSIS — E669 Obesity, unspecified: Secondary | ICD-10-CM | POA: Insufficient documentation

## 2015-04-17 DIAGNOSIS — R05 Cough: Secondary | ICD-10-CM | POA: Diagnosis not present

## 2015-04-17 DIAGNOSIS — R079 Chest pain, unspecified: Secondary | ICD-10-CM | POA: Insufficient documentation

## 2015-04-17 DIAGNOSIS — R109 Unspecified abdominal pain: Secondary | ICD-10-CM | POA: Diagnosis not present

## 2015-04-17 DIAGNOSIS — G932 Benign intracranial hypertension: Secondary | ICD-10-CM

## 2015-04-17 DIAGNOSIS — Z792 Long term (current) use of antibiotics: Secondary | ICD-10-CM | POA: Insufficient documentation

## 2015-04-17 DIAGNOSIS — K219 Gastro-esophageal reflux disease without esophagitis: Secondary | ICD-10-CM | POA: Insufficient documentation

## 2015-04-17 DIAGNOSIS — M1990 Unspecified osteoarthritis, unspecified site: Secondary | ICD-10-CM | POA: Insufficient documentation

## 2015-04-17 DIAGNOSIS — Z8619 Personal history of other infectious and parasitic diseases: Secondary | ICD-10-CM | POA: Insufficient documentation

## 2015-04-17 DIAGNOSIS — Z79899 Other long term (current) drug therapy: Secondary | ICD-10-CM | POA: Diagnosis not present

## 2015-04-17 DIAGNOSIS — Z87442 Personal history of urinary calculi: Secondary | ICD-10-CM | POA: Diagnosis not present

## 2015-04-17 DIAGNOSIS — R112 Nausea with vomiting, unspecified: Secondary | ICD-10-CM | POA: Insufficient documentation

## 2015-04-17 DIAGNOSIS — E039 Hypothyroidism, unspecified: Secondary | ICD-10-CM | POA: Diagnosis not present

## 2015-04-17 DIAGNOSIS — R1110 Vomiting, unspecified: Secondary | ICD-10-CM

## 2015-04-17 LAB — CSF CELL COUNT WITH DIFFERENTIAL
RBC Count, CSF: 0 /mm3
Tube #: 1
WBC, CSF: 0 /mm3 (ref 0–5)

## 2015-04-17 LAB — CBC
HCT: 39.6 % (ref 36.0–46.0)
Hemoglobin: 13.5 g/dL (ref 12.0–15.0)
MCH: 28.2 pg (ref 26.0–34.0)
MCHC: 34.1 g/dL (ref 30.0–36.0)
MCV: 82.8 fL (ref 78.0–100.0)
Platelets: 280 10*3/uL (ref 150–400)
RBC: 4.78 MIL/uL (ref 3.87–5.11)
RDW: 13.9 % (ref 11.5–15.5)
WBC: 5.8 10*3/uL (ref 4.0–10.5)

## 2015-04-17 LAB — HEPATIC FUNCTION PANEL
ALT: 21 U/L (ref 14–54)
AST: 29 U/L (ref 15–41)
Albumin: 3.1 g/dL — ABNORMAL LOW (ref 3.5–5.0)
Alkaline Phosphatase: 82 U/L (ref 38–126)
Bilirubin, Direct: 0.2 mg/dL (ref 0.1–0.5)
Indirect Bilirubin: 0.3 mg/dL (ref 0.3–0.9)
Total Bilirubin: 0.5 mg/dL (ref 0.3–1.2)
Total Protein: 7.5 g/dL (ref 6.5–8.1)

## 2015-04-17 LAB — BASIC METABOLIC PANEL
Anion gap: 8 (ref 5–15)
BUN: 8 mg/dL (ref 6–20)
CO2: 27 mmol/L (ref 22–32)
Calcium: 8.8 mg/dL — ABNORMAL LOW (ref 8.9–10.3)
Chloride: 103 mmol/L (ref 101–111)
Creatinine, Ser: 0.71 mg/dL (ref 0.44–1.00)
GFR calc Af Amer: >60 mL/min (ref >60)
GFR calc non Af Amer: >60 mL/min (ref >60)
Glucose, Bld: 118 mg/dL — ABNORMAL HIGH (ref 65–99)
Potassium: 3.7 mmol/L (ref 3.5–5.1)
Sodium: 138 mmol/L (ref 135–145)

## 2015-04-17 LAB — I-STAT CG4 LACTIC ACID, ED: Lactic Acid, Venous: 2 mmol/L (ref 0.5–2.0)

## 2015-04-17 LAB — PROTEIN, CSF: Total  Protein, CSF: 28 mg/dL (ref 15–45)

## 2015-04-17 LAB — BRAIN NATRIURETIC PEPTIDE: B Natriuretic Peptide: 16.5 pg/mL (ref 0.0–100.0)

## 2015-04-17 LAB — I-STAT TROPONIN, ED: Troponin i, poc: 0 ng/mL (ref 0.00–0.08)

## 2015-04-17 LAB — LIPASE, BLOOD: Lipase: 27 U/L (ref 22–51)

## 2015-04-17 IMAGING — CT CT HEAD W/O CM
2 series · 16 of 30 positions shown, 20 images · non-contrast
Comparison: [DATE]

CLINICAL DATA: Nausea and vomiting

EXAM:
CT HEAD WITHOUT CONTRAST
TECHNIQUE: Contiguous axial images were obtained from the base of the skull
through the vertex without intravenous contrast.

[Series 201: head w/o, idose (1) · axial · non-contrast · 0.49mm/px · z∈[+82,+207]mm · 13 of 31 slices shown, 17 images]
[im 3/31  brain]
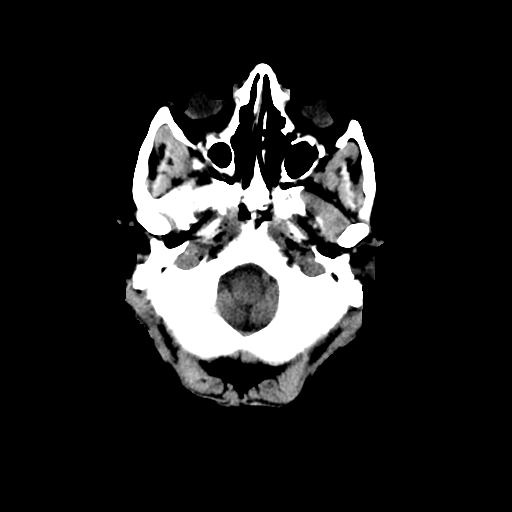
[im 3/31  bone]
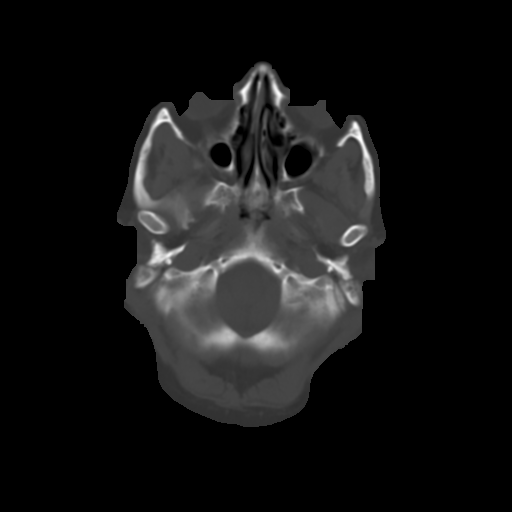
[im 5/31  brain]
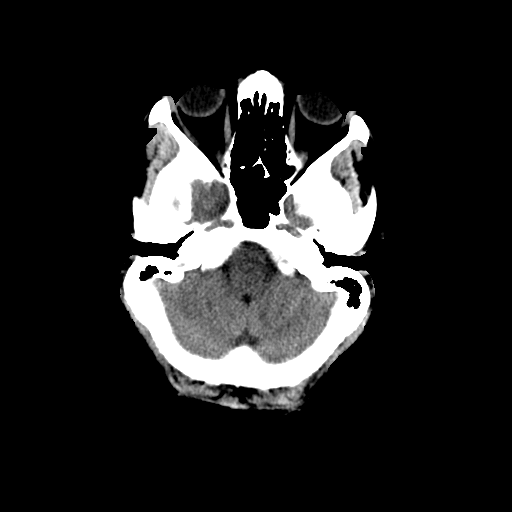
[im 7/31  brain]
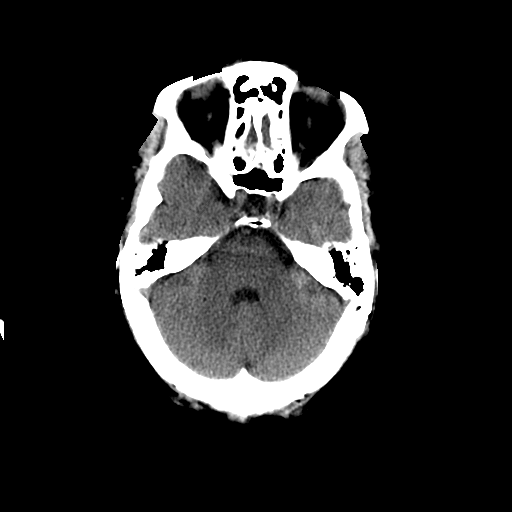
[im 9/31  brain]
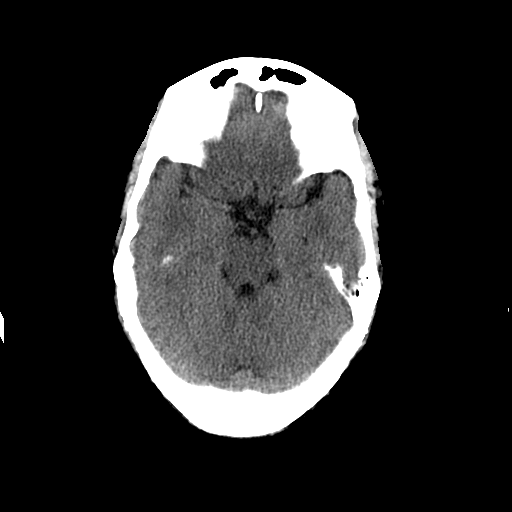
[im 11/31  brain]
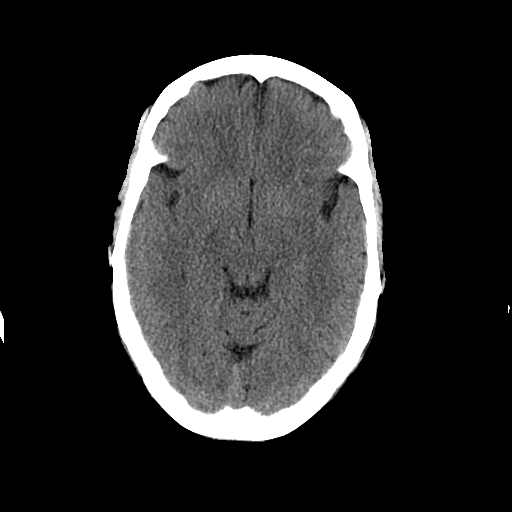
[im 11/31  bone]
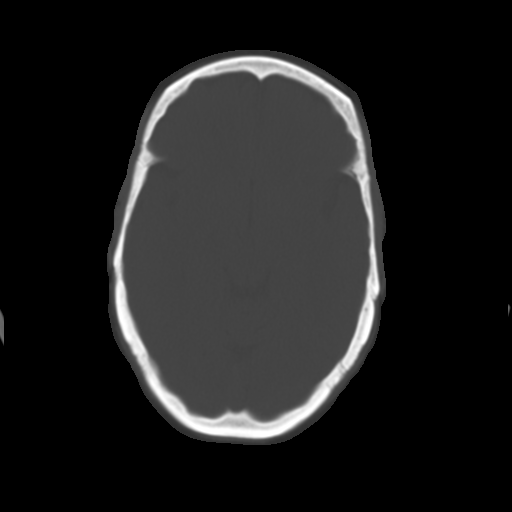
[im 13/31  brain]
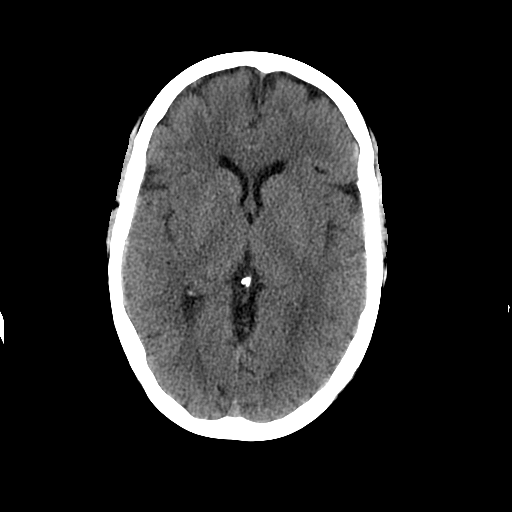
[im 16/31  brain]
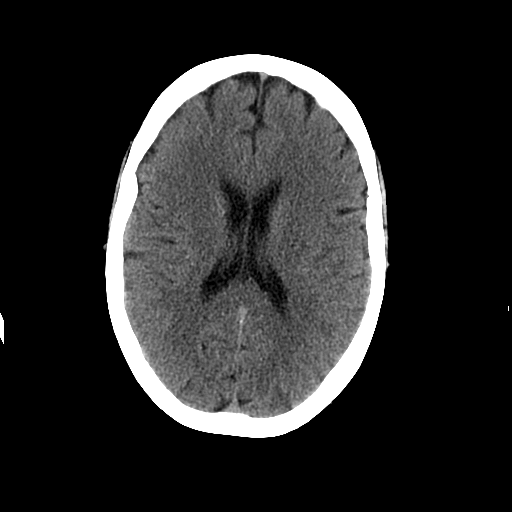
[im 18/31  brain]
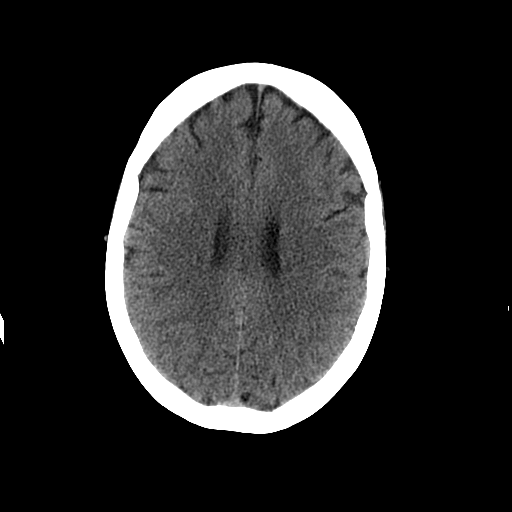
[im 20/31  brain]
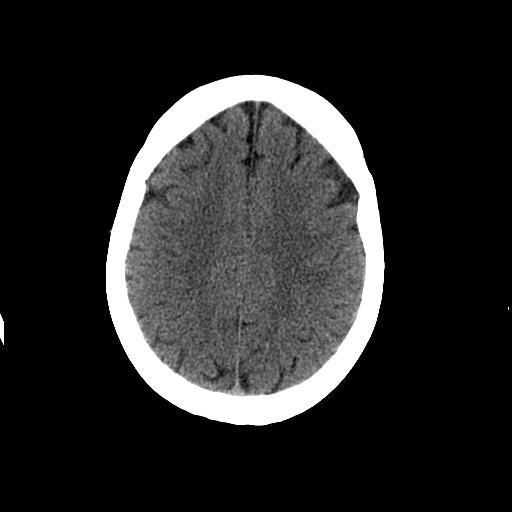
[im 20/31  bone]
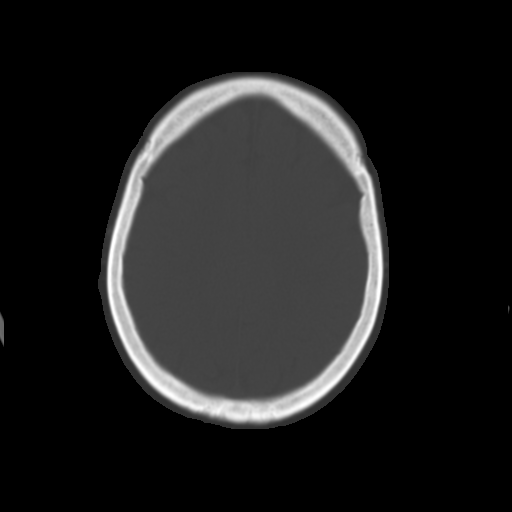
[im 22/31  brain]
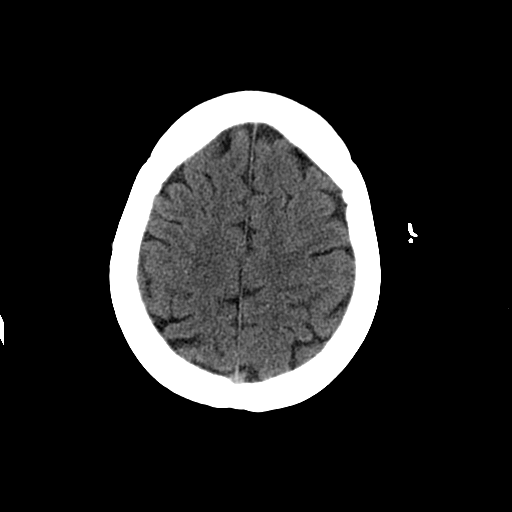
[im 24/31  brain]
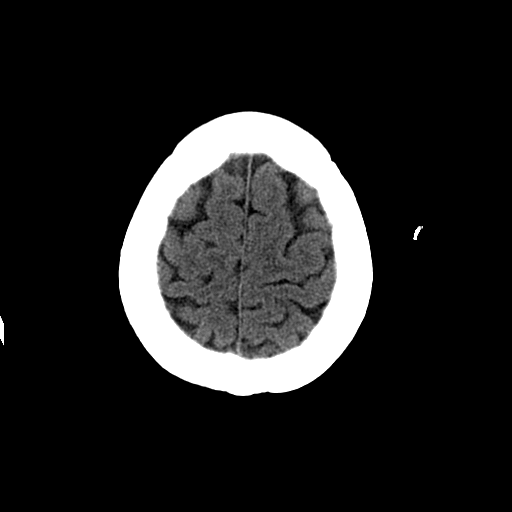
[im 26/31  brain]
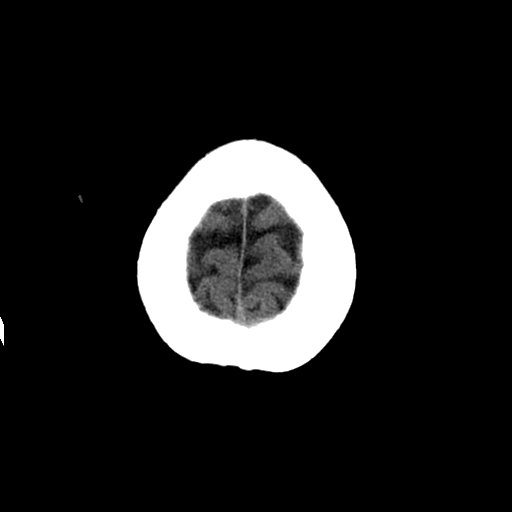
[im 28/31  brain]
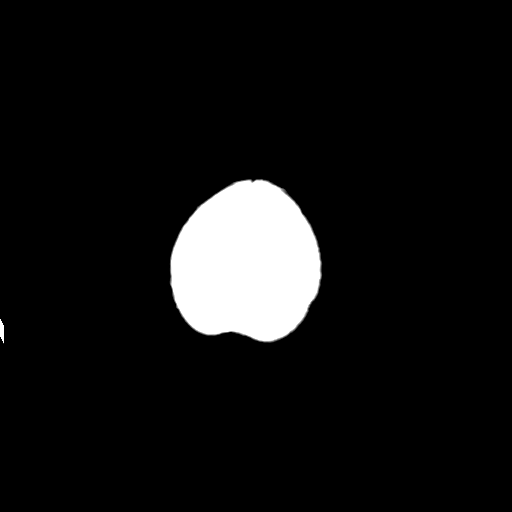
[im 28/31  bone]
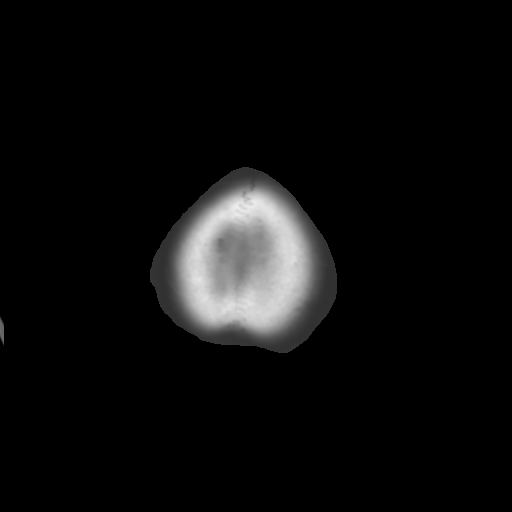

[Series 202: head w/o bone, idose (1) · axial · non-contrast · 0.49mm/px · z∈[+82,+122]mm · 3 of 31 slices shown]
[im 3/31  bone]
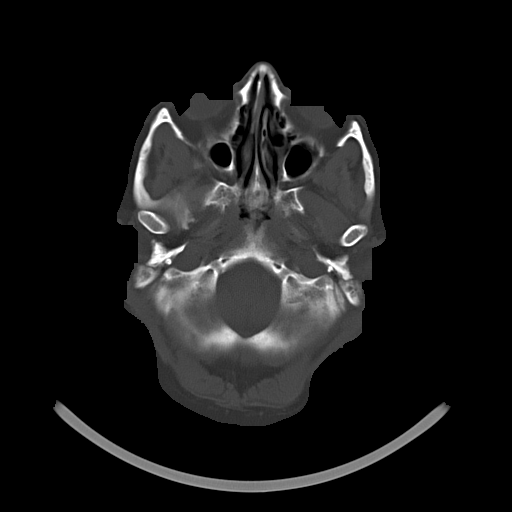
[im 7/31  bone]
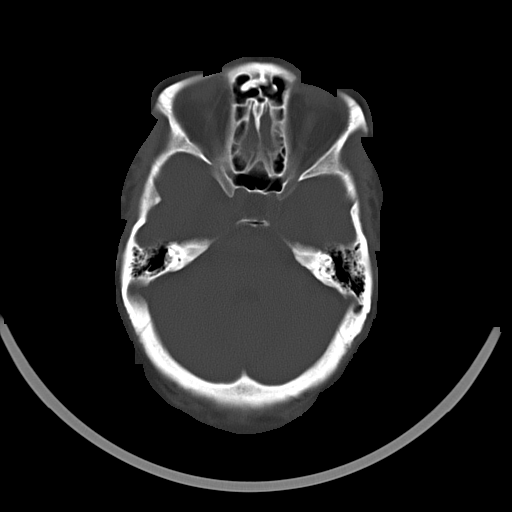
[im 11/31  bone]
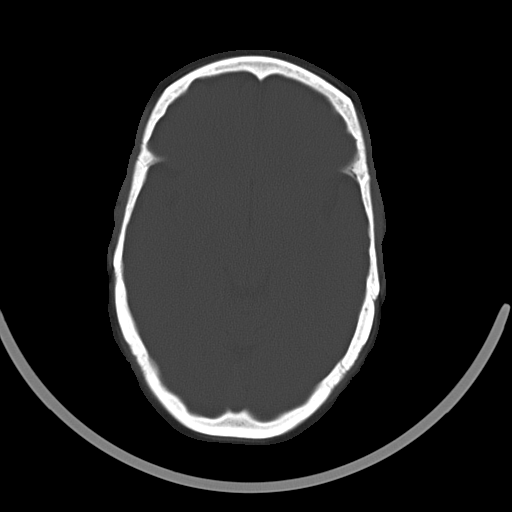

[16 of 30 positions shown; findings below may reference images not displayed]

FINDINGS: The bony calvarium is intact. The ventricles are of normal size and
configuration. No findings to suggest acute hemorrhage, acute
infarction or space-occupying mass lesion are noted.
IMPRESSION: No acute intracranial abnormality noted.

## 2015-04-17 IMAGING — DX DG CHEST 2V
2 series · 2 of 2 positions shown · non-contrast
Comparison: [DATE]

CLINICAL DATA: Cough, shortness of breath and weakness.

EXAM:
CHEST  2 VIEW

[chest pa]
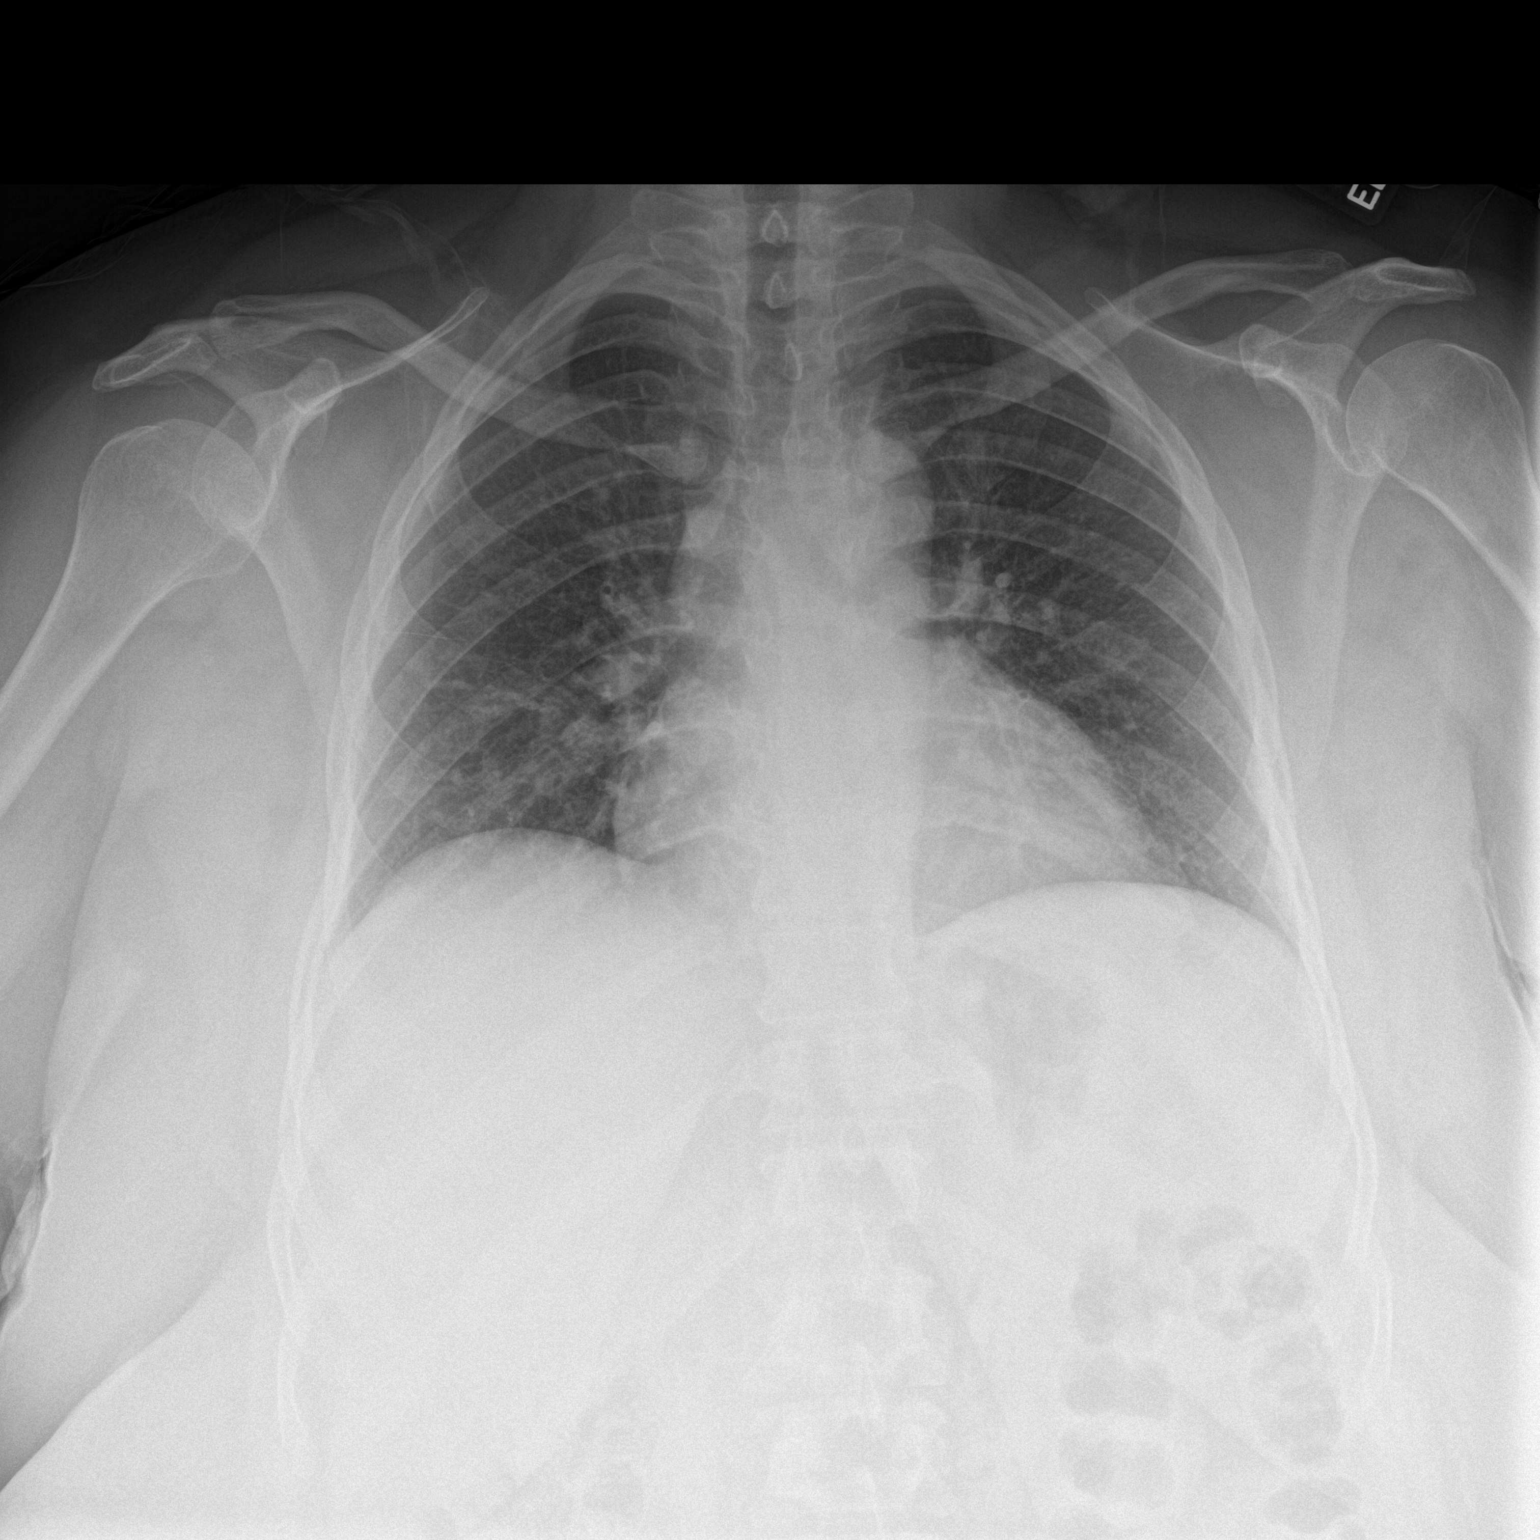

[chest lat]
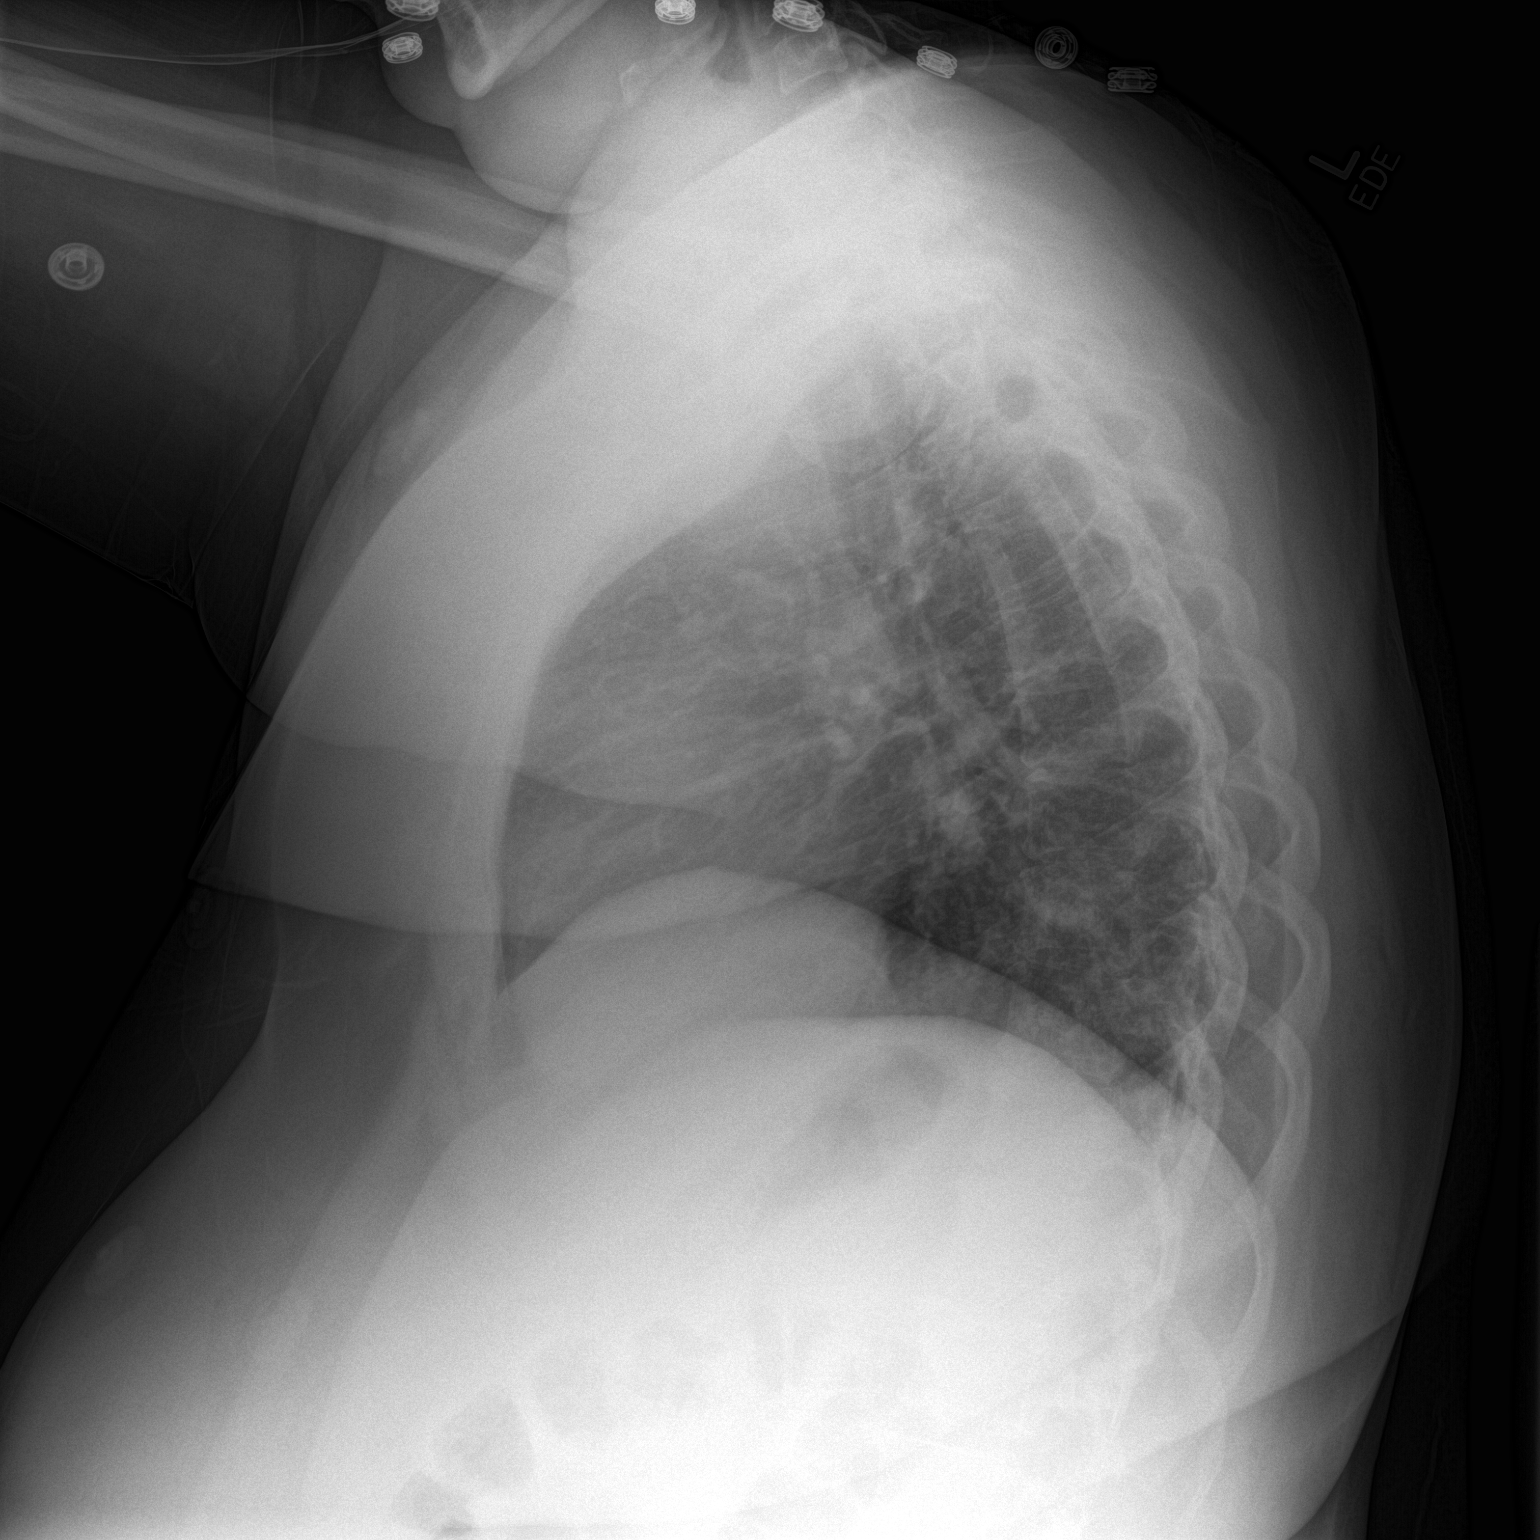

[2 of 2 positions shown; findings below may reference images not displayed]

FINDINGS: Two views of the chest demonstrate slightly low lung volumes. No
focal airspace disease. Interstitial and central vascular markings
are slightly prominent but this could be related to low lung
volumes. Heart size is normal. No definite pleural effusions. No
acute bone abnormality.
IMPRESSION: No acute chest abnormality.

## 2015-04-17 MED ORDER — ONDANSETRON HCL 4 MG/2ML IJ SOLN
4.0000 mg | Freq: Once | INTRAMUSCULAR | Status: AC
  Administered 2015-04-17: 4 mg via INTRAVENOUS
  Filled 2015-04-17: qty 2

## 2015-04-17 MED ORDER — SODIUM CHLORIDE 0.9 % IV BOLUS (SEPSIS)
500.0000 mL | Freq: Once | INTRAVENOUS | Status: AC
  Administered 2015-04-17: 500 mL via INTRAVENOUS

## 2015-04-17 MED ORDER — ONDANSETRON 4 MG PO TBDP: 4.0000 mg | ORAL_TABLET | Freq: Three times a day (TID) | ORAL | Status: DC | PRN

## 2015-04-17 MED ORDER — ACETAMINOPHEN 500 MG PO TABS: 500.0000 mg | ORAL_TABLET | Freq: Four times a day (QID) | ORAL | Status: DC | PRN

## 2015-04-17 MED ORDER — ACETAMINOPHEN 325 MG PO TABS
650.0000 mg | ORAL_TABLET | Freq: Once | ORAL | Status: AC
  Administered 2015-04-17: 650 mg via ORAL
  Filled 2015-04-17: qty 2

## 2015-04-17 MED ORDER — FENTANYL CITRATE (PF) 100 MCG/2ML IJ SOLN
50.0000 ug | Freq: Once | INTRAMUSCULAR | Status: AC
  Administered 2015-04-17: 50 ug via INTRAVENOUS
  Filled 2015-04-17: qty 2

## 2015-04-17 MED ORDER — OMEPRAZOLE 40 MG PO CPDR: 40.0000 mg | DELAYED_RELEASE_CAPSULE | Freq: Every day | ORAL | Status: DC

## 2015-04-17 NOTE — Discharge Instructions (Signed)
As discussed, it is important that you follow up with your physician in three days to ensure that your condition is improving.  If you develop any new, or concerning changes in your condition, please return to the emergency department immediately.

## 2015-04-17 NOTE — ED Provider Notes (Signed)
CSN: [id number]     Arrival date & time 04/17/15  9163 History   First MD Initiated Contact with Patient 04/17/15 [phone]     Chief Complaint  Patient presents with  . Chest Pain  . Chills  . Cough  . Emesis     (Consider location/radiation/quality/duration/timing/severity/associated sxs/prior Treatment) HPI  Patient presents with multiple complaints. History is provided by the patient, with assistance of her daughter as a Nurse, learning disability, as well as by the patient's husband. About 3 days ago the patient developed difficult to breathing, nausea, vomiting, diaphoresis. She describes whole-body drowning sensation, numbness. She has had multiple episodes of diarrhea, though none in the past 24 hours. Prior to this she was generally well. Patient has multiple medical issues, including pseudotumor cerebri.  Last therapeutic lumbar puncture was about one year ago. Patient typically has these every 6 months. Patient also has history of colitis.  No syncope, confusion, no clear alleviating or exacerbating factors.     Past Medical History  Diagnosis Date  . Abnormal finding in CSF   . Low blood pressure   . Blindness - both eyes   . GERD (gastroesophageal reflux disease)   . Obesity   . Pseudomembranous colitis   . Pseudotumor cerebri   . Kidney stones   . Arthritis   . Hypothyroidism   . Helicobacter pylori gastritis 11/20/2014   Past Surgical History  Procedure Laterality Date  . Tonsillectomy     Family History  Problem Relation Age of Onset  . Diabetes Brother   . Heart disease Paternal Grandfather   . Kidney Stones Maternal Uncle   . Diabetes Maternal Grandmother   . Diabetes Sister    History  Substance Use Topics  . Smoking status: Never Smoker   . Smokeless tobacco: Never Used  . Alcohol Use: No   OB History    No data available     Review of Systems  Constitutional:       Per HPI, otherwise negative  HENT:       Per HPI, otherwise negative  Eyes:        Patient is blind, seemingly from pseudotumor complications  Respiratory:       Per HPI, otherwise negative  Cardiovascular:       Per HPI, otherwise negative  Gastrointestinal: Positive for nausea, vomiting, abdominal pain and diarrhea.  Endocrine:       Negative aside from HPI  Genitourinary:       Neg aside from HPI   Musculoskeletal:       Per HPI, otherwise negative  Skin: Negative.   Neurological: Positive for weakness and headaches. Negative for dizziness and syncope.      Allergies  Review of patient's allergies indicates no known allergies.  Home Medications   Prior to Admission medications   Medication Sig Start Date End Date Taking? Authorizing Provider  albuterol (PROAIR HFA) 108 (90 BASE) MCG/ACT inhaler Inhale 2 puffs into the lungs 2 (two) times daily.    Historical Provider, MD  amoxicillin (AMOXIL) 500 MG capsule Take 2 capsules (1,000 mg total) by mouth 2 (two) times daily. 11/21/14   Iva Boop, MD  celecoxib (CELEBREX) 200 MG capsule Take 200 mg by mouth 2 (two) times daily.    Historical Provider, MD  clarithromycin (BIAXIN) 500 MG tablet Take 1 tablet (500 mg total) by mouth 2 (two) times daily. 11/21/14   Iva Boop, MD  ibuprofen (ADVIL,MOTRIN) 800 MG tablet Take 800 mg by mouth 2 (  two) times daily.    Historical Provider, MD  levothyroxine (SYNTHROID, LEVOTHROID) 75 MCG tablet Take 75 mcg by mouth daily before breakfast.    Historical Provider, MD  lisinopril (PRINIVIL,ZESTRIL) 20 MG tablet Take 20 mg by mouth daily.    Historical Provider, MD  nitroGLYCERIN (NITROSTAT) 0.4 MG SL tablet Place 1 tablet (0.4 mg total) under the tongue every 5 (five) minutes as needed for chest pain. Patient not taking: Reported on 11/12/2014 08/16/14   Renae Fickle, MD  omeprazole (PRILOSEC) 20 MG capsule Take 20 mg by mouth daily. 08/16/14   Historical Provider, MD  omeprazole (PRILOSEC) 40 MG capsule Take 40 mg by mouth daily.    Historical Provider, MD   BP 124/63  mmHg  Pulse 83  Temp(Src) 97.7 F (36.5 C) (Oral)  Resp 16  SpO2 100%  LMP 04/03/2015 (Exact Date) Physical Exam  Constitutional: She is oriented to person, place, and time. She appears well-developed and well-nourished. No distress.  Obese, uncomfortable appearing female  HENT:  Head: Normocephalic and atraumatic.  Eyes: Conjunctivae and EOM are normal.  Patient does not track, is blind  Cardiovascular: Normal rate and regular rhythm.   Pulmonary/Chest: Effort normal and breath sounds normal. No stridor. No respiratory distress.  Abdominal: She exhibits no distension.  Though the patient describes discomfort, no tenderness to palpation anywhere, no guarding, rebound  Musculoskeletal: She exhibits no edema.  Neurological: She is alert and oriented to person, place, and time. A cranial nerve deficit is present. She exhibits normal muscle tone. Coordination normal.  Skin: Skin is warm and dry.  Psychiatric: She has a normal mood and affect.  Nursing note and vitals reviewed.   ED Course  Procedures (including critical care time) Labs Review Labs Reviewed  BASIC METABOLIC PANEL - Abnormal; Notable for the following:    Glucose, Bld 118 (*)    Calcium 8.8 (*)    All other components within normal limits  HEPATIC FUNCTION PANEL - Abnormal; Notable for the following:    Albumin 3.1 (*)    All other components within normal limits  CBC  BRAIN NATRIURETIC PEPTIDE  LIPASE, BLOOD  URINALYSIS, ROUTINE W REFLEX MICROSCOPIC (NOT AT [facility])  Rosezena Sensor, ED  I-STAT CG4 LACTIC ACID, ED  POC URINE PREG, ED    Imaging Review Dg Chest 2 View  04/17/2015   CLINICAL DATA:  Cough, shortness of breath and weakness.  EXAM: CHEST  2 VIEW  COMPARISON:  08/16/2014  FINDINGS: Two views of the chest demonstrate slightly low lung volumes. No focal airspace disease. Interstitial and central vascular markings are slightly prominent but this could be related to low lung volumes. Heart size is  normal. No definite pleural effusions. No acute bone abnormality.  IMPRESSION: No acute chest abnormality.   Electronically Signed   By: Richarda Overlie M.D.   On: 04/17/2015 07:59   Ct Head Wo Contrast  04/17/2015   CLINICAL DATA:  Nausea and vomiting  EXAM: CT HEAD WITHOUT CONTRAST  TECHNIQUE: Contiguous axial images were obtained from the base of the skull through the vertex without intravenous contrast.  COMPARISON:  07/14/2011  FINDINGS: The bony calvarium is intact. The ventricles are of normal size and configuration. No findings to suggest acute hemorrhage, acute infarction or space-occupying mass lesion are noted.  IMPRESSION: No acute intracranial abnormality noted.   Electronically Signed   By: Alcide Clever M.D.   On: 04/17/2015 08:09     EKG Interpretation   Date/Time:  Friday April 17 2015 07:04:53 EDT Ventricular Rate:  77 PR Interval:  142 QRS Duration: 103 QT Interval:  383 QTC Calculation: 433 R Axis:   49 Text Interpretation:  Sinus rhythm Low voltage, precordial leads Sinus  rhythm Artifact T wave abnormality Abnormal ekg Confirmed by Gerhard Munch  MD ([id number]) on 04/17/2015 8:23:04 AM     On exam the patient appears generally better. Chart review demonstrates the patient has had fluoroscopic lumbar puncture over the past decade, periodically. She has also been evaluated for abdominal pain in the past year, with reassuring results.  Initial labs somewhat reassuring.  On repeat exam the patient appears substantially better.   On return from interventional radiology, the patient is now much more comfortable, she is sleeping.    MDM   Final diagnoses:  Vomiting  Nausea and vomiting   This patient with a history of pseudotumor cerebri now presents with abdominal pain, chills, nausea, generalized complaints. Patient's initial visit exam demonstrates uncomfortable young female, but moving all extremity spontaneously, with no evidence for bacteremia or  sepsis. Patient's radiographic studies were reassuring, labs are reassuring. Patient had successful lumbar puncture, with removal of fluid after she had a bit of pressure during opening. Patient's symptoms resolved.  Patient will follow-up with primary care.   Gerhard Munch, MD 04/18/15 [phone]

## 2015-04-17 NOTE — Procedures (Signed)
Informed consent was obtained from the patient prior to the procedure, including potential complications of headache, allergy, and pain. With the patient prone, the lower back was prepped with Betadine. 1% Lidocaine was used for local anesthesia. Lumbar puncture was performed at the L4-L5 level using a gauge needle with return of clear CSF with an opening pressure of 19.5 cm water. 18 ml of CSF were obtained for laboratory studies. The patient tolerated the procedure well and there were no apparent complications.

## 2015-04-17 NOTE — ED Notes (Signed)
Pt made aware that urine specimen is needed. Pt unable to provide sample at this time

## 2015-04-17 NOTE — ED Notes (Signed)
Pt still in IR.

## 2015-04-17 NOTE — ED Notes (Signed)
Pt ambulated with slow steady gait in the room.

## 2015-04-17 NOTE — ED Notes (Signed)
Pt from home with multiple complaints.  Pt reports waking up with SOB "feeling like drowning," emesis x 3 days, chills, nonproductive cough, chest pain "like drowning," and whole body numbness especially in her back.  Denies diarrhea in the last 24 hours.  Lungs clear.  Pt in NAD, A&O.

## 2015-04-17 NOTE — ED Notes (Signed)
Dr Romeo Apple at bedside prior to discharge.

## 2015-04-17 NOTE — ED Provider Notes (Signed)
6:18 PM Pt feeling better, would like to go home. CSF reviewed and non-contrib. Will plan on d/c home.   Purvis Sheffield, MD 04/17/15 1818

## 2015-04-17 NOTE — ED Notes (Signed)
IR to come for pt at 1300.

## 2015-04-20 LAB — CSF CULTURE W GRAM STAIN

## 2015-04-20 LAB — CSF CULTURE: Culture: NO GROWTH

## 2015-09-28 ENCOUNTER — Emergency Department (HOSPITAL_COMMUNITY): Payer: Medicaid Other

## 2015-09-28 ENCOUNTER — Encounter (HOSPITAL_COMMUNITY): Payer: Self-pay | Admitting: Emergency Medicine

## 2015-09-28 ENCOUNTER — Emergency Department (HOSPITAL_COMMUNITY)
Admission: EM | Admit: 2015-09-28 | Discharge: 2015-09-28 | Disposition: A | Discharge status: Home / Self Care | Payer: Medicaid Other | Attending: Emergency Medicine | Admitting: Emergency Medicine

## 2015-09-28 DIAGNOSIS — H540 Blindness, both eyes: Secondary | ICD-10-CM | POA: Diagnosis not present

## 2015-09-28 DIAGNOSIS — K219 Gastro-esophageal reflux disease without esophagitis: Secondary | ICD-10-CM | POA: Diagnosis not present

## 2015-09-28 DIAGNOSIS — E039 Hypothyroidism, unspecified: Secondary | ICD-10-CM | POA: Insufficient documentation

## 2015-09-28 DIAGNOSIS — E669 Obesity, unspecified: Secondary | ICD-10-CM | POA: Diagnosis not present

## 2015-09-28 DIAGNOSIS — I951 Orthostatic hypotension: Secondary | ICD-10-CM | POA: Insufficient documentation

## 2015-09-28 DIAGNOSIS — M1990 Unspecified osteoarthritis, unspecified site: Secondary | ICD-10-CM | POA: Diagnosis not present

## 2015-09-28 DIAGNOSIS — E860 Dehydration: Secondary | ICD-10-CM | POA: Insufficient documentation

## 2015-09-28 DIAGNOSIS — Z87442 Personal history of urinary calculi: Secondary | ICD-10-CM | POA: Insufficient documentation

## 2015-09-28 DIAGNOSIS — Z8619 Personal history of other infectious and parasitic diseases: Secondary | ICD-10-CM | POA: Insufficient documentation

## 2015-09-28 DIAGNOSIS — Z79899 Other long term (current) drug therapy: Secondary | ICD-10-CM | POA: Insufficient documentation

## 2015-09-28 DIAGNOSIS — R42 Dizziness and giddiness: Secondary | ICD-10-CM | POA: Diagnosis present

## 2015-09-28 LAB — BASIC METABOLIC PANEL
Anion gap: 6 (ref 5–15)
BUN: 11 mg/dL (ref 6–20)
CO2: 28 mmol/L (ref 22–32)
Calcium: 9 mg/dL (ref 8.9–10.3)
Chloride: 107 mmol/L (ref 101–111)
Creatinine, Ser: 0.65 mg/dL (ref 0.44–1.00)
GFR calc Af Amer: >60 mL/min (ref >60)
GFR calc non Af Amer: >60 mL/min (ref >60)
Glucose, Bld: 128 mg/dL — ABNORMAL HIGH (ref 65–99)
Potassium: 3.4 mmol/L — ABNORMAL LOW (ref 3.5–5.1)
Sodium: 141 mmol/L (ref 135–145)

## 2015-09-28 LAB — URINALYSIS, ROUTINE W REFLEX MICROSCOPIC
Bilirubin Urine: NEGATIVE
Glucose, UA: NEGATIVE mg/dL
Hgb urine dipstick: NEGATIVE
Ketones, ur: NEGATIVE mg/dL
Leukocytes, UA: NEGATIVE
Nitrite: NEGATIVE
Protein, ur: NEGATIVE mg/dL
Specific Gravity, Urine: 1.023 (ref 1.005–1.030)
pH: 6 (ref 5.0–8.0)

## 2015-09-28 LAB — CBC
HCT: 41.1 % (ref 36.0–46.0)
Hemoglobin: 13.6 g/dL (ref 12.0–15.0)
MCH: 27.4 pg (ref 26.0–34.0)
MCHC: 33.1 g/dL (ref 30.0–36.0)
MCV: 82.9 fL (ref 78.0–100.0)
Platelets: 250 10*3/uL (ref 150–400)
RBC: 4.96 MIL/uL (ref 3.87–5.11)
RDW: 13.4 % (ref 11.5–15.5)
WBC: 6.1 10*3/uL (ref 4.0–10.5)

## 2015-09-28 LAB — I-STAT TROPONIN, ED: Troponin i, poc: 0 ng/mL (ref 0.00–0.08)

## 2015-09-28 MED ORDER — SODIUM CHLORIDE 0.9 % IV BOLUS (SEPSIS)
1000.0000 mL | Freq: Once | INTRAVENOUS | Status: AC
  Administered 2015-09-28: 1000 mL via INTRAVENOUS

## 2015-09-28 NOTE — Discharge Instructions (Signed)

## 2015-09-28 NOTE — ED Provider Notes (Signed)
CSN: [id number]     Arrival date & time 09/28/15  1805 History   First MD Initiated Contact with Patient 09/28/15 1845     Chief Complaint  Patient presents with  . Hypotension  . Near Syncope  . Dizziness     (Consider location/radiation/quality/duration/timing/severity/associated sxs/prior Treatment) Patient is a 49 y.o. female presenting with near-syncope. The history is provided by the patient.  Near Syncope This is a new problem. The current episode started 12 to 24 hours ago. The problem occurs rarely. The problem has not changed since onset.Pertinent negatives include no chest pain, no abdominal pain and no shortness of breath. Nothing aggravates the symptoms. Nothing relieves the symptoms. She has tried nothing for the symptoms.    Past Medical History  Diagnosis Date  . Abnormal finding in CSF   . Low blood pressure   . Blindness - both eyes   . GERD (gastroesophageal reflux disease)   . Obesity   . Pseudomembranous colitis   . Pseudotumor cerebri   . Kidney stones   . Arthritis   . Hypothyroidism   . Helicobacter pylori gastritis 11/20/2014   Past Surgical History  Procedure Laterality Date  . Tonsillectomy     Family History  Problem Relation Age of Onset  . Diabetes Brother   . Heart disease Paternal Grandfather   . Kidney Stones Maternal Uncle   . Diabetes Maternal Grandmother   . Diabetes Sister    Social History  Substance Use Topics  . Smoking status: Never Smoker   . Smokeless tobacco: Never Used  . Alcohol Use: No   OB History    No data available     Review of Systems  Respiratory: Negative for shortness of breath.   Cardiovascular: Positive for near-syncope. Negative for chest pain.  Gastrointestinal: Negative for abdominal pain.  All other systems reviewed and are negative.     Allergies  Review of patient's allergies indicates no known allergies.  Home Medications   Prior to Admission medications   Medication Sig Start Date End  Date Taking? Authorizing Provider  acetaminophen (TYLENOL) 500 MG tablet Take 1 tablet (500 mg total) by mouth every 6 (six) hours as needed. 04/17/15   Gerhard Munchobert Lockwood, MD  albuterol (PROAIR HFA) 108 (90 BASE) MCG/ACT inhaler Inhale 2 puffs into the lungs 2 (two) times daily.    Historical Provider, MD  levothyroxine (SYNTHROID, LEVOTHROID) 75 MCG tablet Take 75 mcg by mouth daily before breakfast.    Historical Provider, MD  lisinopril (PRINIVIL,ZESTRIL) 20 MG tablet Take 20 mg by mouth daily.    Historical Provider, MD  nitroGLYCERIN (NITROSTAT) 0.4 MG SL tablet Place 1 tablet (0.4 mg total) under the tongue every 5 (five) minutes as needed for chest pain. 08/16/14   Renae FickleMackenzie Short, MD  omeprazole (PRILOSEC) 40 MG capsule Take 1 capsule (40 mg total) by mouth daily. 04/17/15   Gerhard Munchobert Lockwood, MD  ondansetron (ZOFRAN ODT) 4 MG disintegrating tablet Take 1 tablet (4 mg total) by mouth every 8 (eight) hours as needed for nausea or vomiting. 04/17/15   Gerhard Munchobert Lockwood, MD  simvastatin (ZOCOR) 20 MG tablet Take 20 mg by mouth daily at 6 PM.     Historical Provider, MD   BP 150/98 mmHg  Pulse 66  Temp(Src) 97.7 F (36.5 C) (Oral)  SpO2 100%  LMP 08/13/2015 (Approximate) Physical Exam  Constitutional: She is oriented to person, place, and time. She appears well-developed and well-nourished. No distress.  HENT:  Head: Normocephalic.  Eyes:  Conjunctivae are normal.  Neck: Neck supple. No tracheal deviation present.  Cardiovascular: Normal rate, regular rhythm and normal heart sounds.   Pulmonary/Chest: Effort normal and breath sounds normal. No respiratory distress.  Abdominal: Soft. She exhibits no distension. There is no tenderness.  Neurological: She is alert and oriented to person, place, and time.  Skin: Skin is warm and dry.  Psychiatric: She has a normal mood and affect.    ED Course  Procedures (including critical care time) Labs Review Labs Reviewed  BASIC METABOLIC PANEL -  Abnormal; Notable for the following:    Potassium 3.4 (*)    Glucose, Bld 128 (*)    All other components within normal limits  URINE CULTURE  CBC  URINALYSIS, ROUTINE W REFLEX MICROSCOPIC (NOT AT [facility])  Rosezena Sensor, ED    Imaging Review Dg Chest 2 View  09/28/2015  CLINICAL DATA:  Acute onset of dizziness and lightheadedness. Hypotension. Initial encounter. EXAM: CHEST  2 VIEW COMPARISON:  Chest radiograph performed 04/17/2015 FINDINGS: The lungs are well-aerated. Pulmonary vascularity is at the upper limits of normal. There is no evidence of focal opacification, pleural effusion or pneumothorax. The heart is borderline normal in size. No acute osseous abnormalities are seen. IMPRESSION: No acute cardiopulmonary process seen. Electronically Signed   By: Roanna Raider M.D.   On: 09/28/2015 19:00   I have personally reviewed and evaluated these images and lab results as part of my medical decision-making.   EKG Interpretation   Date/Time:  Monday September 28 2015 18:38:35 EST Ventricular Rate:  65 PR Interval:  141 QRS Duration: 99 QT Interval:  408 QTC Calculation: 424 R Axis:   26 Text Interpretation:  Sinus rhythm Low voltage, precordial leads No  significant change since last tracing Reconfirmed by Armetta Henri MD, Reuel Boom  ([id number]) on 09/28/2015 6:46:57 PM      MDM   Final diagnoses:  Orthostatic hypotension  Moderate dehydration    49 y.o. female presents with near syncopal episode on a plane ride from [place] and ongoing lightheadedness with standing. Has mild orthostasis on arrival with drop into 90s systolic with standing. Had been fasting for multiple days prior to departure. No chest pain, leg swelling, Shob, tachycardia, or other symptoms typical for PE. Given IVF with good response. No evidence of infection. Suspect dehydration 2/2 poor po intake with recent travel and fasting. Plan to follow up with PCP as needed and return precautions discussed for worsening or new  concerning symptoms.     Lyndal Pulley, MD 09/29/15 [phone]

## 2015-09-28 NOTE — ED Notes (Signed)
Pt ambulated without problem. 

## 2015-09-28 NOTE — ED Notes (Addendum)
Pt reports recent issues with hypotension. Lowest seen was 80s/30s. Daughter says, "she was seen here a few times before but they didn't give us a reason for her blood pressure being low." Is not on blood pressure medication at this time that would cause BP to drop. Endorses dizziness and a syncopal episode today. Ambulatory with steady gait. No other recent issues. Returned from [place] and [place] recently.

## 2015-09-28 NOTE — ED Notes (Signed)
Bed: WA12 Expected date:  Expected time:  Means of arrival:  Comments: Triage 2 

## 2015-09-30 ENCOUNTER — Emergency Department (HOSPITAL_COMMUNITY): Payer: Medicaid Other

## 2015-09-30 ENCOUNTER — Emergency Department (HOSPITAL_COMMUNITY)
Admission: EM | Admit: 2015-09-30 | Discharge: 2015-09-30 | Disposition: A | Discharge status: Home / Self Care | Payer: Medicaid Other | Attending: Emergency Medicine | Admitting: Emergency Medicine

## 2015-09-30 DIAGNOSIS — E669 Obesity, unspecified: Secondary | ICD-10-CM | POA: Insufficient documentation

## 2015-09-30 DIAGNOSIS — R55 Syncope and collapse: Secondary | ICD-10-CM | POA: Diagnosis not present

## 2015-09-30 DIAGNOSIS — Z79899 Other long term (current) drug therapy: Secondary | ICD-10-CM | POA: Insufficient documentation

## 2015-09-30 DIAGNOSIS — M1990 Unspecified osteoarthritis, unspecified site: Secondary | ICD-10-CM | POA: Insufficient documentation

## 2015-09-30 DIAGNOSIS — H540 Blindness, both eyes: Secondary | ICD-10-CM | POA: Insufficient documentation

## 2015-09-30 DIAGNOSIS — R112 Nausea with vomiting, unspecified: Secondary | ICD-10-CM | POA: Diagnosis not present

## 2015-09-30 DIAGNOSIS — K219 Gastro-esophageal reflux disease without esophagitis: Secondary | ICD-10-CM | POA: Diagnosis not present

## 2015-09-30 DIAGNOSIS — Z87442 Personal history of urinary calculi: Secondary | ICD-10-CM | POA: Diagnosis not present

## 2015-09-30 DIAGNOSIS — Z8619 Personal history of other infectious and parasitic diseases: Secondary | ICD-10-CM | POA: Diagnosis not present

## 2015-09-30 DIAGNOSIS — R51 Headache: Secondary | ICD-10-CM | POA: Diagnosis present

## 2015-09-30 DIAGNOSIS — R5383 Other fatigue: Secondary | ICD-10-CM | POA: Insufficient documentation

## 2015-09-30 DIAGNOSIS — R519 Headache, unspecified: Secondary | ICD-10-CM

## 2015-09-30 LAB — CBC WITH DIFFERENTIAL/PLATELET
Basophils Absolute: 0 10*3/uL (ref 0.0–0.1)
Basophils Relative: 0 %
Eosinophils Absolute: 0.4 10*3/uL (ref 0.0–0.7)
Eosinophils Relative: 6 %
HCT: 42.2 % (ref 36.0–46.0)
Hemoglobin: 13.9 g/dL (ref 12.0–15.0)
Lymphocytes Relative: 35 %
Lymphs Abs: 2.3 10*3/uL (ref 0.7–4.0)
MCH: 27.5 pg (ref 26.0–34.0)
MCHC: 32.9 g/dL (ref 30.0–36.0)
MCV: 83.6 fL (ref 78.0–100.0)
Monocytes Absolute: 0.7 10*3/uL (ref 0.1–1.0)
Monocytes Relative: 10 %
Neutro Abs: 3.3 10*3/uL (ref 1.7–7.7)
Neutrophils Relative %: 49 %
Platelets: 271 10*3/uL (ref 150–400)
RBC: 5.05 MIL/uL (ref 3.87–5.11)
RDW: 13.6 % (ref 11.5–15.5)
WBC: 6.6 10*3/uL (ref 4.0–10.5)

## 2015-09-30 LAB — I-STAT CHEM 8, ED
BUN: 14 mg/dL (ref 6–20)
Calcium, Ion: 1.16 mmol/L (ref 1.12–1.23)
Chloride: 104 mmol/L (ref 101–111)
Creatinine, Ser: 0.8 mg/dL (ref 0.44–1.00)
Glucose, Bld: 125 mg/dL — ABNORMAL HIGH (ref 65–99)
HCT: 43 % (ref 36.0–46.0)
Hemoglobin: 14.6 g/dL (ref 12.0–15.0)
Potassium: 3.6 mmol/L (ref 3.5–5.1)
Sodium: 143 mmol/L (ref 135–145)
TCO2: 28 mmol/L (ref 0–100)

## 2015-09-30 LAB — URINE CULTURE: Culture: 60000

## 2015-09-30 LAB — BASIC METABOLIC PANEL
Anion gap: 6 (ref 5–15)
BUN: 14 mg/dL (ref 6–20)
CO2: 28 mmol/L (ref 22–32)
Calcium: 9 mg/dL (ref 8.9–10.3)
Chloride: 107 mmol/L (ref 101–111)
Creatinine, Ser: 0.76 mg/dL (ref 0.44–1.00)
GFR calc Af Amer: >60 mL/min (ref >60)
GFR calc non Af Amer: >60 mL/min (ref >60)
Glucose, Bld: 128 mg/dL — ABNORMAL HIGH (ref 65–99)
Potassium: 3.7 mmol/L (ref 3.5–5.1)
Sodium: 141 mmol/L (ref 135–145)

## 2015-09-30 MED ORDER — KETOROLAC TROMETHAMINE 30 MG/ML IJ SOLN
30.0000 mg | Freq: Once | INTRAMUSCULAR | Status: AC
  Administered 2015-09-30: 30 mg via INTRAVENOUS
  Filled 2015-09-30: qty 1

## 2015-09-30 MED ORDER — SODIUM CHLORIDE 0.9 % IV BOLUS (SEPSIS)
1000.0000 mL | Freq: Once | INTRAVENOUS | Status: AC
  Administered 2015-09-30: 1000 mL via INTRAVENOUS

## 2015-09-30 MED ORDER — METOCLOPRAMIDE HCL 5 MG/ML IJ SOLN
10.0000 mg | Freq: Once | INTRAMUSCULAR | Status: AC
  Administered 2015-09-30: 10 mg via INTRAVENOUS
  Filled 2015-09-30: qty 2

## 2015-09-30 MED ORDER — DIPHENHYDRAMINE HCL 50 MG/ML IJ SOLN
25.0000 mg | Freq: Once | INTRAMUSCULAR | Status: AC
Start: 2015-09-30 — End: 2015-09-30
  Administered 2015-09-30: 25 mg via INTRAVENOUS
  Filled 2015-09-30: qty 1

## 2015-09-30 MED ORDER — ONDANSETRON HCL 4 MG PO TABS: 4.0000 mg | ORAL_TABLET | Freq: Four times a day (QID) | ORAL | Status: DC

## 2015-09-30 NOTE — ED Notes (Signed)
Bed: AV40WA03 Expected date:  Expected time:  Means of arrival:  Comments: EMS- 49yo F, n/v/cough, seen recently for same

## 2015-09-30 NOTE — Discharge Instructions (Signed)
Pseudotumor Cerebri  Pseudotumor cerebri, also called idiopathic intracranial hypertension, is a condition that occurs due to increased pressure within your skull.  Although some of the symptoms resemble those of a brain tumor, it is not a brain tumor. Symptoms occur when the increased pressure in your skull compresses brain structures. For example, pressure on the nerve responsible for vision (optic nerve) causes it to swell, resulting in visual symptoms.  Pseudotumor cerebri tends to occur in obese women younger than 50 years of age. However, men and children can also develop this condition.  SYMPTOMS   Symptoms of pseudotumor cerebri occur due to increased pressure within the skull. Symptoms may include:   · Headaches.  · Nausea and vomiting.  · Dizziness.  · High blood pressure.    · Ringing in the ears.  · Double or blurred vision.  · Brief episodes of complete loss of vision.  · Pain in the back, neck, or shoulders.  DIAGNOSIS   Pseudotumor cerebri is diagnosed through:  · A detailed eye exam, which can reveal a swollen optic nerve, as well as identifying issues such as blind spots in the vision.  · An MRI or CT scan to rule out other disorders that can cause similar symptoms, such as brain tumors.  · A spinal tap (lumbar puncture), which can demonstrate increased pressure within the skull.  TREATMENT   There are several ways that pseudotumor cerebri is treated, including:  · Medicines to decrease the production of spinal fluid and lower the pressure within your skull.  · Medicines to prevent or treat headaches.  · Surgery to create an opening in your optic nerve to allow excess fluid to drain out.  · Surgery to place drains (shunts) in your brain to remove excess fluid.  HOME CARE INSTRUCTIONS   · Take all medicines as directed by your health care provider.  · Go to all of your follow-up appointments.  · Lose weight if you are overweight.  SEEK MEDICAL CARE IF:  · Any symptoms come back.  · You develop  trouble with hearing, vision, balance, or your sense of smell.  · You cannot eat or drink what you need.  · You are more weak or tired than usual.    · You are losing weight without trying.  SEEK IMMEDIATE MEDICAL CARE IF:  · You have new symptoms such as vision problems or difficulty walking.    · You have a seizure.    · You have trouble breathing.    · You have a fever.        This information is not intended to replace advice given to you by your health care provider. Make sure you discuss any questions you have with your health care provider.     Document Released: 10/15/2013 Document Reviewed: 10/15/2013  Elsevier Interactive Patient Education ©2016 [employer]

## 2015-09-30 NOTE — ED Provider Notes (Signed)
CSN: [id number]     Arrival date & time 09/30/15  1846 History   First MD Initiated Contact with Patient 09/30/15 1903     Chief Complaint  Patient presents with  . Nausea  . Emesis  . Fatigue     (Consider location/radiation/quality/duration/timing/severity/associated sxs/prior Treatment) HPI Comments: History provided by daughter and patient with daughter acting as Nurse, learning disability.  Patient with history of pseudotumor cerebri presents to the ED with a chief complaint of headache, nausea, and vomiting.  Has also had associated near syncope and slurred speech.  Near syncopal episode 2 days ago.  States that BP goes up and down without reason.  States that she had slurred speech today at 5:30 pm, but this quickly resolved.  She states that she has had to have therapeutic LPs in the past.  She denies any other symptoms.  The history is provided by the patient. No language interpreter was used.    Past Medical History  Diagnosis Date  . Abnormal finding in CSF   . Low blood pressure   . Blindness - both eyes   . GERD (gastroesophageal reflux disease)   . Obesity   . Pseudomembranous colitis   . Pseudotumor cerebri   . Kidney stones   . Arthritis   . Hypothyroidism   . Helicobacter pylori gastritis 11/20/2014   Past Surgical History  Procedure Laterality Date  . Tonsillectomy     Family History  Problem Relation Age of Onset  . Diabetes Brother   . Heart disease Paternal Grandfather   . Kidney Stones Maternal Uncle   . Diabetes Maternal Grandmother   . Diabetes Sister    Social History  Substance Use Topics  . Smoking status: Never Smoker   . Smokeless tobacco: Never Used  . Alcohol Use: No   OB History    No data available     Review of Systems  Constitutional: Negative for fever and chills.  Respiratory: Negative for shortness of breath.   Cardiovascular: Negative for chest pain.  Gastrointestinal: Positive for nausea and vomiting. Negative for diarrhea and  constipation.  Genitourinary: Negative for dysuria.  Neurological: Positive for syncope and headaches.  All other systems reviewed and are negative.     Allergies  Review of patient's allergies indicates no known allergies.  Home Medications   Prior to Admission medications   Medication Sig Start Date End Date Taking? Authorizing Provider  albuterol (PROAIR HFA) 108 (90 BASE) MCG/ACT inhaler Inhale 2 puffs into the lungs 2 (two) times daily.   Yes Historical Provider, MD  ibuprofen (ADVIL,MOTRIN) 200 MG tablet Take 200 mg by mouth every 6 (six) hours as needed for headache or moderate pain.   Yes Historical Provider, MD  nitroGLYCERIN (NITROSTAT) 0.4 MG SL tablet Place 1 tablet (0.4 mg total) under the tongue every 5 (five) minutes as needed for chest pain. 08/16/14  Yes Renae Fickle, MD  omeprazole (PRILOSEC) 40 MG capsule Take 1 capsule (40 mg total) by mouth daily. 04/17/15  Yes Gerhard Munch, MD  ondansetron (ZOFRAN ODT) 4 MG disintegrating tablet Take 1 tablet (4 mg total) by mouth every 8 (eight) hours as needed for nausea or vomiting. Patient not taking: Reported on 09/30/2015 04/17/15   Gerhard Munch, MD   BP 136/75 mmHg  Pulse 70  Temp(Src) 98 F (36.7 C) (Oral)  Resp 18  SpO2 99%  LMP 08/13/2015 (Approximate) Physical Exam  Constitutional: She is oriented to person, place, and time. She appears well-developed and well-nourished.  HENT:  Head: Normocephalic and atraumatic.  Right Ear: External ear normal.  Left Ear: External ear normal.  Eyes: Conjunctivae and EOM are normal. Pupils are equal, round, and reactive to light.  Neck: Normal range of motion. Neck supple.  No pain with neck flexion, no meningismus  Cardiovascular: Normal rate, regular rhythm and normal heart sounds.  Exam reveals no gallop and no friction rub.   No murmur heard. Pulmonary/Chest: Effort normal and breath sounds normal. No respiratory distress. She has no wheezes. She has no rales. She  exhibits no tenderness.  Abdominal: Soft. She exhibits no distension and no mass. There is no tenderness. There is no rebound and no guarding.  Musculoskeletal: Normal range of motion. She exhibits no edema or tenderness.  Normal gait.  Neurological: She is alert and oriented to person, place, and time. She has normal reflexes.  CN 3-12 intact, normal finger to nose, no pronator drift, sensation and strength intact bilaterally.  Skin: Skin is warm and dry.  Psychiatric: She has a normal mood and affect. Her behavior is normal. Judgment and thought content normal.  Nursing note and vitals reviewed.   ED Course  Procedures (including critical care time) Labs Review Results for orders placed or performed during the hospital encounter of 09/30/15  CBC with Differential/Platelet  Result Value Ref Range   WBC 6.6 4.0 - 10.5 K/uL   RBC 5.05 3.87 - 5.11 MIL/uL   Hemoglobin 13.9 12.0 - 15.0 g/dL   HCT 16.1 09.6 - 04.5 %   MCV 83.6 78.0 - 100.0 fL   MCH 27.5 26.0 - 34.0 pg   MCHC 32.9 30.0 - 36.0 g/dL   RDW 40.9 81.1 - 91.4 %   Platelets 271 150 - 400 K/uL   Neutrophils Relative % 49 %   Neutro Abs 3.3 1.7 - 7.7 K/uL   Lymphocytes Relative 35 %   Lymphs Abs 2.3 0.7 - 4.0 K/uL   Monocytes Relative 10 %   Monocytes Absolute 0.7 0.1 - 1.0 K/uL   Eosinophils Relative 6 %   Eosinophils Absolute 0.4 0.0 - 0.7 K/uL   Basophils Relative 0 %   Basophils Absolute 0.0 0.0 - 0.1 K/uL  Basic metabolic panel  Result Value Ref Range   Sodium 141 135 - 145 mmol/L   Potassium 3.7 3.5 - 5.1 mmol/L   Chloride 107 101 - 111 mmol/L   CO2 28 22 - 32 mmol/L   Glucose, Bld 128 (H) 65 - 99 mg/dL   BUN 14 6 - 20 mg/dL   Creatinine, Ser 7.82 0.44 - 1.00 mg/dL   Calcium 9.0 8.9 - 95.6 mg/dL   GFR calc non Af Amer >60 >60 mL/min   GFR calc Af Amer >60 >60 mL/min   Anion gap 6 5 - 15  I-stat chem 8, ed  Result Value Ref Range   Sodium 143 135 - 145 mmol/L   Potassium 3.6 3.5 - 5.1 mmol/L   Chloride 104  101 - 111 mmol/L   BUN 14 6 - 20 mg/dL   Creatinine, Ser 2.13 0.44 - 1.00 mg/dL   Glucose, Bld 086 (H) 65 - 99 mg/dL   Calcium, Ion 5.78 4.69 - 1.23 mmol/L   TCO2 28 0 - 100 mmol/L   Hemoglobin 14.6 12.0 - 15.0 g/dL   HCT 62.9 52.8 - 41.3 %   Dg Chest 2 View  09/28/2015  CLINICAL DATA:  Acute onset of dizziness and lightheadedness. Hypotension. Initial encounter. EXAM: CHEST  2 VIEW COMPARISON:  Chest radiograph performed 04/17/2015 FINDINGS: The lungs are well-aerated. Pulmonary vascularity is at the upper limits of normal. There is no evidence of focal opacification, pleural effusion or pneumothorax. The heart is borderline normal in size. No acute osseous abnormalities are seen. IMPRESSION: No acute cardiopulmonary process seen. Electronically Signed   By: Roanna RaiderJeffery  Chang M.D.   On: 09/28/2015 19:00   Ct Head Wo Contrast  09/30/2015  CLINICAL DATA:  Headache and fatigue ; nausea and vomiting. Left-sided numbness and slurred speech EXAM: CT HEAD WITHOUT CONTRAST TECHNIQUE: Contiguous axial images were obtained from the base of the skull through the vertex without intravenous contrast. COMPARISON:  April 17, 2015 FINDINGS: The ventricles are normal in size and configuration. There is no intracranial mass hemorrhage, extra-axial fluid collection, or midline shift. Gray-white compartments are normal. There is no acute infarct evident. The bony calvarium appears intact. The mastoid air cells are clear. Visualized orbits appear normal. IMPRESSION: Study within normal limits and stable. Electronically Signed   By: Bretta BangWilliam  Woodruff III M.D.   On: 09/30/2015 20:29    I have personally reviewed and evaluated these images and lab results as part of my medical decision-making.   EKG Interpretation None      MDM   Final diagnoses:  Nonintractable headache, unspecified chronicity pattern, unspecified headache type    Patient with headache, nausea, and vomiting.  Hx of pseudotumor cerebri.  Recent  near syncope 2 days ago.  Transient slurred speech today.  Discussed with Dr. Adela LankFloyd, who recommends treating headache and reassessing.  Agrees with plan to check CT.  CT negative.  Patient feels better after headache cocktail.  Normal orthostatics.  Neurovascularly intact at this time.  Doubt need for emergent LP.  Dr. Adela LankFloyd agrees with the plan.  Will discharge to home with neurology follow-up.     Roxy Horsemanobert Daivion Pape, PA-C 09/30/15 2240  Melene Planan Floyd, DO 09/30/15 2310

## 2015-09-30 NOTE — ED Notes (Signed)
Pt arrived via EMS for Headache, nausea, vomiting, and fatigue. Pt seen here for same on Monday and diagnosed with dehydration. Per Pts family member pt is now c/o a headache that started around 1730 today. Hx of increased ICP in July per pt's daughter.

## 2015-09-30 NOTE — ED Notes (Signed)
Rn Jacqualine Codehechi will draw labs

## 2015-09-30 NOTE — ED Notes (Signed)
Patient transported to CT 

## 2015-10-01 ENCOUNTER — Telehealth (HOSPITAL_BASED_OUTPATIENT_CLINIC_OR_DEPARTMENT_OTHER): Payer: Self-pay | Admitting: Emergency Medicine

## 2015-10-01 NOTE — Telephone Encounter (Signed)
Post ED Visit - Positive Culture Follow-up  Culture report reviewed by antimicrobial stewardship pharmacist:  []  Janice Hanson, Pharm.D. []  Janice Hanson, Pharm.D., BCPS [x]  Janice Hanson, Pharm.D. []  Janice Hanson, Pharm.D., BCPS []  Janice Hanson, [place] BCPS, AAHIVP []  Janice Hanson, Pharm.D., BCPS, AAHIVP []  Janice Hanson, Pharm.D. []  Janice Hanson, [address]Pharm[address]  Positive urine culture Treated with none, no symptoms, organism sensitive to the same and no further patient follow-up is required at this time.  Janice Hanson, Janice Hanson 10/01/2015, 11:42 AM

## 2015-10-01 NOTE — Progress Notes (Signed)
ED Antimicrobial Stewardship Positive Culture Follow Up   Glendell Dockerumadur Vondrasek is an 49 y.o. female [facility] with a chief complaint of  Chief Complaint  Patient presents with  . Nausea  . Emesis  . Fatigue    Recent Results (from the past 720 hour(s))  Urine culture     Status: None   Collection Time: 09/28/15  7:33 PM  Result Value Ref Range Status   Specimen Description URINE, RANDOM  Final   Special Requests NONE  Final   Culture   Final    60,000 COLONIES/ml GROUP B STREP(S.AGALACTIAE)ISOLATED TESTING AGAINST S. AGALACTIAE NOT ROUTINELY PERFORMED DUE TO PREDICTABILITY OF AMP/PEN/VAN SUSCEPTIBILITY. [facility]    Report Status 09/30/2015 FINAL  Final   Patient has no symptoms of UTI. No treatment indicated for asymptomatic bacteruria.   ED Provider: Joycie PeekBenjamin Cartner, PA-C  Bertram MillardMichael A Kallista Pae 10/01/2015, 8:46 AM Infectious Diseases Pharmacist Phone# [phone]

## 2015-10-02 ENCOUNTER — Emergency Department (HOSPITAL_COMMUNITY)
Admission: EM | Admit: 2015-10-02 | Discharge: 2015-10-03 | Disposition: A | Discharge status: Home / Self Care | Payer: Medicaid Other | Attending: Emergency Medicine | Admitting: Emergency Medicine

## 2015-10-02 DIAGNOSIS — Z87442 Personal history of urinary calculi: Secondary | ICD-10-CM | POA: Insufficient documentation

## 2015-10-02 DIAGNOSIS — E669 Obesity, unspecified: Secondary | ICD-10-CM | POA: Insufficient documentation

## 2015-10-02 DIAGNOSIS — K219 Gastro-esophageal reflux disease without esophagitis: Secondary | ICD-10-CM | POA: Diagnosis not present

## 2015-10-02 DIAGNOSIS — Z8619 Personal history of other infectious and parasitic diseases: Secondary | ICD-10-CM | POA: Insufficient documentation

## 2015-10-02 DIAGNOSIS — Z8739 Personal history of other diseases of the musculoskeletal system and connective tissue: Secondary | ICD-10-CM | POA: Diagnosis not present

## 2015-10-02 DIAGNOSIS — H540 Blindness, both eyes: Secondary | ICD-10-CM | POA: Insufficient documentation

## 2015-10-02 DIAGNOSIS — Z8669 Personal history of other diseases of the nervous system and sense organs: Secondary | ICD-10-CM | POA: Insufficient documentation

## 2015-10-02 DIAGNOSIS — Z79899 Other long term (current) drug therapy: Secondary | ICD-10-CM | POA: Diagnosis not present

## 2015-10-02 DIAGNOSIS — R51 Headache: Secondary | ICD-10-CM | POA: Diagnosis not present

## 2015-10-02 DIAGNOSIS — I959 Hypotension, unspecified: Secondary | ICD-10-CM | POA: Diagnosis not present

## 2015-10-02 DIAGNOSIS — R531 Weakness: Secondary | ICD-10-CM

## 2015-10-02 DIAGNOSIS — R519 Headache, unspecified: Secondary | ICD-10-CM

## 2015-10-02 MED ORDER — SODIUM CHLORIDE 0.9 % IV BOLUS (SEPSIS)
1000.0000 mL | Freq: Once | INTRAVENOUS | Status: AC
  Administered 2015-10-03: 1000 mL via INTRAVENOUS

## 2015-10-02 NOTE — ED Notes (Signed)
Pt c/o labile blood pressures, weakness, and headaches for the past week. This is the patients 3rd visit this week for the same. Pt is alert and oriented. Family at bedside with pt.

## 2015-10-02 NOTE — ED Provider Notes (Signed)
CSN: [id number]     Arrival date & time 10/02/15  2243 History   First MD Initiated Contact with Patient 10/02/15 2309     Chief Complaint  Patient presents with  . Hypertension   HPI   Patient daughter uses translator  49 year old female presents today with complaints of low blood pressure, weakness and posterior headache. Patient reports the symptoms initially started when she was coming from [place] on the airplane with the low blood pressure that was improved with oral rehydration( patient was fasting prior to this) She was evaluated in the ED diagnosed with orthostatic hypotension and dehydration, given IV fluids with good response. He was discharged home at that time. 2 days later she returned to the ED with complaints of nausea, headache, vomiting with near syncopal episode liable blood pressures and slurred speech that had resolved. She received a CT scan here in the ED with basic labs showing no acute abnormality. She was discharged home with instructions follow-up with neurology for further evaluation and management. They report that they attempted contacting neurology but "no one answered the phone". After further fashion the report that they got an answering was seen and left a message that still had not been returned. Today they they note a return of the weakness while sitting and talking with friends, a low blood pressure in the "80s" and intermittent headaches over the last week. She reports today's headache is posterior, with tightness in the right lateral soft tissues of the neck. She denies any fever, chills, nausea, vomiting any focal neurological deficits, neck stiffness, abdominal pain, lower extremity swelling or edema. Patient reports the episodes of low blood pressures not associated with acute chest pain, palpitations, nausea, vomiting, diaphoresis, sitting or standing. She does report she is having intermittent anterior base chest pain, she has been seen numerous times in the past  with similar presentations describes it as pressure/burning( discontinued using proton pump inhibitor over the last 2 days) She notes that she has been eating and drinking appropriately at home without difficulty today.    Past Medical History  Diagnosis Date  . Abnormal finding in CSF   . Low blood pressure   . Blindness - both eyes   . GERD (gastroesophageal reflux disease)   . Obesity   . Pseudomembranous colitis   . Pseudotumor cerebri   . Kidney stones   . Arthritis   . Hypothyroidism   . Helicobacter pylori gastritis 11/20/2014   Past Surgical History  Procedure Laterality Date  . Tonsillectomy     Family History  Problem Relation Age of Onset  . Diabetes Brother   . Heart disease Paternal Grandfather   . Kidney Stones Maternal Uncle   . Diabetes Maternal Grandmother   . Diabetes Sister    Social History  Substance Use Topics  . Smoking status: Never Smoker   . Smokeless tobacco: Never Used  . Alcohol Use: No   OB History    No data available     Review of Systems  All other systems reviewed and are negative.  Allergies  Review of patient's allergies indicates no known allergies.  Home Medications   Prior to Admission medications   Medication Sig Start Date End Date Taking? Authorizing Provider  albuterol (PROAIR HFA) 108 (90 BASE) MCG/ACT inhaler Inhale 2 puffs into the lungs 2 (two) times daily.    Historical Provider, MD  ibuprofen (ADVIL,MOTRIN) 200 MG tablet Take 200 mg by mouth every 6 (six) hours as needed for headache or  moderate pain.    Historical Provider, MD  nitroGLYCERIN (NITROSTAT) 0.4 MG SL tablet Place 1 tablet (0.4 mg total) under the tongue every 5 (five) minutes as needed for chest pain. 08/16/14   Renae Fickle, MD  omeprazole (PRILOSEC) 40 MG capsule Take 1 capsule (40 mg total) by mouth daily. 04/17/15   Gerhard Munch, MD  ondansetron (ZOFRAN ODT) 4 MG disintegrating tablet Take 1 tablet (4 mg total) by mouth every 8 (eight) hours  as needed for nausea or vomiting. Patient not taking: Reported on 09/30/2015 04/17/15   Gerhard Munch, MD  ondansetron (ZOFRAN) 4 MG tablet Take 1 tablet (4 mg total) by mouth every 6 (six) hours. 09/30/15   Roxy Horseman, PA-C   BP 128/65 mmHg  Pulse 75  Temp(Src) 98.2 F (36.8 C) (Oral)  Resp 18  Ht  (1.702 m)  Wt 127.914 kg  BMI 44.16 kg/m2  SpO2 100%  LMP 08/17/2015   Physical Exam  Constitutional: She is oriented to person, place, and time. She appears well-developed and well-nourished.  HENT:  Head: Normocephalic and atraumatic.  Eyes: Conjunctivae are normal. Pupils are equal, round, and reactive to light. Right eye exhibits no discharge. Left eye exhibits no discharge. No scleral icterus.  Neck: Normal range of motion. Neck supple. No JVD present. No tracheal deviation present.  Supple with full active range of motion  Cardiovascular: Normal rate, regular rhythm, normal heart sounds and intact distal pulses.  Exam reveals no gallop and no friction rub.   No murmur heard. Pulmonary/Chest: Effort normal and breath sounds normal. No stridor. No respiratory distress. She has no wheezes. She has no rales. She exhibits no tenderness.  Abdominal: Soft. She exhibits no distension and no mass. There is no tenderness. There is no rebound and no guarding.  Musculoskeletal: Normal range of motion. She exhibits no edema or tenderness.  Minor tenderness to the posterior scalp, no signs of trauma, minor tenderness to palpation of the right trapezius  Neurological: She is alert and oriented to person, place, and time. She has normal strength. No cranial nerve deficit or sensory deficit. Coordination normal. GCS eye subscore is 4. GCS verbal subscore is 5. GCS motor subscore is 6.  Reflex Scores:      Patellar reflexes are 2+ on the right side and 3+ on the left side. Patient is neurologically intact, she is blind.   Skin: Skin is warm and dry. No rash noted. No erythema. No pallor.   Psychiatric: She has a normal mood and affect. Her behavior is normal. Judgment and thought content normal.  Nursing note and vitals reviewed.     ED Course  Procedures (including critical care time) Labs Review Labs Reviewed  BASIC METABOLIC PANEL - Abnormal; Notable for the following:    Glucose, Bld 108 (*)    Calcium 8.8 (*)    All other components within normal limits  CBC WITH DIFFERENTIAL/PLATELET  Rosezena Sensor, ED    Imaging Review Dg Chest 2 View  10/03/2015  CLINICAL DATA:  Acute onset of generalized weakness and headaches. Fluctuating blood pressure. Right-sided chest pain. Subsequent encounter. EXAM: CHEST  2 VIEW COMPARISON:  Chest radiograph performed 09/28/2015 FINDINGS: The lungs are well-aerated and clear. There is no evidence of focal opacification, pleural effusion or pneumothorax. The heart is borderline enlarged. No acute osseous abnormalities are seen. IMPRESSION: Borderline cardiomegaly.  Lungs remain grossly clear. Electronically Signed   By: Roanna Raider M.D.   On: 10/03/2015 00:18   I have  personally reviewed and evaluated these images and lab results as part of my medical decision-making.   EKG Interpretation   Date/Time:  Saturday October 03 2015 00:04:07 EST Ventricular Rate:  76 PR Interval:  153 QRS Duration: 109 QT Interval:  387 QTC Calculation: 435 R Axis:   48 Text Interpretation:  Sinus rhythm Low voltage, precordial leads When  compared with ECG of 09/28/2015, No significant change was found Confirmed  by [facility]  MD, DAVID ([id number]) on 10/03/2015 12:13:29 AM      MDM   Final diagnoses:  Weakness  Acute nonintractable headache, unspecified headache type  Hypotension, unspecified hypotension type    Labs: I-STAT troponin, CBC, BMP  Imaging: DG chest 2 view  Consults:  Therapeutics: Saline  Discharge Meds:   Assessment/Plan: 49 year old female presents today with recurring low blood pressure, weakness, headaches. Patient  has been seen twice in the last week for similar presentations receiving basic labs, CT scan showing no acute abnormalities. Patient's headache is posterior, she has no focal neurological deficits,neck stiffness, or any other headache red flags.  Patient has chest pain similar to previous, likely due to GERD that she has discontinued using her proton pump inhibitors. Abdominal/chest pain had completely resolved at the time of my reevaluation, along with improvement in the neck and head pain. Patient reports significant improvement after normal saline here in the ED.  Uncertain etiology to patient's reported low blood pressures that she has remained normotensive here in the ED with no fever, tachycardia, or any other abnormal vital sign. No significant findings on diagnostic evaluation here in the ED that would require further evaluation or management here. Patient will be instructed to continue follow-up with neurology, follow up with cardiology and follow up with her primary care as previously scheduled for Monday morning.  She was given strict return precautions, verbalized understanding and agreement for today's plan and had no further questions or concerns at time of discharge.         Eyvonne Mechanic, PA-C 10/03/15 0123  Dione Booze, MD 10/03/15 [phone]

## 2015-10-03 ENCOUNTER — Emergency Department (HOSPITAL_COMMUNITY): Payer: Medicaid Other

## 2015-10-03 LAB — I-STAT TROPONIN, ED: Troponin i, poc: 0 ng/mL (ref 0.00–0.08)

## 2015-10-03 LAB — CBC WITH DIFFERENTIAL/PLATELET
Basophils Absolute: 0 10*3/uL (ref 0.0–0.1)
Basophils Relative: 0 %
Eosinophils Absolute: 0.6 10*3/uL (ref 0.0–0.7)
Eosinophils Relative: 10 %
HCT: 38.8 % (ref 36.0–46.0)
Hemoglobin: 12.9 g/dL (ref 12.0–15.0)
Lymphocytes Relative: 36 %
Lymphs Abs: 2.2 10*3/uL (ref 0.7–4.0)
MCH: 27.9 pg (ref 26.0–34.0)
MCHC: 33.2 g/dL (ref 30.0–36.0)
MCV: 83.8 fL (ref 78.0–100.0)
Monocytes Absolute: 0.6 10*3/uL (ref 0.1–1.0)
Monocytes Relative: 9 %
Neutro Abs: 2.8 10*3/uL (ref 1.7–7.7)
Neutrophils Relative %: 45 %
Platelets: 237 10*3/uL (ref 150–400)
RBC: 4.63 MIL/uL (ref 3.87–5.11)
RDW: 13.9 % (ref 11.5–15.5)
WBC: 6.1 10*3/uL (ref 4.0–10.5)

## 2015-10-03 LAB — BASIC METABOLIC PANEL
Anion gap: 7 (ref 5–15)
BUN: 12 mg/dL (ref 6–20)
CO2: 27 mmol/L (ref 22–32)
Calcium: 8.8 mg/dL — ABNORMAL LOW (ref 8.9–10.3)
Chloride: 105 mmol/L (ref 101–111)
Creatinine, Ser: 0.63 mg/dL (ref 0.44–1.00)
GFR calc Af Amer: >60 mL/min (ref >60)
GFR calc non Af Amer: >60 mL/min (ref >60)
Glucose, Bld: 108 mg/dL — ABNORMAL HIGH (ref 65–99)
Potassium: 4.1 mmol/L (ref 3.5–5.1)
Sodium: 139 mmol/L (ref 135–145)

## 2015-10-03 NOTE — ED Notes (Signed)
Went into room to get patient to ambulate to the restroom, after having to wake the patient up the patient states that she is going to be d/c'd home. Waiting for d/c orders at this time.

## 2015-10-03 NOTE — Discharge Instructions (Signed)
Fatigue °Fatigue is feeling tired all of the time, a lack of energy, or a lack of motivation. Occasional or mild fatigue is often a normal response to activity or life in general. However, long-lasting (chronic) or extreme fatigue may indicate an underlying medical condition. °HOME CARE INSTRUCTIONS  °Watch your fatigue for any changes. The following actions may help to lessen any discomfort you are feeling: °· Talk to your health care provider about how much sleep you need each night. Try to get the required amount every night. °· Take medicines only as directed by your health care provider. °· Eat a healthy and nutritious diet. Ask your health care provider if you need help changing your diet. °· Drink enough fluid to keep your urine clear or pale yellow. °· Practice ways of relaxing, such as yoga, meditation, massage therapy, or acupuncture. °· Exercise regularly.   °· Change situations that cause you stress. Try to keep your work and personal routine reasonable. °· Do not abuse illegal drugs. °· Limit alcohol intake to no more than 1 drink per day for nonpregnant women and 2 drinks per day for men. One drink equals 12 ounces of beer, 5 ounces of wine, or 1½ ounces of hard liquor. °· Take a multivitamin, if directed by your health care provider. °SEEK MEDICAL CARE IF:  °· Your fatigue does not get better. °· You have a fever.   °· You have unintentional weight loss or gain. °· You have headaches.   °· You have difficulty:   °¨ Falling asleep. °¨ Sleeping throughout the night. °· You feel angry, guilty, anxious, or sad.    °· You are unable to have a bowel movement (constipation).   °· You skin is dry.    °· Your legs or another part of your body is swollen.   °SEEK IMMEDIATE MEDICAL CARE IF:  °· You feel confused.   °· Your vision is blurry. °· You feel faint or pass out.   °· You have a severe headache.   °· You have severe abdominal, pelvic, or back pain.   °· You have chest pain, shortness of breath, or an  irregular or fast heartbeat.   °· You are unable to urinate or you urinate less than normal.   °· You develop abnormal bleeding, such as bleeding from the rectum, vagina, nose, lungs, or nipples. °· You vomit blood.    °· You have thoughts about harming yourself or committing suicide.   °· You are worried that you might harm someone else.   °  °This information is not intended to replace advice given to you by your health care provider. Make sure you discuss any questions you have with your health care provider. °  °Document Released: 08/07/2007 Document Revised: 10/31/2014 Document Reviewed: 02/11/2014 °Elsevier Interactive Patient Education ©2016 [employer] ° °Please read attached information. If you experience any new or worsening signs or symptoms please return to the emergency room for evaluation. Please follow-up with your primary care provider or specialist as discussed. Please use medication prescribed only as directed and discontinue taking if you have any concerning signs or symptoms.  °

## 2015-10-07 ENCOUNTER — Encounter: Payer: Self-pay | Admitting: *Deleted

## 2015-10-07 ENCOUNTER — Ambulatory Visit (INDEPENDENT_AMBULATORY_CARE_PROVIDER_SITE_OTHER): Payer: Medicaid Other | Admitting: Neurology

## 2015-10-07 ENCOUNTER — Encounter: Payer: Self-pay | Admitting: Neurology

## 2015-10-07 VITALS — BP 116/74 | HR 74 | Ht 67.0 in | Wt 287.2 lb

## 2015-10-07 DIAGNOSIS — R29898 Other symptoms and signs involving the musculoskeletal system: Secondary | ICD-10-CM

## 2015-10-07 DIAGNOSIS — R202 Paresthesia of skin: Secondary | ICD-10-CM | POA: Diagnosis not present

## 2015-10-07 DIAGNOSIS — G932 Benign intracranial hypertension: Secondary | ICD-10-CM | POA: Diagnosis not present

## 2015-10-07 DIAGNOSIS — H53133 Sudden visual loss, bilateral: Secondary | ICD-10-CM

## 2015-10-07 DIAGNOSIS — G629 Polyneuropathy, unspecified: Secondary | ICD-10-CM | POA: Diagnosis not present

## 2015-10-07 NOTE — Progress Notes (Signed)
[facility]    Provider:  Dr Lucia Gaskins Referring Provider: Rometta Emery, MD Primary Care Physician:  Lonia Blood, MD  CC:  Headaches, pseudotumor Cerebri  HPI:  Janice Hanson is a 49 y.o. female here as a referral from Dr. Mikeal Hawthorne for pseudotumor cerebri. She has had LPs with opening pressure of 26 in July 2015 and opening pressure of 19.5 in June of 2016. Her daughter is an interpreter and they decline any cone interpreters and sign a release form. Husband also in the room and provides information. IIH started in 1994 with severe headaches, vision changes, then complete blindness in both eyes. She has been treated with different steroid injections. She was treated with diamox. She was on diamox for 2 months and then stopped. She stopped the Diamox on her own because it was making her urinate. She stopped the diamox in 2002 and has not been treated since then. She was seen years ago by Dr. Lissa Morales in the old building for at least 5 years. Vision never improved. She can see some light in the left eye. Little vision in the right eye. Now she gets yearly LPs to check the opening pressure. She has been to the emergency room three times in the last few weeks for severe hypotension, slurred speech, left-sided weakness. She is having headaches in the back of her head. Like someone is banging on her head. Like a buzzing the right side of the head twice. She snores at night. She has gained weight. She is excessively tired during the day, very sleepy. She wakes up with morning headaches. Her feet has needles in the feet. Her toes are separated. However her Epworth sleepiness scale is only a 1. She reports tingling in the feet. No other focal neurologic deficits.  Reviewed notes, labs and imaging from outside physicians, which showed:  CLINICAL DATA: Headache and fatigue ; nausea and vomiting. Left-sided numbness and slurred speech  EXAM: CT HEAD WITHOUT  CONTRAST  TECHNIQUE: Contiguous axial images were obtained from the base of the skull through the vertex without intravenous contrast.  COMPARISON: April 17, 2015  CT of the head and 10/13/2015 (personally reviewed images and agree with the following): The ventricles are normal in size and configuration. There is no intracranial mass hemorrhage, extra-axial fluid collection, or midline shift. Gray-white compartments are normal. There is no acute infarct evident. The bony calvarium appears intact. The mastoid air cells are clear. Visualized orbits appear normal.  IMPRESSION: Study within normal limits and stable.  Review of Systems: Patient complains of symptoms per HPI as well as the following symptoms: swelling in the legs, .feeling hot, feeling cold, joint pain Pertinent negatives per HPI. All others negative.   Social History   Social History  . Marital Status: Single    Spouse Name: N/A  . Number of Children: 4  . Years of Education: N/A   Occupational History  . na    Social History Main Topics  . Smoking status: Never Smoker   . Smokeless tobacco: Never Used  . Alcohol Use: No  . Drug Use: No  . Sexual Activity: Not on file   Other Topics Concern  . Not on file   Social History Narrative   Married originally from [place]   4 children 2 girls/2 boys   Caffeine use: 1-2 cups tea daily       Family History  Problem Relation Age of Onset  . Diabetes Brother   . Heart disease Paternal Grandfather   .  Kidney Stones Maternal Uncle   . Diabetes Maternal Grandmother   . Diabetes Sister     Past Medical History  Diagnosis Date  . Abnormal finding in CSF   . Low blood pressure   . Blindness - both eyes   . GERD (gastroesophageal reflux disease)   . Obesity   . Pseudomembranous colitis   . Pseudotumor cerebri   . Kidney stones   . Arthritis   . Hypothyroidism   . Helicobacter pylori gastritis 11/20/2014    Past Surgical History  Procedure Laterality  Date  . Tonsillectomy      No current outpatient prescriptions on file.   No current facility-administered medications for this visit.    Allergies as of 10/07/2015  . (No Known Allergies)    Vitals: BP 116/74 mmHg  Pulse 74  Ht 5\' 7"  (1.702 m)  Wt 287 lb 3.2 oz (130.273 kg)  BMI 44.97 kg/m2  LMP 08/17/2015 Last Weight:  Wt Readings from Last 1 Encounters:  10/07/15 287 lb 3.2 oz (130.273 kg)   Last Height:   Ht Readings from Last 1 Encounters:  10/07/15 5\' 7"  (1.702 m)    Physical exam: Exam: Gen: NAD              CV: RRR, no MRG. No Carotid Bruits. +peripheral edema, warm, nontender Eyes: Conjunctivae clear without exudates or hemorrhage  Neuro: Detailed Neurologic Exam  Speech:    Speech is normal; fluent and spontaneous with normal comprehension.  Cognition:    The patient is oriented to person, place, and time;     recent and remote memory intact;     language fluent;     normal attention, concentration,     fund of knowledge Cranial Nerves:    The pupils are equal, round, and reactive to light. Optic nerve pallor, no disk edema. Cannot see waving fingers in the periphery on the right. Can see waving fingers in all quadrants on the left except upper outer left quadrant. She can count fingers in the central fields of the left eye. Can see shadows in the central vision of the right eye. Extraocular movements are intact. Trigeminal sensation is intact and the muscles of mastication are normal. The face is symmetric. The palate elevates in the midline. Hearing intact. Voice is normal. Shoulder shrug is normal. The tongue has normal motion without fasciculations.   Coordination:    No dysmetria  Gait:    Wide based likely due to body habitus  Motor Observation:    No asymmetry, no atrophy, and no involuntary movements noted. Tone:    Normal muscle tone.    Posture:    Posture is normal. normal erect    Strength: Left hip flexion weakness otherwise  strength is V/V in the upper and lower limbs.      Sensation: intact to LT     Reflex Exam:  DTR's: Absent lowers may be technical secondary to large body habitus Toes:    The toes are downgoing bilaterally.   Clonus:    Clonus is absent.      Assessment/Plan:  49 year old female with past medical history with a history of blindness reportedly secondary to pseudotumor cerebri. She is here for evaluation of headaches. She hasn't been treated with Diamox for many years and her lumbar puncture in June of this year had an opening pressure of 19.5 which is normal.   She has not been evaluated by ophthalmology. We'll refer to Dr. Sallye Lathristopher Groat MRI of the  brain for episodes of left-sided weakness and slurred speech as well as headache. For reported use of tingling in the feet will order a serum neuropathy panel.  To prevent or relieve headaches, try the following: Cool Compress. Lie down and place a cool compress on your head.  Avoid headache triggers. If certain foods or odors seem to have triggered your migraines in the past, avoid them. A headache diary might help you identify triggers.  Include physical activity in your daily routine. Try a daily walk or other moderate aerobic exercise.  Manage stress. Find healthy ways to cope with the stressors, such as delegating tasks on your to-do list.  Practice relaxation techniques. Try deep breathing, yoga, massage and visualization.  Eat regularly. Eating regularly scheduled meals and maintaining a healthy diet might help prevent headaches. Also, drink plenty of fluids.  Follow a regular sleep schedule. Sleep deprivation might contribute to headaches Consider biofeedback. With this mind-body technique, you learn to control certain bodily functions - such as muscle tension, heart rate and blood pressure - to prevent headaches or reduce headache pain.    Proceed to emergency room if you experience new or worsening symptoms or symptoms do not  resolve, if you have new neurologic symptoms or if headache is severe, or for any concerning symptom.    Naomie Dean, MD  [facility] [address] [place], [place] [address]  Phone [phone] Fax [phone]

## 2015-10-07 NOTE — Patient Instructions (Signed)
Remember to drink plenty of fluid, eat healthy meals and do not skip any meals. Try to eat protein with a every meal and eat a healthy snack such as fruit or nuts in between meals. Try to keep a regular sleep-wake schedule and try to exercise daily, particularly in the form of walking, 20-30 minutes a day, if you can.   As far as diagnostic testing: MRi of the brain, labs  I would like to see you back in 3 months, sooner if we need to. Please call us with any interim questions, concerns, problems, updates or refill requests.   Our phone number is [phone]. We also have an after hours call service for urgent matters and there is a physician on-call for urgent questions. For any emergencies you know to call 911 or go to the nearest emergency room   

## 2015-10-10 LAB — HEAVY METALS, BLOOD
Arsenic: 5 ug/L (ref 2–23)
Lead, Blood: NOT DETECTED ug/dL (ref 0–19)
Mercury: 1.5 ug/L (ref 0.0–14.9)

## 2015-10-10 LAB — SEDIMENTATION RATE: Sed Rate: 23 mm/hr (ref 0–40)

## 2015-10-10 LAB — COMPREHENSIVE METABOLIC PANEL
ALT: 10 IU/L (ref 0–32)
AST: 10 IU/L (ref 0–40)
Albumin/Globulin Ratio: 1 — ABNORMAL LOW (ref 1.1–2.5)
Albumin: 3.6 g/dL (ref 3.5–5.5)
Alkaline Phosphatase: 98 IU/L (ref 39–117)
BUN/Creatinine Ratio: 17 (ref 9–23)
BUN: 11 mg/dL (ref 6–24)
Bilirubin Total: 0.2 mg/dL (ref 0.0–1.2)
CO2: 27 mmol/L (ref 18–29)
Calcium: 9.1 mg/dL (ref 8.7–10.2)
Chloride: 102 mmol/L (ref 96–106)
Creatinine, Ser: 0.66 mg/dL (ref 0.57–1.00)
GFR calc Af Amer: 119 mL/min/{1.73_m2} (ref >59)
GFR calc non Af Amer: 103 mL/min/{1.73_m2} (ref >59)
Globulin, Total: 3.6 g/dL (ref 1.5–4.5)
Glucose: 130 mg/dL — ABNORMAL HIGH (ref 65–99)
Potassium: 3.9 mmol/L (ref 3.5–5.2)
Sodium: 140 mmol/L (ref 134–144)
Total Protein: 7.2 g/dL (ref 6.0–8.5)

## 2015-10-10 LAB — MULTIPLE MYELOMA PANEL, SERUM
Albumin SerPl Elph-Mcnc: 2.9 g/dL (ref 2.9–4.4)
Albumin/Glob SerPl: 0.7 (ref 0.7–1.7)
Alpha 1: 0.3 g/dL (ref 0.0–0.4)
Alpha2 Glob SerPl Elph-Mcnc: 0.9 g/dL (ref 0.4–1.0)
B-Globulin SerPl Elph-Mcnc: 1.1 g/dL (ref 0.7–1.3)
Gamma Glob SerPl Elph-Mcnc: 2 g/dL — ABNORMAL HIGH (ref 0.4–1.8)
Globulin, Total: 4.3 g/dL — ABNORMAL HIGH (ref 2.2–3.9)
IgA/Immunoglobulin A, Serum: 122 mg/dL (ref 87–352)
IgG (Immunoglobin G), Serum: 2063 mg/dL — ABNORMAL HIGH (ref 700–1600)
IgM (Immunoglobulin M), Srm: 204 mg/dL (ref 26–217)

## 2015-10-10 LAB — RPR: RPR Ser Ql: NONREACTIVE

## 2015-10-10 LAB — THYROID PANEL WITH TSH
Free Thyroxine Index: 2.4 (ref 1.2–4.9)
T3 Uptake Ratio: 29 % (ref 24–39)
T4, Total: 8.3 ug/dL (ref 4.5–12.0)
TSH: 1.67 u[IU]/mL (ref 0.450–4.500)

## 2015-10-10 LAB — B12 AND FOLATE PANEL
Folate: 4.8 ng/mL (ref >3.0)
Vitamin B-12: 220 pg/mL (ref 211–946)

## 2015-10-10 LAB — C-REACTIVE PROTEIN: CRP: 11.3 mg/L — ABNORMAL HIGH (ref 0.0–4.9)

## 2015-10-10 LAB — HEMOGLOBIN A1C
Est. average glucose Bld gHb Est-mCnc: 134 mg/dL
Hgb A1c MFr Bld: 6.3 % — ABNORMAL HIGH (ref 4.8–5.6)

## 2015-10-10 LAB — HIV ANTIBODY (ROUTINE TESTING W REFLEX): HIV Screen 4th Generation wRfx: NONREACTIVE

## 2015-10-10 LAB — METHYLMALONIC ACID, SERUM: Methylmalonic Acid: 157 nmol/L (ref 0–378)

## 2015-10-10 LAB — HEPATITIS C ANTIBODY: Hep C Virus Ab: <0.1 s/co ratio (ref 0.0–0.9)

## 2015-10-10 LAB — B. BURGDORFI ANTIBODIES: Lyme IgG/IgM Ab: <0.91 {ISR} (ref 0.00–0.90)

## 2015-10-12 ENCOUNTER — Telehealth: Payer: Self-pay | Admitting: *Deleted

## 2015-10-12 NOTE — Telephone Encounter (Addendum)
Called and spoke to daughter about lab results per Dr Lucia GaskinsAhern. Ok per [employer]. Daughter verbalized understanding. Advised I will forward results to her PCP.   Faxed results to PCP. Received confirmation.

## 2015-10-12 NOTE — Telephone Encounter (Signed)
-----   Message from Anson FretAntonia B Ahern, MD sent at 10/11/2015 12:06 PM EST ----- Patient labs came back with only one that is concerning, her HgbA1c is 6.3, 6.4 is diabetic. This increase in glucose can cause or worsen the neuropathy in her feet. I recommend diet, exercise, decreasing carbs and sugars and following up with primary care for repeat test in 6 months. We can forward to primary care as well, please do that thanks.  Thanks.

## 2015-10-16 ENCOUNTER — Telehealth: Payer: Self-pay | Admitting: Neurology

## 2015-10-16 NOTE — Telephone Encounter (Signed)
Needs NMO and other labs, call patient

## 2015-10-22 ENCOUNTER — Other Ambulatory Visit: Payer: Self-pay | Admitting: Neurology

## 2015-10-22 DIAGNOSIS — H469 Unspecified optic neuritis: Secondary | ICD-10-CM

## 2015-10-22 DIAGNOSIS — G360 Neuromyelitis optica [Devic]: Secondary | ICD-10-CM

## 2015-10-22 NOTE — Telephone Encounter (Signed)
Called and relayed Dr Lucia Gaskinsahern message below to daughter, Margaretmary LombardFatima. Ok per [employer]. She verbalized understanding. She will bring mother Tuesday for lab work. Ok per Dr Lucia GaskinsAhern.

## 2015-10-22 NOTE — Telephone Encounter (Signed)
Kara MeadEmma, after talking to Dr. Dione BoozeGroat, the ophthalmologist, I think we need more lab tests and also an MRi of her cervical spine. Owuld you let them know? I ordered additional imaging as well as additional lab work. I am trying to rule out other causes of her blindness per Dr. Laruth BouchardGroat's recommendations. thanks

## 2015-10-27 NOTE — Progress Notes (Signed)
Cardiology Office Note   Date:  10/31/2015   ID:  Janice Hanson, DOB January 09, 1966, MRN [id number]  PCP:  Lonia Blood, MD  Cardiologist:   Madilyn Hook, MD   Chief Complaint  Patient presents with  . Follow-up    new eval.//post hosp//pt states she has had mild chest pain since she left the hospital, it has not been as bad  . Loss of Consciousness    pt states she was fainting in the hospital and for a few days after, accompanied by headaches.  . Edema    in bilateral upper legs      History of Present Illness: Janice Hanson is a 50 y.o. female with GERD who presents for an evaluation of chest pain.  [name] speaks Arabic and this interview is accomplished through her daughter, who serves as a Nurse, learning disability.  [name] refuses a Librarian, academic.  [name] reports one year of intermittent chest pain.  The pain is a pulling sensation that is 5 out of 10 in severity. It is substernal and does not radiate. There is no associated shortness of breath or diaphoresis, but she does endorse nausea.  He episodes occur every other day and last for 2-3 minutes at a time. She does not exert herself other than walking, but it does not worsen as she walks.  She has noted exertional shortness of breath.  The pain was worse 2 weeks ago but now is less intense. It sometimes occurs when laying down.  She has not tried taking any medications for her symptoms.  [name] presented to the ED on 12/9 with weakness, hypotension and headache.  At the time she was noted to be hypotensive and received IV fluids. She reports additional episodes of syncope since that time, but none in the last week. She fasts from sun up to sundown on Mondays and Thursdays.  She does so for religious reasons, though this is not mandatory her daughter.  Prior to passing out she notes palpitations and fatigue.  She denies any preceding chest pain or shortness of breath.  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  She was previously treated with Diamox and lumbar punctures.  Her last LP in June had an opening pressure of 19.5 (normal) and she is being cared for by Dr. Lucia Gaskins of Neurology.  She was recently referred to an Opthalmologist.      Past Medical History  Diagnosis Date  . Abnormal finding in CSF   . Low blood pressure   . Blindness - both eyes   . GERD (gastroesophageal reflux disease)   . Obesity   . Pseudomembranous colitis   . Pseudotumor cerebri   . Kidney stones   . Arthritis   . Hypothyroidism   . Helicobacter pylori gastritis 11/20/2014  . Syncope 10/31/2015    Past Surgical History  Procedure Laterality Date  . Tonsillectomy       Current Outpatient Prescriptions  Medication Sig Dispense Refill  . omeprazole (PRILOSEC) 40 MG capsule Take 1 capsule by mouth daily.  0   No current facility-administered medications for this visit.    Allergies:   Review of patient's allergies indicates no known allergies.    Social History:  The patient  reports that she has never smoked. She has never used smokeless tobacco. She reports that she does not drink alcohol or use illicit drugs.   Family History:  The patient's family history includes Diabetes in her brother, maternal grandmother,  and sister; Heart disease in her maternal grandmother and paternal grandfather; Kidney Stones in her maternal uncle.    ROS:  Please see the history of present illness.   Otherwise, review of systems are positive for none.   All other systems are reviewed and negative.    PHYSICAL EXAM: VS:  BP 124/76 mmHg  Pulse 68  Ht 5\' 7"  (1.702 m)  Wt 127.824 kg (281 lb 12.8 oz)  BMI 44.13 kg/m2  LMP 08/17/2015 , BMI Body mass index is 44.13 kg/(m^2). GENERAL:  Well appearing HEENT:  Pupils equal round and reactive, fundi not visualized, oral mucosa unremarkable NECK:  No jugular venous distention, waveform within normal limits, carotid upstroke brisk and symmetric, no bruits, no  thyromegaly LYMPHATICS:  No cervical adenopathy LUNGS:  Clear to auscultation bilaterally HEART:  RRR.  PMI not displaced or sustained,S1 and S2 within normal limits, no S3, no S4, no clicks, no rubs, no murmurs ABD:  Flat, positive bowel sounds normal in frequency in pitch, no bruits, no rebound, no guarding, no midline pulsatile mass, no hepatomegaly, no splenomegaly EXT:  2 plus pulses throughout, no edema, no cyanosis no clubbing SKIN:  No rashes no nodules NEURO:  Cranial nerves II through XII grossly intact, motor grossly intact throughout PSYCH:  Cognitively intact, oriented to person place and time   EKG:  EKG is ordered today. The ekg ordered today demonstrates sinus rhythm rate 68 bpm.   Recent Labs: 04/17/2015: B Natriuretic Peptide 16.5 10/02/2015: Hemoglobin 12.9; Platelets 237 10/07/2015: ALT 10; BUN 11; Creatinine, Ser 0.66; Potassium 3.9; Sodium 140; TSH 1.670    Lipid Panel No results found for: CHOL, TRIG, HDL, CHOLHDL, VLDL, LDLCALC, LDLDIRECT    Wt Readings from Last 3 Encounters:  10/28/15 127.824 kg (281 lb 12.8 oz)  10/07/15 130.273 kg (287 lb 3.2 oz)  10/02/15 127.914 kg (282 lb)      ASSESSMENT AND PLAN:  # Atypical chest pain:  Symptoms are very atypical and unlikely to be due to ischemia. We will obtain a Lexiscan Cardiolite she is unable to walk on a treadmill due to blindness.  Symptoms may also be related to GERD or musculoskeletal disease.  # Syncope: Likely related to fasting.  She was not orthostatic today.  She last fasted it 2 days ago.  She had an echo in 2015 that revealed a structurally normal heart with mild left atrial enlargement and no other significant abnormalities. We discussed the fact that she may need to obtain a rectal exception from fasting, as it is causing hypotension and syncope.  We also discussed alternatives such as fasting from specific foods.  She is not orthostatic today.   Current medicines are reviewed at length with  the patient today.  The patient does not have concerns regarding medicines.  The following changes have been made:  no change  Labs/ tests ordered today include:   Orders Placed This Encounter  Procedures  . Myocardial Perfusion Imaging  . EKG 12-Lead     Disposition:   FU with Jocelin Schuelke C. Duke Salviaandolph, MD, [facility] in 3 months.    This note was written with the assistance of speech recognition software.  Please excuse any transcriptional errors.  Signed, Trevyn Lumpkin C. Duke Salviaandolph, MD, [facility]  10/31/2015 10:42 AM    [facility]

## 2015-10-28 ENCOUNTER — Encounter: Payer: Self-pay | Admitting: Cardiovascular Disease

## 2015-10-28 ENCOUNTER — Ambulatory Visit (INDEPENDENT_AMBULATORY_CARE_PROVIDER_SITE_OTHER): Payer: Medicaid Other | Admitting: Cardiovascular Disease

## 2015-10-28 VITALS — BP 124/76 | HR 68 | Ht 67.0 in | Wt 281.8 lb

## 2015-10-28 DIAGNOSIS — R079 Chest pain, unspecified: Secondary | ICD-10-CM

## 2015-10-28 DIAGNOSIS — R55 Syncope and collapse: Secondary | ICD-10-CM

## 2015-10-28 NOTE — Patient Instructions (Addendum)
Medication Instructions:   Your physician recommends that you continue on your current medications as directed. Please refer to the Current Medication list given to you today.   If you need a refill on your cardiac medications before your next appointment, please call your pharmacy.  Labwork: NONE ORDER TODAY    Testing/Procedures:   Your physician has requested that you have a lexiscan myoview. For further information please visit [email or web address]. Please follow instruction sheet, as given.     Follow-Up: IN 3 MONTHS [facility]   Any Other Special Instructions Will Be Listed Below (If Applicable).

## 2015-10-29 ENCOUNTER — Other Ambulatory Visit: Payer: Self-pay | Admitting: Neurology

## 2015-10-30 ENCOUNTER — Other Ambulatory Visit: Payer: Self-pay | Admitting: Neurology

## 2015-10-31 ENCOUNTER — Encounter: Payer: Self-pay | Admitting: Cardiovascular Disease

## 2015-10-31 DIAGNOSIS — R55 Syncope and collapse: Secondary | ICD-10-CM

## 2015-10-31 HISTORY — DX: Syncope and collapse: R55

## 2015-11-03 ENCOUNTER — Other Ambulatory Visit: Payer: Medicaid Other

## 2015-11-03 ENCOUNTER — Ambulatory Visit
Admission: RE | Admit: 2015-11-03 | Discharge: 2015-11-03 | Disposition: A | Discharge status: Home / Self Care | Payer: Medicaid Other | Source: Ambulatory Visit | Attending: Neurology | Admitting: Neurology

## 2015-11-03 DIAGNOSIS — G932 Benign intracranial hypertension: Secondary | ICD-10-CM

## 2015-11-03 DIAGNOSIS — R202 Paresthesia of skin: Secondary | ICD-10-CM

## 2015-11-03 DIAGNOSIS — G629 Polyneuropathy, unspecified: Secondary | ICD-10-CM

## 2015-11-03 DIAGNOSIS — H53133 Sudden visual loss, bilateral: Secondary | ICD-10-CM

## 2015-11-03 DIAGNOSIS — R29898 Other symptoms and signs involving the musculoskeletal system: Secondary | ICD-10-CM

## 2015-11-03 MED ORDER — GADOBENATE DIMEGLUMINE 529 MG/ML IV SOLN
20.0000 mL | Freq: Once | INTRAVENOUS | Status: AC | PRN
  Administered 2015-11-03: 20 mL via INTRAVENOUS

## 2015-11-04 ENCOUNTER — Telehealth: Payer: Self-pay | Admitting: *Deleted

## 2015-11-04 ENCOUNTER — Encounter (INDEPENDENT_AMBULATORY_CARE_PROVIDER_SITE_OTHER): Payer: Self-pay

## 2015-11-04 ENCOUNTER — Encounter: Payer: Self-pay | Admitting: Neurology

## 2015-11-04 ENCOUNTER — Ambulatory Visit
Admission: RE | Admit: 2015-11-04 | Discharge: 2015-11-04 | Disposition: A | Discharge status: Home / Self Care | Payer: Medicaid Other | Source: Ambulatory Visit | Attending: Neurology | Admitting: Neurology

## 2015-11-04 ENCOUNTER — Ambulatory Visit (INDEPENDENT_AMBULATORY_CARE_PROVIDER_SITE_OTHER): Payer: Medicaid Other | Admitting: Neurology

## 2015-11-04 VITALS — BP 127/85 | HR 78 | Ht 67.0 in | Wt 281.4 lb

## 2015-11-04 DIAGNOSIS — M25561 Pain in right knee: Secondary | ICD-10-CM

## 2015-11-04 DIAGNOSIS — H468 Other optic neuritis: Secondary | ICD-10-CM

## 2015-11-04 DIAGNOSIS — G932 Benign intracranial hypertension: Secondary | ICD-10-CM

## 2015-11-04 DIAGNOSIS — H469 Unspecified optic neuritis: Secondary | ICD-10-CM

## 2015-11-04 DIAGNOSIS — H53133 Sudden visual loss, bilateral: Secondary | ICD-10-CM | POA: Diagnosis not present

## 2015-11-04 NOTE — Patient Instructions (Addendum)
Remember to drink plenty of fluid, eat healthy meals and do not skip any meals. Try to eat protein with a every meal and eat a healthy snack such as fruit or nuts in between meals. Try to keep a regular sleep-wake schedule and try to exercise daily, particularly in the form of walking, 20-30 minutes a day, if you can.   As far as diagnostic testing: XR of the right knee  I would like to see you back in 3 months, sooner if we need to. Please call us with any interim questions, concerns, problems, updates or refill requests.   Our phone number is [phone]. We also have an after hours call service for urgent matters and there is a physician on-call for urgent questions. For any emergencies you know to call 911 or go to the nearest emergency room

## 2015-11-04 NOTE — Telephone Encounter (Signed)
-----   Message from Anson FretAntonia B Ahern, MD sent at 11/04/2015  4:58 PM EST ----- The knee looked fine, no abnormality seen. [facility] thanks

## 2015-11-04 NOTE — Progress Notes (Signed)
[facility]    Provider:  Dr Lucia Gaskins Referring Provider: Rometta Emery, MD Primary Care Physician:  Lonia Blood, MD  CC:  IIH  Interval Update: She has a weird feeling in the back of her right knee. It is very painful. Her toes are "separating". No tingling or burning in the feet. But the toes are "separating". She has pain on the top of the feet when she presses on it. No numbness, no burning, no tingling. She had tingling last time but it has gone away. Pain in the knee started 1 month - 3 weeks ago.  She has had 3 headaches in the last month. They last a day or two.   HPI: Janice Hanson is a 50 y.o. female here as a referral from Dr. Mikeal Hawthorne for pseudotumor cerebri. She has had LPs with opening pressure of 26 in July 2015 and opening pressure of 19.5 in June of 2016. Her daughter is an interpreter and they decline any cone interpreters and sign a release form. Husband also in the room and provides information. IIH started in 1994 with severe headaches, vision changes, then complete blindness in both eyes. She has been treated with different steroid injections. She was treated with diamox. She was on diamox for 2 months and then stopped. She stopped the Diamox on her own because it was making her urinate. She stopped the diamox in 2002 and has not been treated since then. She was seen years ago by Dr. Lissa Morales in the old building for at least 5 years. Vision never improved. She can see some light in the left eye. Little vision in the right eye. Now she gets yearly LPs to check the opening pressure. She has been to the emergency room three times in the last few weeks for severe hypotension, slurred speech, left-sided weakness. She is having headaches in the back of her head. Like someone is banging on her head. Like a buzzing the right side of the head twice. She snores at night. She has gained weight. She is excessively tired during the day, very sleepy. She wakes up with morning  headaches. Her feet has needles in the feet. Her toes are separated. However her Epworth sleepiness scale is only a 1. She reports tingling in the feet. No other focal neurologic deficits.  Reviewed notes, labs and imaging from outside physicians, which showed:  CLINICAL DATA: Headache and fatigue ; nausea and vomiting. Left-sided numbness and slurred speech  EXAM: CT HEAD WITHOUT CONTRAST  TECHNIQUE: Contiguous axial images were obtained from the base of the skull through the vertex without intravenous contrast.  COMPARISON: April 17, 2015  CT of the head and 10/13/2015 (personally reviewed images and agree with the following): The ventricles are normal in size and configuration. There is no intracranial mass hemorrhage, extra-axial fluid collection, or midline shift. Gray-white compartments are normal. There is no acute infarct evident. The bony calvarium appears intact. The mastoid air cells are clear. Visualized orbits appear normal.  IMPRESSION: Study within normal limits and stable.  Review of Systems: Patient complains of symptoms per HPI as well as the following symptoms: swelling in the legs, .feeling hot, feeling cold, joint pain Pertinent negatives per HPI. All others negative.   Social History   Social History  . Marital Status: Single    Spouse Name: N/A  . Number of Children: 4  . Years of Education: N/A   Occupational History  . na    Social History Main Topics  . Smoking  status: Never Smoker   . Smokeless tobacco: Never Used  . Alcohol Use: No  . Drug Use: No  . Sexual Activity: Not on file   Other Topics Concern  . Not on file   Social History Narrative   Married originally from [place]   4 children 2 girls/2 boys   Caffeine use: 1-2 cups tea daily       Family History  Problem Relation Age of Onset  . Diabetes Brother   . Heart disease Paternal Grandfather   . Kidney Stones Maternal Uncle   . Diabetes Maternal Grandmother   . Heart  disease Maternal Grandmother   . Diabetes Sister     Past Medical History  Diagnosis Date  . Abnormal finding in CSF   . Low blood pressure   . Blindness - both eyes   . GERD (gastroesophageal reflux disease)   . Obesity   . Pseudomembranous colitis   . Pseudotumor cerebri   . Kidney stones   . Arthritis   . Hypothyroidism   . Helicobacter pylori gastritis 11/20/2014  . Syncope 10/31/2015    Past Surgical History  Procedure Laterality Date  . Tonsillectomy      Current Outpatient Prescriptions  Medication Sig Dispense Refill  . omeprazole (PRILOSEC) 40 MG capsule Take 1 capsule by mouth daily.  0   No current facility-administered medications for this visit.    Allergies as of 11/04/2015  . (No Known Allergies)    Vitals: BP 127/85 mmHg  Pulse 78  Ht 5\' 7"  (1.702 m)  Wt 281 lb 6.4 oz (127.642 kg)  BMI 44.06 kg/m2 Last Weight:  Wt Readings from Last 1 Encounters:  11/04/15 281 lb 6.4 oz (127.642 kg)   Last Height:   Ht Readings from Last 1 Encounters:  11/04/15 5\' 7"  (1.702 m)   Cranial Nerves:  The pupils are equal, round, and reactive to light. Optic nerve pallor, no disk edema. Cannot see waving fingers in the periphery on the right. Can see waving fingers in all quadrants on the left except upper outer left quadrant. She can count fingers in the central fields of the left eye. Can see shadows in the central vision of the right eye. Extraocular movements are intact. Trigeminal sensation is intact and the muscles of mastication are normal. The face is symmetric. The palate elevates in the midline. Hearing intact. Voice is normal. Shoulder shrug is normal. The tongue has normal motion without fasciculations.   Coordination:  No dysmetria  Gait:  Wide based likely due to body habitus  Motor Observation:  No asymmetry, no atrophy, and no involuntary movements noted. Tone:  Normal muscle tone.   Sensory: intact to pin prick, temperature, vibration  and proprioception distally. No sensory abnormalities on exam.   Assessment/Plan:  Assessment/Plan: 50 year old female with past medical history with a history of blindness reportedly secondary to pseudotumor cerebri. She is here for evaluation of headaches. She hasn't been treated with Diamox for many years and her lumbar puncture in June of this year had an opening pressure of 19.5 which is normal.   She has been evaluated by ophthalmology. Dr. Dione Booze recommends NMO lab testing, have ordered MRI of the brain for episodes of left-sided weakness and slurred speech as well as headache: pending Knee pain: XR Podiatrist for foot pain, sensory exam normal, no symptoms of peripheral neuropathy Daily B12 in a vitamin, B12 was 220 low normal Discussed HgbA1c 6.3, they followed with primary care.   To prevent or  relieve headaches, try the following:  Cool Compress. Lie down and place a cool compress on your head.   Avoid headache triggers. If certain foods or odors seem to have triggered your migraines in the past, avoid them. A headache diary might help you identify triggers.   Include physical activity in your daily routine. Try a daily walk or other moderate aerobic exercise.   Manage stress. Find healthy ways to cope with the stressors, such as delegating tasks on your to-do list.   Practice relaxation techniques. Try deep breathing, yoga, massage and visualization.   Eat regularly. Eating regularly scheduled meals and maintaining a healthy diet might help prevent headaches. Also, drink plenty of fluids.   Follow a regular sleep schedule. Sleep deprivation might contribute to headaches  Consider biofeedback. With this mind-body technique, you learn to control certain bodily functions - such as muscle tension, heart rate and blood pressure - to prevent headaches or reduce headache pain.   Proceed to emergency room if you experience new or worsening symptoms or symptoms do not resolve,  if you have new neurologic symptoms or if headache is severe, or for any concerning symptom.   Naomie DeanAntonia Tadd Holtmeyer, MD  [facility] [address] [place], [place] [id number]  Phone [phone] Fax [phone]  A total of 30 minutes was spent face-to-face with this patient. Over half this time was spent on counseling patient on the IIH and knee pain diagnosis and different diagnostic and therapeutic options available.

## 2015-11-04 NOTE — Telephone Encounter (Signed)
Spoke to [place] Co[place]ima (daughter- ok per [facility]) xray knee looks ok per Dr Lucia GaskinsAhern. She should f/u w/ PCP about it. She verbalized understanding and will let mother know.

## 2015-11-05 ENCOUNTER — Telehealth (HOSPITAL_COMMUNITY): Payer: Self-pay

## 2015-11-05 NOTE — Telephone Encounter (Signed)
When I attempted the 3rd time the person answered the phone again and said the pt and wife and daughter are not home and would not be back until later. I will attempt again tomorrow.

## 2015-11-06 ENCOUNTER — Telehealth (HOSPITAL_COMMUNITY): Payer: Self-pay

## 2015-11-06 LAB — RPR: RPR Ser Ql: NONREACTIVE

## 2015-11-06 LAB — HIV ANTIBODY (ROUTINE TESTING W REFLEX): HIV Screen 4th Generation wRfx: NONREACTIVE

## 2015-11-06 LAB — B12 AND FOLATE PANEL
Folate: 7.6 ng/mL (ref >3.0)
Vitamin B-12: 218 pg/mL (ref 211–946)

## 2015-11-06 LAB — ANA W/REFLEX: Anti Nuclear Antibody(ANA): NEGATIVE

## 2015-11-06 LAB — ANGIOTENSIN CONVERTING ENZYME: Angio Convert Enzyme: 63 U/L (ref 14–82)

## 2015-11-06 LAB — SEDIMENTATION RATE: Sed Rate: 20 mm/hr (ref 0–40)

## 2015-11-06 LAB — VITAMIN B1: Thiamine: 127 nmol/L (ref 66.5–200.0)

## 2015-11-06 LAB — NEUROMYELITIS OPTICA AUTOAB, IGG: NMO IgG Autoantibodies: <1.5 U/mL (ref 0.0–3.0)

## 2015-11-06 NOTE — Telephone Encounter (Signed)
Encounter complete. 

## 2015-11-10 ENCOUNTER — Ambulatory Visit (HOSPITAL_COMMUNITY)
Admission: RE | Admit: 2015-11-10 | Discharge: 2015-11-10 | Disposition: A | Discharge status: Home / Self Care | Payer: Medicaid Other | Source: Ambulatory Visit | Attending: Cardiovascular Disease | Admitting: Cardiovascular Disease

## 2015-11-10 DIAGNOSIS — H540 Blindness, both eyes: Secondary | ICD-10-CM | POA: Insufficient documentation

## 2015-11-10 DIAGNOSIS — R55 Syncope and collapse: Secondary | ICD-10-CM | POA: Diagnosis not present

## 2015-11-10 DIAGNOSIS — R0609 Other forms of dyspnea: Secondary | ICD-10-CM | POA: Diagnosis not present

## 2015-11-10 DIAGNOSIS — R0602 Shortness of breath: Secondary | ICD-10-CM | POA: Insufficient documentation

## 2015-11-10 DIAGNOSIS — E669 Obesity, unspecified: Secondary | ICD-10-CM | POA: Diagnosis not present

## 2015-11-10 DIAGNOSIS — R5383 Other fatigue: Secondary | ICD-10-CM | POA: Diagnosis not present

## 2015-11-10 DIAGNOSIS — Z6841 Body Mass Index (BMI) 40.0 and over, adult: Secondary | ICD-10-CM | POA: Diagnosis not present

## 2015-11-10 DIAGNOSIS — R079 Chest pain, unspecified: Secondary | ICD-10-CM | POA: Diagnosis not present

## 2015-11-10 DIAGNOSIS — R002 Palpitations: Secondary | ICD-10-CM | POA: Insufficient documentation

## 2015-11-10 DIAGNOSIS — Z8249 Family history of ischemic heart disease and other diseases of the circulatory system: Secondary | ICD-10-CM | POA: Diagnosis not present

## 2015-11-10 IMAGING — NM NM MISC PROCEDURE
6 series · 36 of 36 positions shown · non-contrast
Comparison: none

[Series 1: wbr stress-gsp · 6.40mm/px · 6 of 510 frames shown]
[frame 43/510]
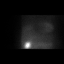
[frame 128/510]
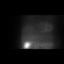
[frame 213/510]
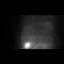
[frame 298/510]
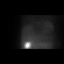
[frame 383/510]
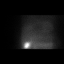
[frame 468/510]
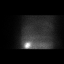

[Series 1: wbr_s-proj_st wbr stress-gsp · 6.40mm/px · 6 of 512 frames shown]
[frame 43/512]
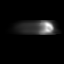
[frame 128/512]
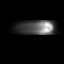
[frame 214/512]
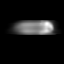
[frame 299/512]
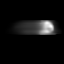
[frame 384/512]
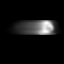
[frame 470/512]
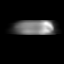

[Series 2: wbr stress-sum-em · 6.40mm/px · 6 of 64 frames shown]
[frame 6/64]
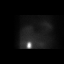
[frame 16/64]
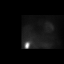
[frame 27/64]
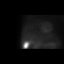
[frame 38/64]
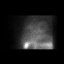
[frame 48/64]
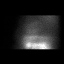
[frame 59/64]
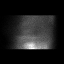

[Series 2: wbr_s-proj_st wbr stress-sum-em · 6.40mm/px · 6 of 64 frames shown]
[frame 6/64]
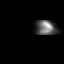
[frame 16/64]
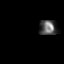
[frame 27/64]
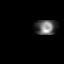
[frame 38/64]
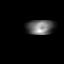
[frame 48/64]
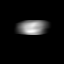
[frame 59/64]
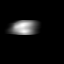

[Series 3: wbr rest · 6.40mm/px · 6 of 64 frames shown]
[frame 6/64]
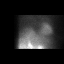
[frame 16/64]
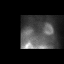
[frame 27/64]
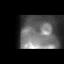
[frame 38/64]
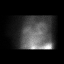
[frame 48/64]
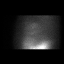
[frame 59/64]
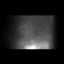

[Series 3: wbr_r-proj_st wbr rest · 6.40mm/px · 6 of 64 frames shown]
[frame 6/64]
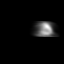
[frame 16/64]
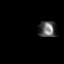
[frame 27/64]
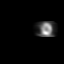
[frame 38/64]
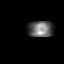
[frame 48/64]
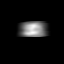
[frame 59/64]
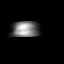

[36 of 36 positions shown; findings below may reference images not displayed]

Canned report from images found in remote index.

Refer to host system for actual result text.

## 2015-11-10 MED ORDER — AMINOPHYLLINE 25 MG/ML IV SOLN
125.0000 mg | Freq: Once | INTRAVENOUS | Status: AC
  Administered 2015-11-10: 125 mg via INTRAVENOUS

## 2015-11-10 MED ORDER — REGADENOSON 0.4 MG/5ML IV SOLN
0.4000 mg | Freq: Once | INTRAVENOUS | Status: AC
  Administered 2015-11-10: 0.4 mg via INTRAVENOUS

## 2015-11-10 MED ORDER — TECHNETIUM TC 99M SESTAMIBI GENERIC - CARDIOLITE
29.4000 | Freq: Once | INTRAVENOUS | Status: AC | PRN
  Administered 2015-11-10: 29 via INTRAVENOUS

## 2015-11-11 ENCOUNTER — Ambulatory Visit (HOSPITAL_COMMUNITY)
Admission: RE | Admit: 2015-11-11 | Discharge: 2015-11-11 | Disposition: A | Discharge status: Home / Self Care | Payer: Medicaid Other | Source: Ambulatory Visit | Attending: Cardiovascular Disease | Admitting: Cardiovascular Disease

## 2015-11-11 LAB — MYOCARDIAL PERFUSION IMAGING
LV dias vol: 111 mL
LV sys vol: 51 mL
Peak HR: 122 {beats}/min
Rest HR: 74 {beats}/min
SDS: 10
SRS: 0
SSS: 10
TID: 1.24

## 2015-11-11 MED ORDER — TECHNETIUM TC 99M SESTAMIBI GENERIC - CARDIOLITE
31.1000 | Freq: Once | INTRAVENOUS | Status: AC | PRN
  Administered 2015-11-11: 31.1 via INTRAVENOUS

## 2015-11-13 ENCOUNTER — Telehealth: Payer: Self-pay | Admitting: *Deleted

## 2015-11-13 NOTE — Telephone Encounter (Signed)
-----   Message from Chilton Si, MD sent at 11/12/2015  6:00 PM EST ----- Normal stress test.

## 2015-11-13 NOTE — Telephone Encounter (Signed)
Left message to call back In regards to test results

## 2015-11-15 ENCOUNTER — Telehealth: Payer: Self-pay | Admitting: Neurology

## 2015-11-15 NOTE — Telephone Encounter (Signed)
Spoke with husband. Discussed normal labs including NMO antibodies and unremarkable MRI except for partially empty sella which is non specific however she does have history of IIH. There does not appear to be increased fluid around the optic nerves.  She denies any headaches, no changes in vision. They have follow up in March at which time we can perform an LP with opening pressure . If headaches reoccur they are to call me asap.   IMPRESSION:  Equivocal MRI brain (with and without) demonstrating: 1. Trivial periventricular and subcortical foci of non-specific gliosis in the right frontal region. 2. Enlarged partially empty sella. 3. No acute findings.

## 2015-11-18 NOTE — Telephone Encounter (Signed)
LEFT MESSAGE TO CALL BACK

## 2015-11-19 ENCOUNTER — Encounter: Payer: Self-pay | Admitting: *Deleted

## 2015-11-19 NOTE — Telephone Encounter (Signed)
Letter mailed with results. 

## 2016-01-25 ENCOUNTER — Ambulatory Visit (INDEPENDENT_AMBULATORY_CARE_PROVIDER_SITE_OTHER): Payer: Medicaid Other | Admitting: Neurology

## 2016-01-25 ENCOUNTER — Encounter: Payer: Self-pay | Admitting: Neurology

## 2016-01-25 VITALS — BP 107/70 | HR 78 | Ht 67.0 in | Wt 274.6 lb

## 2016-01-25 DIAGNOSIS — G932 Benign intracranial hypertension: Secondary | ICD-10-CM | POA: Diagnosis not present

## 2016-01-25 DIAGNOSIS — R7303 Prediabetes: Secondary | ICD-10-CM | POA: Insufficient documentation

## 2016-01-25 DIAGNOSIS — G609 Hereditary and idiopathic neuropathy, unspecified: Secondary | ICD-10-CM | POA: Diagnosis not present

## 2016-01-25 DIAGNOSIS — M791 Myalgia, unspecified site: Secondary | ICD-10-CM

## 2016-01-25 DIAGNOSIS — M25579 Pain in unspecified ankle and joints of unspecified foot: Secondary | ICD-10-CM

## 2016-01-25 DIAGNOSIS — E538 Deficiency of other specified B group vitamins: Secondary | ICD-10-CM

## 2016-01-25 MED ORDER — CYANOCOBALAMIN 2000 MCG PO TABS: 2000.0000 ug | ORAL_TABLET | Freq: Every day | ORAL | Status: DC

## 2016-01-25 NOTE — Progress Notes (Signed)
Cardiology Office Note   Date:  01/25/2016   ID:  Janice Hanson, DOB [birth year], MRN [id number]  PCP:  Janice BloodGARBA,LAWAL, MD  Cardiologist:   Janice Hookandolph, Karalyn Kadel P, MD   No chief complaint on file.   Patient ID: Janice Hanson is a 50 y.o. female with GERD who presents for an evaluation of chest pain.    Interval History 01/26/16: After her last appointment [name] [facility].  It was recommended that she abstain from fasting and increase her fluid intake in order to prevent syncope.   History of Present Illness 10/28/15:  [name] speaks Arabic and this interview is accomplished through her daughter, who serves as a Nurse, learning disabilitytranslator.  [name] refuses a Librarian, academicC[employer] translator.  [name] reports one year of intermittent chest pain.  The pain is a pulling sensation that is 5 out of 10 in severity. It is substernal and does not radiate. There is no associated shortness of breath or diaphoresis, but she does endorse nausea.  He episodes occur every other day and last for 2-3 minutes at a time. She does not exert herself other than walking, but it does not worsen as she walks.  She has noted exertional shortness of breath.  The pain was worse 2 weeks ago but now is less intense. It sometimes occurs when laying down.  She has not tried taking any medications for her symptoms.  [name] presented to the ED on 12/9 with weakness, hypotension and headache.  At the time she was noted to be hypotensive and received IV fluids. She reports additional episodes of syncope since that time, but none in the last week. She fasts from sun up to sundown on Mondays and Thursdays.  She does so for religious reasons, though this is not mandatory her daughter.  Prior to passing out she notes palpitations and fatigue.  She denies any preceding chest pain or shortness of breath.  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  She was previously treated with Diamox and  lumbar punctures.  Her last LP in June had an opening pressure of 19.5 (normal) and she is being cared for by Dr. Lucia GaskinsAhern of Neurology.  She was recently referred to an Opthalmologist.      Past Medical History  Diagnosis Date  . Abnormal finding in CSF   . Low blood pressure   . Blindness - both eyes   . GERD (gastroesophageal reflux disease)   . Obesity   . Pseudomembranous colitis   . Pseudotumor cerebri   . Kidney stones   . Arthritis   . Hypothyroidism   . Helicobacter pylori gastritis 11/20/2014  . Syncope 10/31/2015    Past Surgical History  Procedure Laterality Date  . Tonsillectomy       Current Outpatient Prescriptions  Medication Sig Dispense Refill  . CVS VITAMIN D3 1000 units capsule Take 1,000 Units by mouth daily.  2  . cyanocobalamin 2000 MCG tablet Take 1 tablet (2,000 mcg total) by mouth daily. 90 tablet 4  . ibuprofen (ADVIL,MOTRIN) 800 MG tablet Take 800 mg by mouth 2 (two) times daily.  2  . omeprazole (PRILOSEC) 40 MG capsule Take 1 capsule by mouth daily.  0   No current facility-administered medications for this visit.    Allergies:   Review of patient's allergies indicates no known allergies.    Social History:  The patient  reports that she has never smoked. She has never  used smokeless tobacco. She reports that she does not drink alcohol or use illicit drugs.   Family History:  The patient's family history includes Diabetes in her brother, maternal grandmother, and sister; Heart disease in her maternal grandmother and paternal grandfather; Kidney Stones in her maternal uncle.    ROS:  Please see the history of present illness.   Otherwise, review of systems are positive for none.   All other systems are reviewed and negative.    PHYSICAL EXAM: VS:  There were no vitals taken for this visit. , BMI There is no weight on file to calculate BMI. GENERAL:  Well appearing HEENT:  Pupils equal round and reactive, fundi not visualized, oral mucosa  unremarkable NECK:  No jugular venous distention, waveform within normal limits, carotid upstroke brisk and symmetric, no bruits, no thyromegaly LYMPHATICS:  No cervical adenopathy LUNGS:  Clear to auscultation bilaterally HEART:  RRR.  PMI not displaced or sustained,S1 and S2 within normal limits, no S3, no S4, no clicks, no rubs, no murmurs ABD:  Flat, positive bowel sounds normal in frequency in pitch, no bruits, no rebound, no guarding, no midline pulsatile mass, no hepatomegaly, no splenomegaly EXT:  2 plus pulses throughout, no edema, no cyanosis no clubbing SKIN:  No rashes no nodules NEURO:  Cranial nerves II through XII grossly intact, motor grossly intact throughout PSYCH:  Cognitively intact, oriented to person place and time   EKG:  EKG is ordered today. The ekg ordered today demonstrates sinus rhythm rate 68 bpm.   Recent Labs: 04/17/2015: B Natriuretic Peptide 16.5 10/02/2015: Hemoglobin 12.9; Platelets 237 10/07/2015: ALT 10; BUN 11; Creatinine, Ser 0.66; Potassium 3.9; Sodium 140; TSH 1.670    Lipid Panel No results found for: CHOL, TRIG, HDL, CHOLHDL, VLDL, LDLCALC, LDLDIRECT    Wt Readings from Last 3 Encounters:  01/25/16 124.558 kg (274 lb 9.6 oz)  11/10/15 127.461 kg (281 lb)  11/04/15 127.642 kg (281 lb 6.4 oz)      ASSESSMENT AND PLAN:  # Atypical chest pain:  Symptoms are very atypical and unlikely to be due to ischemia. We will obtain a Lexiscan Cardiolite she is unable to walk on a treadmill due to blindness.  Symptoms may also be related to GERD or musculoskeletal disease.  # Syncope: Likely related to fasting.  She was not orthostatic today.  She last fasted it 2 days ago.  She had an echo in 2015 that revealed a structurally normal heart with mild left atrial enlargement and no other significant abnormalities. We discussed the fact that she may need to obtain a rectal exception from fasting, as it is causing hypotension and syncope.  We also discussed  alternatives such as fasting from specific foods.  She is not orthostatic today.   Current medicines are reviewed at length with the patient today.  The patient does not have concerns regarding medicines.  The following changes have been made:  no change  Labs/ tests ordered today include:   No orders of the defined types were placed in this encounter.     Disposition:   FU with Jan Walters C. Duke Salvia, MD, [facility] in 3 months.    This note was written with the assistance of speech recognition software.  Please excuse any transcriptional errors.  Signed, Quamaine Webb C. Duke Salvia, MD, [facility]  01/25/2016 10:20 PM    [facility] This encounter was created in error - please disregard.

## 2016-01-25 NOTE — Progress Notes (Signed)
[facility]    Provider: Dr Lucia GaskinsAhern Referring Provider: Rometta EmeryGarba, Mohammad L, MD Primary Care Physician: Lonia BloodGARBA,LAWAL, MD  CC: IIH  Interval update 01/25/2016: 50 year old patient with a past medical history IIH with secondary optic nerve atrophy. She is no longer on Diamox recent LPs have showed normal opening pressure. LPs performed every year for surveillance. She denies any recent headaches or new vision changes. Today she describes that her feet are getting smaller and she is having numbness in her toes. She also complains of muscle pain and joint pain today.  Labs tested for paresthesias at last appointment:  Ace, HIv, sed rate, ANA, rpr, b1, thyroid, hep c, heavy metals, IFE, crp, lyme. Also tested NMO antibodies which were negative.  b12 was low, 218 and she has not taken supplements despite being advised to. Needs to take 2000 g oral B12 daily for B12 deficiency. Discussed that B12 deficiency can cause numbness and paresthesias as well as dementia. hgba1c 6.3, discussed again. Diet, exercise and follow up with primary care  Will repeat lumbar puncure as well. [place]. Would like LP next week.   Interval Update 11/04/2015: She has a weird feeling in the back of her right knee. It is very painful. Her toes are "separating". No tingling or burning in the feet. But the toes are "separating". She has pain on the top of the feet when she presses on it. No numbness, no burning, no tingling. She had tingling last time but it has gone away. Pain in the knee started 1 month - 3 weeks ago. She has had 3 headaches in the last month. They last a day or two. She has some shooting pain in the feet and tingling. She snores at night. But not excessively and not excessively tired during the day. Sleep eval has been normal in the past    HPI: Janice Hanson is a 50 y.o. female here as a referral from Dr. Mikeal HawthorneGarba for pseudotumor cerebri. She has had LPs  with opening pressure of 26 in July 2015 and opening pressure of 19.5 in June of 2016. Her daughter is an interpreter and they decline any [facility] interpreters and sign a release form. Husband also in the room and provides information. IIH started in 1994 with severe headaches, vision changes, then complete blindness in both eyes. She has been treated with different steroid injections. She was treated with diamox. She was on diamox for 2 months and then stopped. She stopped the Diamox on her own because it was making her urinate. She stopped the diamox in 2002 and has not been treated since then. She was seen years ago by Dr. Lissa MoralesWeyman in the old building for at least 5 years. Vision never improved. She can see some light in the left eye. Little vision in the right eye. Now she gets yearly LPs to check the opening pressure. She has been to the emergency room three times in the last few weeks for severe hypotension, slurred speech, left-sided weakness. She is having headaches in the back of her head. Like someone is banging on her head. Like a buzzing the right side of the head twice. She snores at night. She has gained weight. She is excessively tired during the day, very sleepy. She wakes up with morning headaches. Her feet has needles in the feet. Her toes are separated. However her Epworth sleepiness scale is only a 1. She reports tingling in the feet. No other focal neurologic deficits.  Reviewed notes, labs and imaging from outside physicians, which showed:  CLINICAL DATA: Headache and fatigue ; nausea and vomiting. Left-sided numbness and slurred speech  EXAM: CT HEAD WITHOUT CONTRAST  TECHNIQUE: Contiguous axial images were obtained from the base of the skull through the vertex without intravenous contrast.  COMPARISON: April 17, 2015  CT of the head and 10/13/2015 (personally reviewed images and agree with the following): The ventricles are normal in size and configuration. There is no  intracranial mass hemorrhage, extra-axial fluid collection, or midline shift. Gray-white compartments are normal. There is no acute infarct evident. The bony calvarium appears intact. The mastoid air cells are clear. Visualized orbits appear normal.  IMPRESSION: Study within normal limits and stable.  Review of Systems: Patient complains of symptoms per HPI as well as the following symptoms: swelling in the legs, .feeling hot, feeling cold, joint pain Pertinent negatives per HPI. All others negative.   Social History   Social History  . Marital Status: Single    Spouse Name: N/A  . Number of Children: 4  . Years of Education: N/A   Occupational History  . na    Social History Main Topics  . Smoking status: Never Smoker   . Smokeless tobacco: Never Used  . Alcohol Use: No  . Drug Use: No  . Sexual Activity: Not on file   Other Topics Concern  . Not on file   Social History Narrative   Married originally from [place]   4 children 2 girls/2 boys   Caffeine use: 1-2 cups tea daily       Family History  Problem Relation Age of Onset  . Diabetes Brother   . Heart disease Paternal Grandfather   . Kidney Stones Maternal Uncle   . Diabetes Maternal Grandmother   . Heart disease Maternal Grandmother   . Diabetes Sister     Past Medical History  Diagnosis Date  . Abnormal finding in CSF   . Low blood pressure   . Blindness - both eyes   . GERD (gastroesophageal reflux disease)   . Obesity   . Pseudomembranous colitis   . Pseudotumor cerebri   . Kidney stones   . Arthritis   . Hypothyroidism   . Helicobacter pylori gastritis 11/20/2014  . Syncope 10/31/2015    Past Surgical History  Procedure Laterality Date  . Tonsillectomy      Current Outpatient Prescriptions  Medication Sig Dispense Refill  . CVS VITAMIN D3 1000 units capsule Take 1,000 Units by mouth daily.  2  . ibuprofen (ADVIL,MOTRIN) 800 MG tablet Take 800 mg by mouth 2 (two) times daily.  2  .  omeprazole (PRILOSEC) 40 MG capsule Take 1 capsule by mouth daily.  0   No current facility-administered medications for this visit.    Allergies as of 01/25/2016  . (No Known Allergies)    Vitals: BP 107/70 mmHg  Pulse 78  Ht 5\' 7"  (1.702 m)  Wt 274 lb 9.6 oz (124.558 kg)  BMI 43.00 kg/m2 Last Weight:  Wt Readings from Last 1 Encounters:  01/25/16 274 lb 9.6 oz (124.558 kg)   Last Height:   Ht Readings from Last 1 Encounters:  01/25/16 5\' 7"  (1.702 m)   Cranial Nerves:  The pupils are equal, round, and reactive to light. Optic nerve pallor, no disk edema. Cannot see waving fingers in the periphery on the right. Can see waving fingers in all quadrants on the left except upper outer left quadrant. She can count  fingers in the central fields of the left eye. Can see shadows in the central vision of the right eye. Extraocular movements are intact. Trigeminal sensation is intact and the muscles of mastication are normal. The face is symmetric. The palate elevates in the midline. Hearing intact. Voice is normal. Shoulder shrug is normal. The tongue has normal motion without fasciculations.   Coordination:  No dysmetria  Gait:  Wide based likely due to body habitus  Motor Observation:  No asymmetry, no atrophy, and no involuntary movements noted. Tone:  Normal muscle tone.   Sensory: intact to pin prick, temperature, vibration and proprioception distally. No sensory abnormalities on exam.   Strength: 5/5 including distally in the LE and feet      Assessment/Plan:  50 year old female with past medical history with a history of blindness reportedly secondary to pseudotumor cerebri. She is here for evaluation of headaches. She hasn't been treated with Diamox for many years and her lumbar puncture in June of this year had an opening pressure of 19.5 which is normal. She has yearly lumbar punctures for surveillance.  She has been evaluated by ophthalmology. Dr. Dione Booze  recommends NMO lab testing, normal MRI of the brain for episodes of left-sided weakness and slurred speech as well as headache: Nonspecific white matter changes and enlarged partially empty sella. No acute events. Knee pain: XR unremarkable Podiatrist for foot pain, sensory exam normal, no symptoms of peripheral neuropathy.   Labs tested for paresthesias at last appointment:  Ace, HIv, sed rate, ANA, rpr, b1, thyroid, hep c, heavy metals, IFE, crp, lyme. Also tested NMO antibodies which were negative.  B12 was low, 218 and she has not taken supplements despite being advised to. Needs to take 2000 mcg oral B12 daily for B12 deficiency. Discussed that B12 deficiency can cause numbness and paresthesias as well as dementia. hgba1c 6.3, discussed again. Diet, exercise and follow up with primary care  Will repeat lumbar puncure as well. Leaving in [place] for a [place]. Would like LP next week.   Check CK, vitamin D today due to her complaints of muscle pain and joint pain If CK will hold off on emg/ncs until she get back and see if the b12 helps.  To prevent or relieve headaches, try the following:  Cool Compress. Lie down and place a cool compress on your head.   Avoid headache triggers. If certain foods or odors seem to have triggered your migraines in the past, avoid them. A headache diary might help you identify triggers.   Include physical activity in your daily routine. Try a daily walk or other moderate aerobic exercise.   Manage stress. Find healthy ways to cope with the stressors, such as delegating tasks on your to-do list.   Practice relaxation techniques. Try deep breathing, yoga, massage and visualization.   Eat regularly. Eating regularly scheduled meals and maintaining a healthy diet might help prevent headaches. Also, drink plenty of fluids.   Follow a regular sleep schedule. Sleep deprivation might contribute to headaches  Consider biofeedback. With this  mind-body technique, you learn to control certain bodily functions - such as muscle tension, heart rate and blood pressure - to prevent headaches or reduce headache pain.   Proceed to emergency room if you experience new or worsening symptoms or symptoms do not resolve, if you have new neurologic symptoms or if headache is severe, or for any concerning symptom.   Naomie Dean, MD  [facility] [address] [place],  [place] [address]  Phone [phone] Fax [phone]  A total of 30 minutes was spent face-to-face with this patient. Over half this time was spent on counseling patient on the IIH, b12 neuropathy and prediabetes diagnosis and different diagnostic and therapeutic options available.

## 2016-01-25 NOTE — Patient Instructions (Addendum)
B12 - 2000mcg a day. Check vitamin D Check muscle enzymes Order a lumbar puncture See you back in september

## 2016-01-26 ENCOUNTER — Encounter: Payer: Medicaid Other | Admitting: Cardiovascular Disease

## 2016-01-26 MED ORDER — LIDOCAINE 5 % EX PTCH: 3.0000 | MEDICATED_PATCH | CUTANEOUS | Status: DC

## 2016-01-26 NOTE — Addendum Note (Signed)
Addended by: Naomie DeanAHERN, Corrigan Kretschmer B on: 01/26/2016 05:26 PM   Modules accepted: Orders

## 2016-01-27 ENCOUNTER — Encounter: Payer: Self-pay | Admitting: *Deleted

## 2016-01-27 LAB — VITAMIN D PNL(25-HYDRXY+1,25-DIHY)-BLD
Vit D, 1,25-Dihydroxy: 35.4 pg/mL (ref 19.9–79.3)
Vit D, 25-Hydroxy: 25.5 ng/mL — ABNORMAL LOW (ref 30.0–100.0)

## 2016-01-27 LAB — CK: Total CK: 54 U/L (ref 24–173)

## 2016-01-28 ENCOUNTER — Telehealth: Payer: Self-pay | Admitting: *Deleted

## 2016-01-28 NOTE — Telephone Encounter (Signed)
LVM for daughter Janice Hanson(Janice Hanson) to call about results. Gave [facility] phone number.

## 2016-01-28 NOTE — Telephone Encounter (Signed)
-----   Message from Anson FretAntonia B Ahern, MD sent at 01/27/2016  6:32 PM EDT ----- Her vitamin D level is low normal. Her muscle enzyme, CK is normal. thanks

## 2016-02-01 ENCOUNTER — Other Ambulatory Visit: Payer: Self-pay | Admitting: Neurology

## 2016-02-01 MED ORDER — LIDOCAINE 5 % EX OINT: 1.0000 "application " | TOPICAL_OINTMENT | CUTANEOUS | Status: DC | PRN

## 2016-02-01 NOTE — Telephone Encounter (Signed)
I'll take care of it thanks! 

## 2016-02-01 NOTE — Telephone Encounter (Signed)
Dr Lucia GaskinsAhern- Dr Marjory LiesPenumalli already sent in rx lidocaine ointment, thank you!

## 2016-02-01 NOTE — Telephone Encounter (Signed)
-----   Message from Hillis RangeEmma L King, RN sent at 02/01/2016  7:57 AM EDT ----- Dr Marjory LiesPenumalli-  Can you please prescribe lidocaine ointment for this patient? The patches are not covered by their insurance. This would be a new medication for the the patient and not a medication refill. Dr Lucia GaskinsAhern is out of the office this week.  Thank you, Kara MeadEmma  ----- Message -----    From: Anson FretAntonia B Ahern, MD    Sent: 01/31/2016   2:44 PM      To: Hillis RangeEmma L King, RN  Yes please send the ointment! Thanks!  ----- Message -----    From: Hillis RangeEmma L King, RN    Sent: 01/28/2016   5:10 PM      To: Anson FretAntonia B Ahern, MD  Dr Lucia GaskinsAhern- pt insurance will not cover lidocaine patch. Do you want to send in the ointment instead?

## 2016-02-02 ENCOUNTER — Telehealth: Payer: Self-pay | Admitting: *Deleted

## 2016-02-02 NOTE — Telephone Encounter (Signed)
LVM for daughter about lab results per Dr Lucia GaskinsAhern note. Gave [facility] phone number if she has further questions.

## 2016-02-04 ENCOUNTER — Other Ambulatory Visit: Payer: Self-pay | Admitting: Neurology

## 2016-02-05 ENCOUNTER — Ambulatory Visit
Admission: RE | Admit: 2016-02-05 | Discharge: 2016-02-05 | Disposition: A | Discharge status: Home / Self Care | Payer: Medicaid Other | Source: Ambulatory Visit | Attending: Neurology | Admitting: Neurology

## 2016-02-05 DIAGNOSIS — G932 Benign intracranial hypertension: Secondary | ICD-10-CM

## 2016-02-05 LAB — CSF CELL COUNT WITH DIFFERENTIAL
RBC Count, CSF: 0 cells/uL (ref 0–10)
WBC, CSF: 0 cells/uL (ref 0–5)

## 2016-02-05 LAB — GLUCOSE, CSF: Glucose, CSF: 69 mg/dL (ref 43–76)

## 2016-02-05 LAB — PROTEIN, CSF: Total Protein, CSF: 39 mg/dL (ref 15–45)

## 2016-02-05 IMAGING — XA DG FLUORO GUIDE SPINAL/SI JT INJ*R*
1 series · 1 of 1 positions shown · non-contrast
Comparison: none

CLINICAL DATA: Idiopathic intracranial hypertension.

[Series 1: ortho adipose · 1 of 1 slices shown]
[im 1/1]
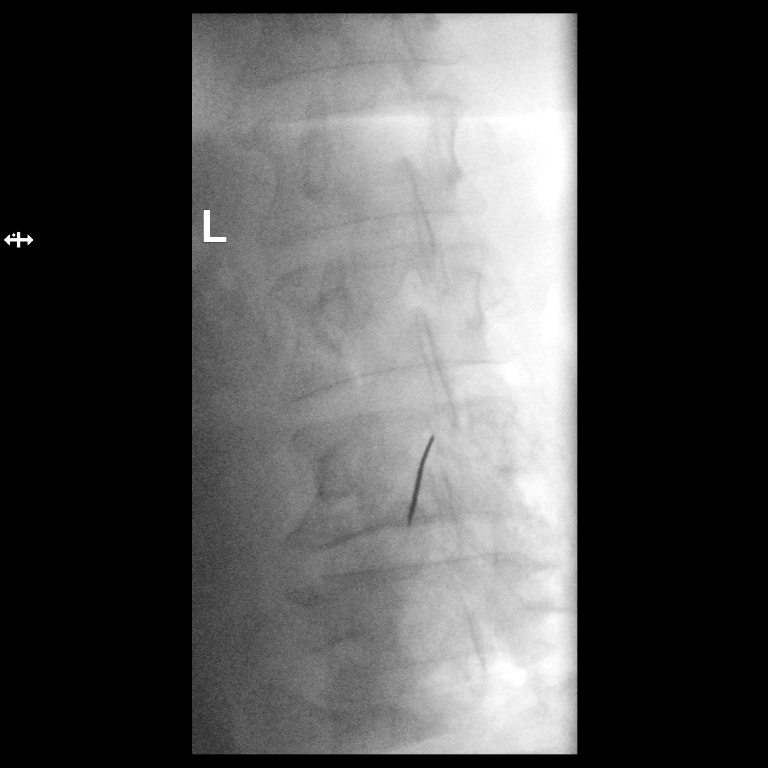

[1 of 1 positions shown; findings below may reference images not displayed]

EXAM:
DIAGNOSTIC LUMBAR PUNCTURE UNDER FLUOROSCOPIC GUIDANCE

FLUOROSCOPY TIME:  Radiation Exposure Index (as provided by the
fluoroscopic device): 146.94 microGray*m^2

Fluoroscopy Time (in minutes and seconds):  0 minutes 19 seconds

PROCEDURE:
Informed consent was obtained from the patient prior to the
procedure, including potential complications of headache, allergy,
and pain. The patient's daughter provided language interpretation.
With the patient in the left lateral decubitus position, the lower
back was prepped with Betadine. 1% Lidocaine was used for local
anesthesia. Lumbar puncture was performed at the L3-4 level using a
6 inch, 20 gauge needle via a left interlaminar approach with return
of clear CSF with an opening pressure of 24 cm water. 5.5 ml of CSF
were obtained for laboratory studies. The patient tolerated the
procedure well and there were no apparent complications.
IMPRESSION: Successful fluoroscopic guided lumbar puncture. Opening pressure 24.

## 2016-02-05 NOTE — Progress Notes (Signed)
Discharge instructions reviewed with daughter who is interpreting for patient.  jkl

## 2016-02-05 NOTE — Discharge Instructions (Signed)

## 2016-02-08 ENCOUNTER — Telehealth: Payer: Self-pay | Admitting: Neurology

## 2016-02-08 DIAGNOSIS — H9201 Otalgia, right ear: Secondary | ICD-10-CM

## 2016-02-08 NOTE — Telephone Encounter (Signed)
Daughter Margaretmary LombardFatima [phone] called to advise, patient had lumbar puncture on Friday 02/05/16 and started losing some of her hearing. Wants to know if this is to be expected? Will her hearing come back? Please call to advise.

## 2016-02-08 NOTE — Telephone Encounter (Signed)
Chart reviewed, patient had history of pseudotumor cerebri, MRI of the brain with and without contrast was normal in January 2017, most recent lumbar puncture was February 05 2016, open pressure was 24 cm water  I have called her daughter at [phone], She complains of right ear discomfort since her most recent lumbar puncture, as if water is inside her right ear, also complains of decreased hearing,  I ordered ENT referral,

## 2016-02-10 ENCOUNTER — Telehealth: Payer: Self-pay | Admitting: *Deleted

## 2016-02-10 ENCOUNTER — Ambulatory Visit (INDEPENDENT_AMBULATORY_CARE_PROVIDER_SITE_OTHER): Payer: Medicaid Other

## 2016-02-10 ENCOUNTER — Ambulatory Visit (INDEPENDENT_AMBULATORY_CARE_PROVIDER_SITE_OTHER): Payer: Medicaid Other | Admitting: Podiatry

## 2016-02-10 ENCOUNTER — Encounter: Payer: Self-pay | Admitting: Podiatry

## 2016-02-10 VITALS — BP 102/60 | HR 96 | Resp 12

## 2016-02-10 DIAGNOSIS — M79672 Pain in left foot: Principal | ICD-10-CM

## 2016-02-10 DIAGNOSIS — M7741 Metatarsalgia, right foot: Secondary | ICD-10-CM

## 2016-02-10 DIAGNOSIS — M7742 Metatarsalgia, left foot: Secondary | ICD-10-CM

## 2016-02-10 DIAGNOSIS — M7740 Metatarsalgia, unspecified foot: Secondary | ICD-10-CM | POA: Diagnosis not present

## 2016-02-10 DIAGNOSIS — M79671 Pain in right foot: Secondary | ICD-10-CM

## 2016-02-10 NOTE — Progress Notes (Signed)
   Subjective:    Patient ID: Janice Hanson, female    DOB: Aug 11, 1966, 50 y.o.   MRN: [id number]  HPI    This patient presents today with her daughter in the treatment room who is speaking and responding to questions for her mother. Her mother is from [place] and has limited language skills. Her daughter states that her mother was complaining of two-month history and the plantar MPJ right and left upon weight-bearing and walking with his symptoms really relieved with rest and elevation. Patient's daughter says that her mother does wear shoes when she walks.  Patient's medical history does include blindness History of neuropathy Prediabetes  Review of Systems  Musculoskeletal: Positive for gait problem.       Objective:   Physical Exam  The patient does respond to questioning with her daughter interpreting for her Patient is visually impaired  Vascular: DP pulses 2/4 bilaterally PT pulses 1/4 bilaterally Capillary refill immediate bilaterally  Neurological: Sensation to 10 g monofilament wire intact 5/5 bilaterally Vibratory sensation reactive bilaterally Ankle reflex equal and reactive bilaterally  Dermatological: Tattoos appear  on the anterior lower legs bilaterally Tattoo staining plantar medial plantar right and left feet. Daughter explains this is a custom in their country of [place]. No open skin lesions bilaterally No corns or calluses noted bilaterally  Musculoskeletal: Weightbearing patient has stable gait Pes planus bilaterally Upon weight-bearing when patient stands on toes the right and left heels invert There is no crepitus or pain around range of motion of the ankle, subtalar, mid tarsal, metatarsal phalangeal joints bilaterally There is palpable tenderness plantarly to the second, third, fourth MPJs bilaterally without any palpable lesions. These areas seem to duplicates areas of discomfort that patient is complaining about There is a palpable bony  exostosis dorsal base first left metatarsal cuneiform area the patient also mentions has some occasional discomfort  X-ray examination weightbearing right foot dated 02/10/2016 Intact bony structure without fracture and/or dislocation Bone and joint spaces appear adequate Inferior calcaneal spur Radiographic impression: No acute bony abnormality noted in the right foot, x-ray dated 02/10/2016  X-ray examination weightbearing left foot dated 02/10/2016 Intact bony structures without fracture or dislocation Bone and joint density appears adequate in all views Hypertrophy/ exostosis dorsal base first metatarsal cuneiform Inferior calcaneal heel spur Radiographic impression: No acute bony abnormality noted in the left foot  x-ray of 02/10/2016        Assessment & Plan:   Assessment: Metatarsalgia bilaterally  Plan: At this time I reviewed the results of the examination and x-ray with patient and patient's daughter treatment room. I informed him that the symptoms are most consistent with metatarsalgia. I recommend a athletic or walking style shoe with an additional soft insole with a metatarsal raise to insert inside the shoes. Also, I dispensed small additional metatarsal felt pads and demonstrated correct placement of these pads on the soft insoles to further offload the MPJ area  Reappoint at patient's request

## 2016-02-10 NOTE — Patient Instructions (Signed)
Today your foot examination confirmed a low-grade chronic irritation in the ball of the right and left feet known as metatarsalgia, achiness in the balls of the feet Please wear a walking or running style shoe with a soft insole with a metatarsal raise as we discussed Also, if necessary okay to attach the additional felt pad in the metatarsal raise to provide more relief

## 2016-02-10 NOTE — Telephone Encounter (Signed)
Called and spoke to daughter about LP results per Dr Lucia GaskinsAhern note. Also made f/u for 07/19/15 at 1130am. She verbalized understanding. She stated pt will be back 3rd week of Sept from traveling.

## 2016-02-10 NOTE — Telephone Encounter (Signed)
-----   Message from Anson FretAntonia B Ahern, MD sent at 02/09/2016  2:52 PM EDT ----- Kara MeadEmma, opening pressure was 24.It is consistent with the opening pressure in 2015 and slightly increased from 2016.  I'm not concerned about this opening pressure. Have her follo wup with me when she comes back from traveling thanks

## 2016-02-16 ENCOUNTER — Telehealth: Payer: Self-pay | Admitting: Neurology

## 2016-02-16 NOTE — Telephone Encounter (Signed)
Called and spoke to patient's daughter relaying Patients's PCP has to place referral for ENT. Patient daughter understood. I called [facility]  [phone] fax [phone]. Relayed about PCP and referral for ENT alsoad [facility]

## 2016-07-18 ENCOUNTER — Telehealth: Payer: Self-pay | Admitting: *Deleted

## 2016-07-18 ENCOUNTER — Ambulatory Visit: Payer: Self-pay | Admitting: Neurology

## 2016-07-18 NOTE — Telephone Encounter (Signed)
no showed f/u 

## 2016-07-25 ENCOUNTER — Encounter: Payer: Self-pay | Admitting: Neurology

## 2016-08-23 ENCOUNTER — Emergency Department (HOSPITAL_COMMUNITY)
Admission: EM | Admit: 2016-08-23 | Discharge: 2016-08-24 | Disposition: A | Discharge status: Home / Self Care | Payer: Medicaid Other | Attending: Emergency Medicine | Admitting: Emergency Medicine

## 2016-08-23 ENCOUNTER — Emergency Department (HOSPITAL_COMMUNITY): Payer: Medicaid Other

## 2016-08-23 ENCOUNTER — Encounter (HOSPITAL_COMMUNITY): Payer: Self-pay | Admitting: Emergency Medicine

## 2016-08-23 DIAGNOSIS — R079 Chest pain, unspecified: Secondary | ICD-10-CM

## 2016-08-23 DIAGNOSIS — E039 Hypothyroidism, unspecified: Secondary | ICD-10-CM | POA: Diagnosis not present

## 2016-08-23 DIAGNOSIS — K219 Gastro-esophageal reflux disease without esophagitis: Secondary | ICD-10-CM | POA: Diagnosis not present

## 2016-08-23 DIAGNOSIS — R0789 Other chest pain: Secondary | ICD-10-CM | POA: Diagnosis not present

## 2016-08-23 LAB — BASIC METABOLIC PANEL
Anion gap: 7 (ref 5–15)
BUN: 8 mg/dL (ref 6–20)
CO2: 28 mmol/L (ref 22–32)
Calcium: 9 mg/dL (ref 8.9–10.3)
Chloride: 105 mmol/L (ref 101–111)
Creatinine, Ser: 0.73 mg/dL (ref 0.44–1.00)
GFR calc Af Amer: >60 mL/min (ref >60)
GFR calc non Af Amer: >60 mL/min (ref >60)
Glucose, Bld: 103 mg/dL — ABNORMAL HIGH (ref 65–99)
Potassium: 3.6 mmol/L (ref 3.5–5.1)
Sodium: 140 mmol/L (ref 135–145)

## 2016-08-23 LAB — CBC
HCT: 40 % (ref 36.0–46.0)
Hemoglobin: 13.4 g/dL (ref 12.0–15.0)
MCH: 27.9 pg (ref 26.0–34.0)
MCHC: 33.5 g/dL (ref 30.0–36.0)
MCV: 83.2 fL (ref 78.0–100.0)
Platelets: 267 10*3/uL (ref 150–400)
RBC: 4.81 MIL/uL (ref 3.87–5.11)
RDW: 14.3 % (ref 11.5–15.5)
WBC: 6.2 10*3/uL (ref 4.0–10.5)

## 2016-08-23 LAB — I-STAT TROPONIN, ED: Troponin i, poc: 0 ng/mL (ref 0.00–0.08)

## 2016-08-23 NOTE — ED Triage Notes (Signed)
Pt reports centralized chest tightness starting about 2130.  When EMS arrived she was vomiting (which she reports tastes "acidic") .  12 lead was unremarkable, was given 324 ASA and 1 nitro, w/ no change.  Hx of GERD, legally blind and speaks Arabic (daughter translates.)  Upon arriving at ED, reports she "feels better"

## 2016-08-23 NOTE — ED Provider Notes (Signed)
[facility]-EMERGENCY DEPT Provider Note   CSN: [id number] Arrival date & time: 08/23/16  2151     History   Chief Complaint Chief Complaint  Patient presents with  . Chest Pain    HPI Imonie Tuch is a 50 y.o. female.  Patient with a history of total blindness, IIH, kidney stones, B12 deficiency, GERD, medication noncompliance, hypothyroid presents with family who are translating with complaint of chest discomfort, SOB and vomiting. The patient was in bed and felt she was falling out. When she put herself back into bed she started having symptoms of chest pain and began vomiting. She reports since that time she has had 3 episodes of chest pain lasting approximately 5 minutes. No nausea, just vomiting. She reports symptoms are worse when lying down and improved with sitting up. No history of CAD. Ne fever, cough, extremity edema, abdominal pain or diarrhea.    The history is provided by the patient and a relative. A language interpreter was used (Family at bedside translating).    Past Medical History:  Diagnosis Date  . Abnormal finding in CSF   . Arthritis   . Blindness - both eyes   . GERD (gastroesophageal reflux disease)   . Helicobacter pylori gastritis 11/20/2014  . Hypothyroidism   . Kidney stones   . Low blood pressure   . Obesity   . Pseudomembranous colitis   . Pseudotumor cerebri   . Syncope 10/31/2015    Patient Active Problem List   Diagnosis Date Noted  . Hereditary and idiopathic peripheral neuropathy 01/25/2016  . B12 deficiency 01/25/2016  . Pre-diabetes 01/25/2016  . Syncope 10/31/2015  . Helicobacter pylori gastritis 11/20/2014  . Chest pain, atypical 08/16/2014  . SOB (shortness of breath) 08/16/2014  . Obese 08/16/2014  . Pseudotumor cerebri 08/16/2014  . Blindness 08/16/2014  . Chest pain 08/16/2014  . GERD (gastroesophageal reflux disease) 08/16/2014    Past Surgical History:  Procedure Laterality Date  . TONSILLECTOMY      OB History    No data available       Home Medications    Prior to Admission medications   Medication Sig Start Date End Date Taking? Authorizing Provider  [facility] VITAMIN D3 1000 units capsule Take 1,000 Units by mouth daily. 12/14/15  Yes Historical Provider, MD  cyanocobalamin 2000 MCG tablet Take 1 tablet (2,000 mcg total) by mouth daily. 01/25/16  Yes Anson Fret, MD  ibuprofen (ADVIL,MOTRIN) 800 MG tablet Take 800 mg by mouth 2 (two) times daily. 12/25/15  Yes Historical Provider, MD  lidocaine (XYLOCAINE) 5 % ointment Apply 1 application topically as needed. Patient taking differently: Apply 1 application topically daily as needed for mild pain.  02/01/16  Yes Suanne Marker, MD  omeprazole (PRILOSEC) 40 MG capsule Take 1 capsule by mouth daily. 09/29/15  Yes Historical Provider, MD    Family History Family History  Problem Relation Age of Onset  . Diabetes Brother   . Heart disease Paternal Grandfather   . Kidney Stones Maternal Uncle   . Diabetes Maternal Grandmother   . Heart disease Maternal Grandmother   . Diabetes Sister     Social History Social History  Substance Use Topics  . Smoking status: Never Smoker  . Smokeless tobacco: Never Used  . Alcohol use No     Allergies   Review of patient's allergies indicates no known allergies.   Review of Systems Review of Systems  Constitutional: Negative for chills, diaphoresis and fever.  HENT: Negative.  Respiratory: Negative.  Negative for shortness of breath.   Cardiovascular: Positive for chest pain.  Gastrointestinal: Positive for vomiting. Negative for abdominal pain and nausea.  Musculoskeletal: Negative.  Negative for myalgias.  Neurological: Negative.  Negative for syncope, weakness and headaches.     Physical Exam Updated Vital Signs BP 144/84   Pulse 79   Temp 98.1 F (36.7 C) (Oral)   Resp 16   Ht 5\' 6"  (1.676 m)   LMP 04/22/2016 Comment: irregular periods  SpO2 100%   Physical Exam  Constitutional:  She is oriented to person, place, and time. She appears well-developed and well-nourished.  HENT:  Head: Normocephalic.  Neck: Normal range of motion. Neck supple.  Cardiovascular: Normal rate and regular rhythm.   No murmur heard. Pulmonary/Chest: Effort normal and breath sounds normal. She has no wheezes. She has no rales.  Abdominal: Soft. Bowel sounds are normal. There is no tenderness. There is no rebound and no guarding.  Musculoskeletal: Normal range of motion. She exhibits no edema.  Neurological: She is alert and oriented to person, place, and time.  Skin: Skin is warm and dry. No rash noted.  Psychiatric: She has a normal mood and affect.     ED Treatments / Results  Labs (all labs ordered are listed, but only abnormal results are displayed) Labs Reviewed  BASIC METABOLIC PANEL - Abnormal; Notable for the following:       Result Value   Glucose, Bld 103 (*)    All other components within normal limits  CBC  I-STAT TROPOININ, ED   Results for orders placed or performed during the hospital encounter of 08/23/16  Basic metabolic panel  Result Value Ref Range   Sodium 140 135 - 145 mmol/L   Potassium 3.6 3.5 - 5.1 mmol/L   Chloride 105 101 - 111 mmol/L   CO2 28 22 - 32 mmol/L   Glucose, Bld 103 (H) 65 - 99 mg/dL   BUN 8 6 - 20 mg/dL   Creatinine, Ser 1.610.73 0.44 - 1.00 mg/dL   Calcium 9.0 8.9 - 09.610.3 mg/dL   GFR calc non Af Amer >60 >60 mL/min   GFR calc Af Amer >60 >60 mL/min   Anion gap 7 5 - 15  CBC  Result Value Ref Range   WBC 6.2 4.0 - 10.5 K/uL   RBC 4.81 3.87 - 5.11 MIL/uL   Hemoglobin 13.4 12.0 - 15.0 g/dL   HCT 04.540.0 40.936.0 - 81.146.0 %   MCV 83.2 78.0 - 100.0 fL   MCH 27.9 26.0 - 34.0 pg   MCHC 33.5 30.0 - 36.0 g/dL   RDW 91.414.3 78.211.5 - 95.615.5 %   Platelets 267 150 - 400 K/uL  I-stat troponin, ED  Result Value Ref Range   Troponin i, poc 0.00 0.00 - 0.08 ng/mL   Comment 3           Results for orders placed or performed during the hospital encounter of  08/23/16  Basic metabolic panel  Result Value Ref Range   Sodium 140 135 - 145 mmol/L   Potassium 3.6 3.5 - 5.1 mmol/L   Chloride 105 101 - 111 mmol/L   CO2 28 22 - 32 mmol/L   Glucose, Bld 103 (H) 65 - 99 mg/dL   BUN 8 6 - 20 mg/dL   Creatinine, Ser 2.130.73 0.44 - 1.00 mg/dL   Calcium 9.0 8.9 - 08.610.3 mg/dL   GFR calc non Af Amer >60 >60 mL/min   GFR calc  Af Amer >60 >60 mL/min   Anion gap 7 5 - 15  CBC  Result Value Ref Range   WBC 6.2 4.0 - 10.5 K/uL   RBC 4.81 3.87 - 5.11 MIL/uL   Hemoglobin 13.4 12.0 - 15.0 g/dL   HCT 16.140.0 09.636.0 - 04.546.0 %   MCV 83.2 78.0 - 100.0 fL   MCH 27.9 26.0 - 34.0 pg   MCHC 33.5 30.0 - 36.0 g/dL   RDW 40.914.3 81.111.5 - 91.415.5 %   Platelets 267 150 - 400 K/uL  I-stat troponin, ED  Result Value Ref Range   Troponin i, poc 0.00 0.00 - 0.08 ng/mL   Comment 3          I-stat troponin, ED  Result Value Ref Range   Troponin i, poc 0.00 0.00 - 0.08 ng/mL   Comment 3             EKG  EKG Interpretation None       Radiology No results found. Dg Chest 2 View  Result Date: 08/23/2016 CLINICAL DATA:  Chest pain, onset EXAM: CHEST  2 VIEW COMPARISON:  10/03/2015 FINDINGS: The lungs are clear. The pulmonary vasculature is normal. Heart size is normal. Hilar and mediastinal contours are unremarkable. There is no pleural effusion. IMPRESSION: No active cardiopulmonary disease. Electronically Signed   By: Ellery Plunkaniel R Mitchell M.D.   On: 08/23/2016 23:56    Procedures Procedures (including critical care time)  Medications Ordered in ED Medications - No data to display   Initial Impression / Assessment and Plan / ED Course  I have reviewed the triage vital signs and the nursing notes.  Pertinent labs & imaging results that were available during my care of the patient were reviewed by me and considered in my medical decision making (see chart for details).  Clinical Course    Patient with chest pain that started yesterday evening, episodic, associated with  vomiting. Negative EKG, troponin x 2, CXR clear, no heart history. Pain was worse with lying down and resolved with GI cocktail in setting of history of GERD and medication noncompliance.   She is pain free currently. Pain/symptoms thought secondary to GERD. She can discharged home with PCP follow up. Patient and family are agreeable to care plan.  Final Clinical Impressions(s) / ED Diagnoses   Final diagnoses:  None   1. Nonspecific chest pain 2. GERD  New Prescriptions New Prescriptions   No medications on file     Elpidio AnisShari Sanye Ledesma, PA-C 08/24/16 [id number]    Shon Batonourtney F Horton, MD 08/24/16 [id number]

## 2016-08-24 LAB — I-STAT TROPONIN, ED: Troponin i, poc: 0 ng/mL (ref 0.00–0.08)

## 2016-08-24 MED ORDER — GI COCKTAIL ~~LOC~~
30.0000 mL | Freq: Once | ORAL | Status: AC
  Administered 2016-08-24: 30 mL via ORAL
  Filled 2016-08-24: qty 30

## 2016-08-24 NOTE — Discharge Instructions (Signed)
Continue your medications for reflux. Follow up with your doctor for recheck this week.

## 2016-10-07 ENCOUNTER — Encounter (HOSPITAL_BASED_OUTPATIENT_CLINIC_OR_DEPARTMENT_OTHER): Payer: Self-pay | Admitting: *Deleted

## 2016-10-07 NOTE — Progress Notes (Signed)
Pt speaks arabic. Son interpreted via 3-way call.  Pt instructed npo p mn 12/17 x prilosec w sip of water.  [facility] @ 1030. Needs hgb on arrival.  Ekg, cxr in chart.  Interpreter refused.  Son or spouse will interpret .

## 2016-10-10 ENCOUNTER — Ambulatory Visit (HOSPITAL_BASED_OUTPATIENT_CLINIC_OR_DEPARTMENT_OTHER): Payer: Medicaid Other | Admitting: Anesthesiology

## 2016-10-10 ENCOUNTER — Ambulatory Visit (HOSPITAL_COMMUNITY): Payer: Medicaid Other

## 2016-10-10 ENCOUNTER — Ambulatory Visit (HOSPITAL_BASED_OUTPATIENT_CLINIC_OR_DEPARTMENT_OTHER)
Admission: RE | Admit: 2016-10-10 | Discharge: 2016-10-10 | Disposition: A | Discharge status: Home / Self Care | Payer: Medicaid Other | Source: Ambulatory Visit | Attending: Orthopedic Surgery | Admitting: Orthopedic Surgery

## 2016-10-10 ENCOUNTER — Encounter (HOSPITAL_BASED_OUTPATIENT_CLINIC_OR_DEPARTMENT_OTHER)
Admission: RE | Disposition: A | Discharge status: Home / Self Care | Payer: Self-pay | Source: Ambulatory Visit | Attending: Orthopedic Surgery

## 2016-10-10 ENCOUNTER — Encounter (HOSPITAL_BASED_OUTPATIENT_CLINIC_OR_DEPARTMENT_OTHER): Payer: Self-pay | Admitting: Anesthesiology

## 2016-10-10 DIAGNOSIS — Z6841 Body Mass Index (BMI) 40.0 and over, adult: Secondary | ICD-10-CM | POA: Insufficient documentation

## 2016-10-10 DIAGNOSIS — S83231A Complex tear of medial meniscus, current injury, right knee, initial encounter: Secondary | ICD-10-CM

## 2016-10-10 DIAGNOSIS — M84469A Pathological fracture, unspecified tibia and fibula, initial encounter for fracture: Secondary | ICD-10-CM | POA: Diagnosis present

## 2016-10-10 DIAGNOSIS — E039 Hypothyroidism, unspecified: Secondary | ICD-10-CM | POA: Insufficient documentation

## 2016-10-10 DIAGNOSIS — H547 Unspecified visual loss: Secondary | ICD-10-CM | POA: Diagnosis not present

## 2016-10-10 DIAGNOSIS — Z87442 Personal history of urinary calculi: Secondary | ICD-10-CM | POA: Insufficient documentation

## 2016-10-10 DIAGNOSIS — M23221 Derangement of posterior horn of medial meniscus due to old tear or injury, right knee: Secondary | ICD-10-CM | POA: Diagnosis present

## 2016-10-10 DIAGNOSIS — S83249A Other tear of medial meniscus, current injury, unspecified knee, initial encounter: Secondary | ICD-10-CM | POA: Diagnosis present

## 2016-10-10 DIAGNOSIS — M65861 Other synovitis and tenosynovitis, right lower leg: Secondary | ICD-10-CM | POA: Insufficient documentation

## 2016-10-10 DIAGNOSIS — Z8719 Personal history of other diseases of the digestive system: Secondary | ICD-10-CM | POA: Insufficient documentation

## 2016-10-10 DIAGNOSIS — Z419 Encounter for procedure for purposes other than remedying health state, unspecified: Secondary | ICD-10-CM

## 2016-10-10 DIAGNOSIS — M84461A Pathological fracture, right tibia, initial encounter for fracture: Secondary | ICD-10-CM | POA: Insufficient documentation

## 2016-10-10 DIAGNOSIS — K219 Gastro-esophageal reflux disease without esophagitis: Secondary | ICD-10-CM | POA: Insufficient documentation

## 2016-10-10 DIAGNOSIS — M1990 Unspecified osteoarthritis, unspecified site: Secondary | ICD-10-CM | POA: Insufficient documentation

## 2016-10-10 HISTORY — PX: KNEE ARTHROSCOPY: SHX127

## 2016-10-10 HISTORY — DX: Unspecified tear of unspecified meniscus, current injury, right knee, initial encounter: S83.206A

## 2016-10-10 LAB — POCT I-STAT 4, (NA,K, GLUC, HGB,HCT)
Glucose, Bld: 99 mg/dL (ref 65–99)
HCT: 43 % (ref 36.0–46.0)
Hemoglobin: 14.6 g/dL (ref 12.0–15.0)
Potassium: 3.7 mmol/L (ref 3.5–5.1)
Sodium: 143 mmol/L (ref 135–145)

## 2016-10-10 SURGERY — ARTHROSCOPY, KNEE
Anesthesia: General | Site: Knee | Laterality: Right

## 2016-10-10 MED ORDER — DEXTROSE 5 % IV SOLN
3.0000 g | INTRAVENOUS | Status: AC
  Administered 2016-10-10: 3 g via INTRAVENOUS
  Filled 2016-10-10: qty 3000

## 2016-10-10 MED ORDER — FENTANYL CITRATE (PF) 100 MCG/2ML IJ SOLN
INTRAMUSCULAR | Status: AC
  Filled 2016-10-10: qty 2

## 2016-10-10 MED ORDER — MORPHINE SULFATE (PF) 4 MG/ML IV SOLN
INTRAVENOUS | Status: AC
  Filled 2016-10-10: qty 1

## 2016-10-10 MED ORDER — OXYCODONE HCL 5 MG PO TABS
5.0000 mg | ORAL_TABLET | Freq: Once | ORAL | Status: AC
  Administered 2016-10-10: 5 mg via ORAL
  Filled 2016-10-10: qty 1

## 2016-10-10 MED ORDER — MIDAZOLAM HCL 5 MG/5ML IJ SOLN
INTRAMUSCULAR | Status: DC | PRN
  Administered 2016-10-10 (×2): 1 mg via INTRAVENOUS

## 2016-10-10 MED ORDER — DEXAMETHASONE SODIUM PHOSPHATE 10 MG/ML IJ SOLN
INTRAMUSCULAR | Status: AC
  Filled 2016-10-10: qty 1

## 2016-10-10 MED ORDER — OXYCODONE HCL 5 MG PO TABS
ORAL_TABLET | ORAL | Status: AC
  Filled 2016-10-10: qty 1

## 2016-10-10 MED ORDER — SODIUM CHLORIDE 0.9 % IR SOLN
Status: DC | PRN
  Administered 2016-10-10: 6000 mL

## 2016-10-10 MED ORDER — ONDANSETRON HCL 4 MG/2ML IJ SOLN
INTRAMUSCULAR | Status: AC
Start: 2016-10-10 — End: 2016-10-10
  Filled 2016-10-10: qty 2

## 2016-10-10 MED ORDER — LACTATED RINGERS IV SOLN
INTRAVENOUS | Status: DC
  Administered 2016-10-10 (×2): via INTRAVENOUS
  Filled 2016-10-10: qty 1000

## 2016-10-10 MED ORDER — FENTANYL CITRATE (PF) 100 MCG/2ML IJ SOLN
INTRAMUSCULAR | Status: DC | PRN
  Administered 2016-10-10 (×2): 50 ug via INTRAVENOUS
  Administered 2016-10-10: 25 ug via INTRAVENOUS
  Administered 2016-10-10: 100 ug via INTRAVENOUS
  Administered 2016-10-10 (×3): 25 ug via INTRAVENOUS

## 2016-10-10 MED ORDER — FENTANYL CITRATE (PF) 100 MCG/2ML IJ SOLN
25.0000 ug | INTRAMUSCULAR | Status: DC | PRN
  Administered 2016-10-10 (×2): 50 ug via INTRAVENOUS
  Filled 2016-10-10: qty 1

## 2016-10-10 MED ORDER — PROPOFOL 10 MG/ML IV BOLUS
INTRAVENOUS | Status: DC | PRN
Start: 2016-10-10 — End: 2016-10-10
  Administered 2016-10-10: 40 mg via INTRAVENOUS
  Administered 2016-10-10: 60 mg via INTRAVENOUS
  Administered 2016-10-10: 200 mg via INTRAVENOUS

## 2016-10-10 MED ORDER — PROMETHAZINE HCL 25 MG/ML IJ SOLN
6.2500 mg | INTRAMUSCULAR | Status: DC | PRN
  Administered 2016-10-10: 6.25 mg via INTRAVENOUS
  Filled 2016-10-10: qty 1

## 2016-10-10 MED ORDER — CHLORHEXIDINE GLUCONATE 4 % EX LIQD
60.0000 mL | Freq: Once | CUTANEOUS | Status: DC
  Filled 2016-10-10: qty 118

## 2016-10-10 MED ORDER — DEXAMETHASONE SODIUM PHOSPHATE 4 MG/ML IJ SOLN
INTRAMUSCULAR | Status: DC | PRN
Start: 2016-10-10 — End: 2016-10-10
  Administered 2016-10-10: 10 mg via INTRAVENOUS

## 2016-10-10 MED ORDER — ONDANSETRON 4 MG PO TBDP: 4.0000 mg | ORAL_TABLET | Freq: Three times a day (TID) | ORAL | 0 refills | Status: DC | PRN

## 2016-10-10 MED ORDER — KETOROLAC TROMETHAMINE 30 MG/ML IJ SOLN
30.0000 mg | Freq: Once | INTRAMUSCULAR | Status: AC | PRN
  Administered 2016-10-10: 30 mg via INTRAVENOUS
  Filled 2016-10-10: qty 1

## 2016-10-10 MED ORDER — LIDOCAINE 2% (20 MG/ML) 5 ML SYRINGE
INTRAMUSCULAR | Status: DC | PRN
  Administered 2016-10-10: 60 mg via INTRAVENOUS

## 2016-10-10 MED ORDER — CEFAZOLIN SODIUM-DEXTROSE 2-4 GM/100ML-% IV SOLN
INTRAVENOUS | Status: AC
  Filled 2016-10-10: qty 100

## 2016-10-10 MED ORDER — MIDAZOLAM HCL 2 MG/2ML IJ SOLN
INTRAMUSCULAR | Status: AC
  Filled 2016-10-10: qty 2

## 2016-10-10 MED ORDER — PROMETHAZINE HCL 25 MG/ML IJ SOLN
INTRAMUSCULAR | Status: AC
  Filled 2016-10-10: qty 1

## 2016-10-10 MED ORDER — BUPIVACAINE HCL 0.25 % IJ SOLN
INTRAMUSCULAR | Status: DC | PRN
  Administered 2016-10-10: 30 mL

## 2016-10-10 MED ORDER — ONDANSETRON HCL 4 MG/2ML IJ SOLN
INTRAMUSCULAR | Status: DC | PRN
Start: 2016-10-10 — End: 2016-10-10
  Administered 2016-10-10: 4 mg via INTRAVENOUS

## 2016-10-10 MED ORDER — OXYCODONE HCL 5 MG PO TABS
5.0000 mg | ORAL_TABLET | ORAL | 0 refills | Status: DC | PRN
Start: 2016-10-10 — End: 2017-01-24

## 2016-10-10 MED ORDER — PROPOFOL 10 MG/ML IV BOLUS
INTRAVENOUS | Status: AC
  Filled 2016-10-10: qty 40

## 2016-10-10 MED ORDER — EPINEPHRINE PF 1 MG/ML IJ SOLN
INTRAMUSCULAR | Status: AC
  Filled 2016-10-10: qty 1

## 2016-10-10 MED ORDER — KETOROLAC TROMETHAMINE 30 MG/ML IJ SOLN
INTRAMUSCULAR | Status: AC
  Filled 2016-10-10: qty 1

## 2016-10-10 MED ORDER — CEFAZOLIN IN D5W 1 GM/50ML IV SOLN
INTRAVENOUS | Status: AC
Start: 2016-10-10 — End: 2016-10-10
  Filled 2016-10-10: qty 50

## 2016-10-10 MED ORDER — ASPIRIN EC 325 MG PO TBEC: 325.0000 mg | DELAYED_RELEASE_TABLET | Freq: Every day | ORAL | 0 refills | Status: DC

## 2016-10-10 MED ORDER — BUPIVACAINE HCL (PF) 0.5 % IJ SOLN
INTRAMUSCULAR | Status: AC
  Filled 2016-10-10: qty 30

## 2016-10-10 SURGICAL SUPPLY — 52 items
ACCUFILL 5 CC ×2 IMPLANT
ACCUMIX MIXING SYSTEM ×2 IMPLANT
BANDAGE ACE 6X5 VEL STRL LF (GAUZE/BANDAGES/DRESSINGS) ×2 IMPLANT
BANDAGE ELASTIC 6 VELCRO ST LF (GAUZE/BANDAGES/DRESSINGS) ×2 IMPLANT
BANDAGE ESMARK 6X9 LF (GAUZE/BANDAGES/DRESSINGS) ×1 IMPLANT
BLADE CUDA GRT WHITE 3.5 (BLADE) ×2 IMPLANT
BLADE CUTTER GATOR 3.5 (BLADE) ×2 IMPLANT
BLADE GREAT WHITE 4.2 (BLADE) IMPLANT
BNDG ESMARK 6X9 LF (GAUZE/BANDAGES/DRESSINGS) ×2
CANISTER SUCTION 1200CC (MISCELLANEOUS) ×2 IMPLANT
CUFF TOURNIQUET SINGLE 34IN LL (TOURNIQUET CUFF) ×2 IMPLANT
DRAPE ARTHROSCOPY W/POUCH 114 (DRAPES) ×2 IMPLANT
DRAPE INCISE 23X17 IOBAN STRL (DRAPES) ×1
DRAPE INCISE IOBAN 23X17 STRL (DRAPES) ×1 IMPLANT
DRAPE INCISE IOBAN 66X45 STRL (DRAPES) ×2 IMPLANT
DRAPE LG THREE QUARTER DISP (DRAPES) ×2 IMPLANT
DRAPE U-SHAPE 47X51 STRL (DRAPES) ×2 IMPLANT
DRSG PAD ABDOMINAL 8X10 ST (GAUZE/BANDAGES/DRESSINGS) ×2 IMPLANT
DURAPREP 26ML APPLICATOR (WOUND CARE) ×2 IMPLANT
GAUZE XEROFORM 1X8 LF (GAUZE/BANDAGES/DRESSINGS) ×2 IMPLANT
GLOVE BIO SURGEON STRL SZ7.5 (GLOVE) ×2 IMPLANT
GLOVE BIOGEL PI IND STRL 8 (GLOVE) ×1 IMPLANT
GLOVE BIOGEL PI INDICATOR 8 (GLOVE) ×1
GOWN STRL REUS W/ TWL XL LVL3 (GOWN DISPOSABLE) IMPLANT
GOWN STRL REUS W/TWL XL LVL3 (GOWN DISPOSABLE)
IV NS IRRIG 3000ML ARTHROMATIC (IV SOLUTION) ×4 IMPLANT
KIT MIXER ACCUMIX (KITS) ×2 IMPLANT
KIT ROOM TURNOVER WOR (KITS) ×2 IMPLANT
KNEE KIT SCP W/SIDE ACCUPORT (Joint) ×2 IMPLANT
KNEE WRAP E Z 3 GEL PACK (MISCELLANEOUS) ×2 IMPLANT
MANIFOLD NEPTUNE II (INSTRUMENTS) IMPLANT
MINI VAC (SURGICAL WAND) IMPLANT
NEEDLE HYPO 22GX1.5 SAFETY (NEEDLE) IMPLANT
PACK ARTHROSCOPY DSU (CUSTOM PROCEDURE TRAY) ×2 IMPLANT
PACK BASIN DAY SURGERY FS (CUSTOM PROCEDURE TRAY) ×2 IMPLANT
PAD ABD 8X10 STRL (GAUZE/BANDAGES/DRESSINGS) ×4 IMPLANT
PAD ARMBOARD 7.5X6 YLW CONV (MISCELLANEOUS) IMPLANT
PAD CAST 4YDX4 CTTN HI CHSV (CAST SUPPLIES) ×1 IMPLANT
PADDING CAST COTTON 4X4 STRL (CAST SUPPLIES) ×1
PROBE BIPOLAR 50 DEGREE SUCT (MISCELLANEOUS) ×2 IMPLANT
SET ARTHROSCOPY TUBING (MISCELLANEOUS) ×1
SET ARTHROSCOPY TUBING LN (MISCELLANEOUS) ×1 IMPLANT
SPONGE GAUZE 4X4 12PLY (GAUZE/BANDAGES/DRESSINGS) ×2 IMPLANT
SPONGE GAUZE 4X4 12PLY STER LF (GAUZE/BANDAGES/DRESSINGS) ×2 IMPLANT
SUBCHONDROPLASTY ×2 IMPLANT
SUT ETHILON 3 0 PS 1 (SUTURE) ×2 IMPLANT
SYR CONTROL 10ML LL (SYRINGE) ×2 IMPLANT
TOWEL OR 17X24 6PK STRL BLUE (TOWEL DISPOSABLE) ×2 IMPLANT
TUBE CONNECTING 12X1/4 (SUCTIONS) ×2 IMPLANT
WAND 30 DEG SABER W/CORD (SURGICAL WAND) IMPLANT
WAND 90 DEG TURBOVAC W/CORD (SURGICAL WAND) IMPLANT
WATER STERILE IRR 500ML POUR (IV SOLUTION) ×2 IMPLANT

## 2016-10-10 NOTE — Op Note (Signed)
Surgery Date: 10/10/2016  Surgeon(s): Yolonda KidaJason Patrick Rogers, MD  ANESTHESIA:  general  FLUIDS: Per anesthesia record.   ESTIMATED BLOOD LOSS: minimal  PREOPERATIVE DIAGNOSES:  1. Right knee medial meniscus tear 2.  Right lateral tibial plateau insufficiency fracture 3.  knee synovitis  POSTOPERATIVE DIAGNOSES:  same  PROCEDURES PERFORMED:  1. Right knee arthroscopically aided treatment of lateral tibial plateau insufficiency fracture with internal fixation (subchondroplasty) ([id number]) 2. Right knee arthroscopy with arthroscopic partial medial meniscectomy 3.  Chondroplasty, right medial femoral condyle  Implant: Flowable calcium phosphate, 5 mL.  DESCRIPTION OF PROCEDURE: [name] is a 50 y.o.-year-old female with right knee medial meniscus tear. She has had pain that has been refractory to conservative management. Her preoperative MRI demonstrated subchondral bone marrow edema and insufficiency fractures of the lateral tibial plateau as well as the medial meniscus tear. Plans are to proceed with partial medial meniscectomy, internal fixation of subchondral insufficiency fractures with flowable calcium phosphate, and diagnostic arthroscopy with debridement as indicated. Full discussion held regarding risks benefits alternatives and complications related surgical intervention. Conservative care options reviewed. All questions answered.  The patient was identified in the preoperative holding area and the operative extremity was marked. The patient was brought to the operating room and transferred to operating table in a supine position. Satisfactory general anesthesia was induced by anesthesiology.    Standard anterolateral, anteromedial arthroscopy portals were obtained. The anteromedial portal was obtained with a spinal needle for localization under direct visualization with subsequent diagnostic findings.   Anteromedial and anterolateral chambers: moderate synovitis. The synovitis  was debrided with a 4.5 mm full radius shaver through both the anteromedial and lateral portals.   Suprapatellar pouch and gutters: mild synovitis or debris. Patella chondral surface: Grade 1 Trochlear chondral surface: Grade 0 Patellofemoral tracking: level Medial meniscus: posterior horn horizontal tearing.  Medial femoral condyle flexion bearing surface: Grade 3 Medial femoral condyle extension bearing surface: Grade 2 Medial tibial plateau: Grade 0 Anterior cruciate ligament:stable Posterior cruciate ligament:stable Lateral meniscus: no tear.   Lateral femoral condyle flexion bearing surface: Grade 1 Lateral femoral condyle extension bearing surface: Grade 0 Lateral tibial plateau: Grade 1  Medial meniscus tear was debrided using biters and motorized shaver alternating until a stable remnant was left. Upon completion the probe was used to evaluate and assess the remaining meniscus which was gleaned to be stable.  Chondroplasty was achieved on the medial femoral condyle using a motorized shaver to debride the grade 3 unstable cartilage. Completion of the chondroplasty left A medial femoral condyle with smooth stable surface. There was no full-thickness component noted.  Next we turned our attention to the internal fixation of the lateral tibial plateau. Arthroscopically we evaluated tibial plateau noted there was no loose cartilage or debris surrounding the lesion and the fracture did not propagate to the joint surface. Using preoperative MRI we targeted R delivery device to just under the subchondral density and in the midportion in an anterior to posterior orientation of the tibial plateau. This was achieved with intraoperative fluoroscopy. Once accurate placement was noted on 2 views and confirmed we delivered 5 mL of flowable calcium phosphate into the proximal lateral tibial plateau. We left the cannulas in place for approximately 8 minutes while the implant hardened. We removed the  cannulas and again took 2 views of fluoroscopic pictures to confirm there was no extravasation outside of the bone. We then reentered the joint with the arthroscope to evaluate for intra-articular extravasation. There was none noted.  After  completion of synovectomy, diagnostic exam, and debridements as described, all compartments were checked and no residual debris remained. Hemostasis was achieved with the cautery wand. The portals were approximated with buried monocryl. All excess fluid was expressed from the joint.  Xeroform sterile gauze dressings were applied followed by Ace bandage and ice pack.   DISPOSITION: Jerra Turnbaugh was awakened from general anesthetic, extubated, taken to the recovery room in medically stable condition, no apparent complications. The patient may be weightbearing as tolerated to the operative lower extremity.  Range of motion of right knee as tolerated.

## 2016-10-10 NOTE — Anesthesia Postprocedure Evaluation (Signed)
Anesthesia Post Note  Patient: Stefan Dia  Procedure(s) Performed: Procedure(s) (LRB): ARTHROSCOPY KNEE WITH PARTIAL MEDIAL MENISECTOMY AND LATERAL SUBCHONDROPLASTY (Right)  Patient location during evaluation: PACU Anesthesia Type: General Level of consciousness: awake and alert and patient cooperative Pain management: pain level controlled Vital Signs Assessment: post-procedure vital signs reviewed and stable Respiratory status: spontaneous breathing, nonlabored ventilation and respiratory function stable Cardiovascular status: blood pressure returned to baseline and stable Postop Assessment: no signs of nausea or vomiting Anesthetic complications: no       Last Vitals:  Vitals:   10/10/16 1530 10/10/16 1600  BP: (!) 154/92 (!) 151/97  Pulse: 66 64  Resp: 15 16  Temp:      Last Pain:  Vitals:   10/10/16 1545  TempSrc:   PainSc: Asleep                 Fathima Bartl,E. Leaann Nevils

## 2016-10-10 NOTE — Transfer of Care (Signed)
Immediate Anesthesia Transfer of Care Note  Patient: Janice Hanson  Procedure(s) Performed: Procedure(s) (LRB): ARTHROSCOPY KNEE WITH PARTIAL MEDIAL MENISECTOMY AND LATERAL SUBCHONDROPLASTY (Right)  Patient Location: PACU  Anesthesia Type: General  Level of Consciousness: awake, sedated, patient cooperative and responds to stimulation  Airway & Oxygen Therapy: Patient Spontanous Breathing and Patient connected to [place] O2  Post-op Assessment: Report given to PACU RN, Post -op Vital signs reviewed and stable and Patient moving all extremities  Post vital signs: Reviewed and stable  Complications: No apparent anesthesia complications

## 2016-10-10 NOTE — Anesthesia Preprocedure Evaluation (Addendum)
Anesthesia Evaluation  Patient identified by MRN, date of birth, ID band Patient awake    Reviewed: Allergy & Precautions, NPO status , Patient's Chart, lab work & pertinent test results  Airway Mallampati: III  TM Distance: <3 FB Neck ROM: Full    Dental  (+) Teeth Intact, Dental Advisory Given   Pulmonary neg pulmonary ROS,    breath sounds clear to auscultation       Cardiovascular negative cardio ROS   Rhythm:Regular Rate:Normal  08-16-14 2D ECHO Left ventricle: The cavity size was normal. There was mild concentric hypertrophy. Systolic function was normal. The estimated ejection fraction was in the range of 55% to 60%. Wall motion was normal; there were no regional wall motion abnormalities.   Neuro/Psych  Neuromuscular disease    GI/Hepatic Neg liver ROS, GERD  ,  Endo/Other  Hypothyroidism Morbid obesity  Renal/GU Renal disease     Musculoskeletal  (+) Arthritis ,   Abdominal   Peds  Hematology negative hematology ROS (+)   Anesthesia Other Findings   Reproductive/Obstetrics                          Lab Results  Component Value Date   WBC 6.2 08/23/2016   HGB 13.4 08/23/2016   HCT 40.0 08/23/2016   MCV 83.2 08/23/2016   PLT 267 08/23/2016   Lab Results  Component Value Date   CREATININE 0.73 08/23/2016   BUN 8 08/23/2016   NA 140 08/23/2016   K 3.6 08/23/2016   CL 105 08/23/2016   CO2 28 08/23/2016    Anesthesia Physical Anesthesia Plan  ASA: III  Anesthesia Plan: General   Post-op Pain Management:    Induction: Intravenous  Airway Management Planned: LMA  Additional Equipment:   Intra-op Plan:   Post-operative Plan: Extubation in OR  Informed Consent: I have reviewed the patients History and Physical, chart, labs and discussed the procedure including the risks, benefits and alternatives for the proposed anesthesia with the patient or authorized  representative who has indicated his/her understanding and acceptance.   Dental advisory given  Plan Discussed with: CRNA  Anesthesia Plan Comments:         Anesthesia Quick Evaluation

## 2016-10-10 NOTE — H&P (Signed)
ORTHOPAEDIC CONSULTATION  REQUESTING PHYSICIAN: Yolonda KidaJason Patrick Laurissa Cowper, MD  PCP:  Lonia BloodGARBA,LAWAL, MD  Chief Complaint: right knee pain  HPI: Janice Hanson is a 50 y.o. female who complains of  Right knee pain for many months.  She has failed conservative management with injections and exercise program.  An MRI was obtained showing medial meniscus tear with associated lateral tibial plateau insufficiency fractures.  She is here today for artrhoscopy and management with partial medial meniscectomy and lateral tibial plateau subchondroplasty.  Past Medical History:  Diagnosis Date  . Abnormal finding in CSF   . Arthritis   . Blindness - both eyes    can see shadows w/ left eye  . GERD (gastroesophageal reflux disease)   . Helicobacter pylori gastritis 11/20/2014  . Hypothyroidism    pt denies  . Kidney stones   . Low blood pressure   . Obesity   . Pseudomembranous colitis    hx of  . Pseudotumor cerebri    followed by Dr. Lucia GaskinsAhern @ [facility]  . Right knee meniscal tear    w/ lateral plateau stress fx  . Syncope 10/31/2015   Past Surgical History:  Procedure Laterality Date  . TONSILLECTOMY     Social History   Social History  . Marital status: Single    Spouse name: N/A  . Number of children: 4  . Years of education: N/A   Occupational History  . na    Social History Main Topics  . Smoking status: Never Smoker  . Smokeless tobacco: Never Used  . Alcohol use No  . Drug use: No  . Sexual activity: Not Asked   Other Topics Concern  . None   Social History Narrative   Married originally from [place]   4 children 2 girls/2 boys   Caffeine use: 1-2 cups tea daily      Family History  Problem Relation Age of Onset  . Diabetes Brother   . Heart disease Paternal Grandfather   . Kidney Stones Maternal Uncle   . Diabetes Maternal Grandmother   . Heart disease Maternal Grandmother   . Diabetes Sister    No Known Allergies Prior to Admission medications     Medication Sig Start Date End Date Taking? Authorizing Provider  [employer] VITAMIN D3 1000 units capsule Take 1,000 Units by mouth daily. 12/14/15  Yes Historical Provider, MD  ibuprofen (ADVIL,MOTRIN) 800 MG tablet Take 800 mg by mouth 2 (two) times daily. 12/25/15  Yes Historical Provider, MD  lidocaine (XYLOCAINE) 5 % ointment Apply 1 application topically as needed. Patient taking differently: Apply 1 application topically daily as needed for mild pain.  02/01/16  Yes Suanne MarkerVikram R Penumalli, MD  omeprazole (PRILOSEC) 40 MG capsule Take 1 capsule by mouth daily. 09/29/15  Yes Historical Provider, MD  cyanocobalamin 2000 MCG tablet Take 1 tablet (2,000 mcg total) by mouth daily. 01/25/16   Anson FretAntonia B Ahern, MD   No results found.  Positive ROS: All other systems have been reviewed and were otherwise negative with the exception of those mentioned in the HPI and as above.  Physical Exam: General: Alert, no acute distress Cardiovascular: No pedal edema Respiratory: No cyanosis, no use of accessory musculature GI: No organomegaly, abdomen is soft and non-tender Skin: No lesions in the area of chief complaint Neurologic: Sensation intact distally Psychiatric: Patient is competent for consent with normal mood and affect Lymphatic: No axillary or cervical lymphadenopathy    Assessment: Right knee medial mensicus tear Right lateral tibial  plateau insufficiency fracture  Plan: -OR today for arthrscopy -case discussed with patient and her family, plan for dc home following surgery -risks, benefits and recovery period reviewed and all questions answered.    Yolonda KidaJason Patrick Telesia Ates, MD Cell [phone]    10/10/2016 12:17 PM

## 2016-10-10 NOTE — Discharge Instructions (Signed)

## 2016-10-10 NOTE — Anesthesia Procedure Notes (Signed)
Procedure Name: LMA Insertion Date/Time: 10/10/2016 12:35 PM Performed by: Tyrone NineSAUVE, Hodan Wurtz F Pre-anesthesia Checklist: Patient identified, Emergency Drugs available, Suction available, Patient being monitored and Timeout performed Patient Re-evaluated:Patient Re-evaluated prior to inductionOxygen Delivery Method: Circle system utilized Preoxygenation: Pre-oxygenation with 100% oxygen Intubation Type: IV induction Ventilation: Mask ventilation without difficulty LMA: LMA inserted LMA Size: 4.0 Number of attempts: 1 Placement Confirmation: breath sounds checked- equal and bilateral and positive ETCO2 Tube secured with: Tape Dental Injury: Teeth and Oropharynx as per pre-operative assessment

## 2016-10-11 ENCOUNTER — Encounter (HOSPITAL_BASED_OUTPATIENT_CLINIC_OR_DEPARTMENT_OTHER): Payer: Self-pay | Admitting: Orthopedic Surgery

## 2016-10-18 ENCOUNTER — Encounter: Payer: Self-pay | Admitting: Physical Therapy

## 2016-10-18 ENCOUNTER — Ambulatory Visit: Payer: Medicaid Other | Attending: Orthopedic Surgery | Admitting: Physical Therapy

## 2016-10-18 DIAGNOSIS — M25561 Pain in right knee: Secondary | ICD-10-CM | POA: Insufficient documentation

## 2016-10-18 DIAGNOSIS — M23321 Other meniscus derangements, posterior horn of medial meniscus, right knee: Secondary | ICD-10-CM | POA: Diagnosis present

## 2016-10-18 DIAGNOSIS — R262 Difficulty in walking, not elsewhere classified: Secondary | ICD-10-CM | POA: Insufficient documentation

## 2016-10-18 DIAGNOSIS — M25661 Stiffness of right knee, not elsewhere classified: Secondary | ICD-10-CM | POA: Insufficient documentation

## 2016-10-18 DIAGNOSIS — M84469A Pathological fracture, unspecified tibia and fibula, initial encounter for fracture: Secondary | ICD-10-CM | POA: Insufficient documentation

## 2016-10-18 NOTE — Therapy (Signed)
[facility] [address] [place], [place], [address] Phone: [phone]   Fax:  [phone]  Physical Therapy Evaluation  Patient Details  Name: Janice Hanson MRN: [id number] Date of Birth: 04-08-1966 Referring Provider: Maryan Rued, MD  Encounter Date: 10/18/2016      PT End of Session - 10/18/16 1025    Visit Number 1   Number of Visits --  awaiting medicaid approval   Date for PT Re-Evaluation 12/02/16   Authorization Type Medicaid- waiting for authorization   PT Start Time 1015   PT Stop Time 1118   PT Time Calculation (min) 63 min   Activity Tolerance Patient tolerated treatment well   Behavior During Therapy [facility] for tasks assessed/performed      Past Medical History:  Diagnosis Date  . Abnormal finding in CSF   . Arthritis   . Blindness - both eyes    can see shadows w/ left eye  . GERD (gastroesophageal reflux disease)   . Helicobacter pylori gastritis 11/20/2014  . Hypothyroidism    pt denies  . Kidney stones   . Low blood pressure   . Obesity   . Pseudomembranous colitis    hx of  . Pseudotumor cerebri    followed by Dr. Lucia Gaskins @ [facility]  . Right knee meniscal tear    w/ lateral plateau stress fx  . Syncope 10/31/2015    Past Surgical History:  Procedure Laterality Date  . KNEE ARTHROSCOPY Right 10/10/2016   Procedure: ARTHROSCOPY KNEE WITH PARTIAL MEDIAL MENISECTOMY AND LATERAL SUBCHONDROPLASTY;  Surgeon: Yolonda Kida, MD;  Location: [facility];  Service: Orthopedics;  Laterality: Right;  . TONSILLECTOMY      There were no vitals filed for this visit.       Subjective Assessment - 10/18/16 1050    Subjective Interpretation from interpreter, daughter and husband. June 29 pt twisted her leg in attempt to stop from falling, resulting in meniscus tear. Is using a rolling walker. Has not been using ice on knee.    Limitations Standing;Walking   Currently in  Pain? Yes   Pain Score 5    Pain Location Knee   Pain Orientation Right   Pain Descriptors / Indicators Stabbing  "stretched out"   Aggravating Factors  walking, bending    Pain Relieving Factors rest            [facility] PT Assessment - 10/18/16 0001      Assessment   Medical Diagnosis s/p R knee arthroscopy with partial med menisectomy and lat subchondroplasty  insuff fx R tibia, derangement of post horn med meniscus   Referring Provider Maryan Rued, MD   Onset Date/Surgical Date 10/10/16   Hand Dominance Right   Next MD Visit 10/25/16   Prior Therapy no     Precautions   Precautions Other (comment)  Blindness     Restrictions   Other Position/Activity Restrictions weight bearing to tolerance, per daughter     Home Environment   Living Environment Private residence   Living Arrangements Spouse/significant other;Children   Additional Comments 2 steps to enter, hand rails available     Prior Function   Level of Independence Independent     Cognition   Overall Cognitive Status Within Functional Limits for tasks assessed     Sensation   Additional Comments WFL     ROM / Strength   AROM / PROM / Strength AROM;Strength     AROM  AROM Assessment Site Knee   Right/Left Knee Right;Left   Right Knee Flexion 121   Left Knee Flexion 100     Strength   Strength Assessment Site Knee   Right/Left Knee Right;Left   Right Knee Extension 3-/5     Palpation   Palpation comment knee is TTP     Ambulation/Gait   Assistive device Rolling walker                   [facility] Adult PT Treatment/Exercise - 10/18/16 0001      Exercises   Exercises Knee/Hip     Knee/Hip Exercises: Stretches   Knee: Self-Stretch Limitations knee flexion/ext with strap     Knee/Hip Exercises: Supine   Quad Sets 20 reps  heavy cuing, edu to daughter for aid   Straight Leg Raises 10 reps  heavy cuing                PT Education - 10/18/16 1319    Education provided  Yes   Education Details anatomy of condition, POC, HEP, exercise form/rationale   Person(s) Educated Patient;Spouse;Child(ren)  via interpreter   Methods Explanation;Demonstration;Tactile cues;Verbal cues;Handout   Comprehension Verbalized understanding;Returned demonstration;Verbal cues required;Tactile cues required;Need further instruction          PT Short Term Goals - 10/18/16 1324      PT SHORT TERM GOAL #1   Title Pt will demo at least 5 SLR without rest break to indicate improving quad strength by 1/12   Baseline unable at eval   Time 2   Period Weeks   Status New     PT SHORT TERM GOAL #2   Title Pt will be able to perform standing weight shift without UE support, without knee buckling    Baseline majority of weight through walker at eval   Time 2   Period Weeks   Status New           PT Long Term Goals - 10/18/16 1326      PT LONG TERM GOAL #1   Title Pt will be able to ambulate at least 200 ft without use of rolling walker and without rest break to indicate improving functional mobility by 2/9   Baseline uses RW full time at eval   Time 6   Period Weeks   Status New     PT LONG TERM GOAL #2   Title Pt will be able to climb 2 steps with use of UE to navigate home safely   Baseline unable at eval   Time 6   Period Weeks   Status New     PT LONG TERM GOAL #3   Title Pt will demo 5/5 MMT knee flx and ext to indicate proper muscular support to knee joint.    Baseline see flowsheet, severe pain to test at eval   Time 6   Period Weeks   Status New     PT LONG TERM GOAL #4   Title Pt will demo equal knee flexion R to L to improve functional ROM and movement   Baseline see flowsheet   Time 6   Period Weeks   Status New               Plan - 10/18/16 1321    Clinical Impression Statement Pt presents to PT with complaints of R knee pain and difficulty walking following surgical intervention to R knee. Pt is blind and was accompanied by her  daughter and  husband, she prefers her daughter to help rather than the interpreter (who was present). Husband wants wife to be off of walker. I told them that once she can easily perform 10 SLR without quad lag, then she can begin walking without walker. Pt had mild edema in knee with no signs of infection. Pt will benefit from skilled PT to improve LE strength and knee ROM to return to PLOF.  CPT code [id number].    Rehab Potential Good   PT Frequency 2x / week   PT Treatment/Interventions ADLs/Self Care Home Management;Cryotherapy;Electrical Stimulation;Functional mobility training;Stair training;Gait training;Moist Heat;Therapeutic activities;Therapeutic exercise;Balance training;Neuromuscular re-education;Patient/family education;Passive range of motion;Scar mobilization;Manual techniques;Taping   PT Next Visit Plan bike, quad strengthening, flexion ROM   PT Home Exercise Plan heel slides, quad set to SLR   Consulted and Agree with Plan of Care Patient;Family member/caregiver   Family Member Consulted Daughter, Husband      Patient will benefit from skilled therapeutic intervention in order to improve the following deficits and impairments:  Abnormal gait, Decreased range of motion, Difficulty walking, Decreased activity tolerance, Pain, Improper body mechanics, Decreased mobility, Decreased strength  Visit Diagnosis: Insufficiency fracture of tibia, initial encounter - Plan: PT plan of care cert/re-cert  Derangement of posterior horn of medial meniscus, right - Plan: PT plan of care cert/re-cert  Acute pain of right knee - Plan: PT plan of care cert/re-cert  Stiffness of right knee, not elsewhere classified - Plan: PT plan of care cert/re-cert  Difficulty in walking, not elsewhere classified - Plan: PT plan of care cert/re-cert     Problem List Patient Active Problem List   Diagnosis Date Noted  . Medial meniscus tear 10/10/2016  . Insufficiency fracture of tibia 10/10/2016  .  Hereditary and idiopathic peripheral neuropathy 01/25/2016  . B12 deficiency 01/25/2016  . Pre-diabetes 01/25/2016  . Syncope 10/31/2015  . Helicobacter pylori gastritis 11/20/2014  . Chest pain, atypical 08/16/2014  . SOB (shortness of breath) 08/16/2014  . Obese 08/16/2014  . Pseudotumor cerebri 08/16/2014  . Blindness 08/16/2014  . Chest pain 08/16/2014  . GERD (gastroesophageal reflux disease) 08/16/2014    Donya Tomaro C. Julius Matus PT, DPT 10/18/16 1:38 PM   [facility] [address] [place], [place], [address] Phone: [phone]   Fax:  [phone]  Name: Janice Hanson MRN: [id number] Date of Birth: [birth year]

## 2016-10-26 ENCOUNTER — Ambulatory Visit: Payer: Medicaid Other | Attending: Orthopedic Surgery | Admitting: Physical Therapy

## 2016-10-26 ENCOUNTER — Telehealth: Payer: Self-pay | Admitting: Physical Therapy

## 2016-10-26 DIAGNOSIS — M25661 Stiffness of right knee, not elsewhere classified: Secondary | ICD-10-CM | POA: Diagnosis present

## 2016-10-26 DIAGNOSIS — M25561 Pain in right knee: Secondary | ICD-10-CM | POA: Diagnosis present

## 2016-10-26 DIAGNOSIS — M23321 Other meniscus derangements, posterior horn of medial meniscus, right knee: Secondary | ICD-10-CM | POA: Insufficient documentation

## 2016-10-26 DIAGNOSIS — M84469A Pathological fracture, unspecified tibia and fibula, initial encounter for fracture: Secondary | ICD-10-CM | POA: Diagnosis present

## 2016-10-26 DIAGNOSIS — R262 Difficulty in walking, not elsewhere classified: Secondary | ICD-10-CM | POA: Diagnosis present

## 2016-10-26 NOTE — Telephone Encounter (Signed)
Left message with husband via interpreter after no show appointment. Left next appointment date and time on voicemail

## 2016-10-26 NOTE — Therapy (Signed)
[facility] [address] [place], [place], [address] Phone: [phone]   Fax:  [phone]  Physical Therapy Treatment  Patient Details  Name: Janice Hanson MRN: [id number] Date of Birth: 10-Dec-1965 Referring Provider: Maryan Rued, MD  Encounter Date: 10/26/2016      PT End of Session - 10/26/16 1308    Visit Number 2   Number of Visits 4   Date for PT Re-Evaluation 12/02/16   Authorization Type Medicaid    Authorization Time Period 10/26/2016-12/20/2016   Authorization - Visit Number 2   Authorization - Number of Visits 4   PT Start Time 1045  Pt arrived 30 minutes late    PT Stop Time 1106   PT Time Calculation (min) 21 min      Past Medical History:  Diagnosis Date  . Abnormal finding in CSF   . Arthritis   . Blindness - both eyes    can see shadows w/ left eye  . GERD (gastroesophageal reflux disease)   . Helicobacter pylori gastritis 11/20/2014  . Hypothyroidism    pt denies  . Kidney stones   . Low blood pressure   . Obesity   . Pseudomembranous colitis    hx of  . Pseudotumor cerebri    followed by Dr. Lucia Gaskins @ [facility]  . Right knee meniscal tear    w/ lateral plateau stress fx  . Syncope 10/31/2015    Past Surgical History:  Procedure Laterality Date  . KNEE ARTHROSCOPY Right 10/10/2016   Procedure: ARTHROSCOPY KNEE WITH PARTIAL MEDIAL MENISECTOMY AND LATERAL SUBCHONDROPLASTY;  Surgeon: Yolonda Kida, MD;  Location: [facility];  Service: Orthopedics;  Laterality: Right;  . TONSILLECTOMY      There were no vitals filed for this visit.      Subjective Assessment - 10/26/16 1047    Subjective Just a little pain   Currently in Pain? Yes   Pain Score 3    Pain Location Knee   Pain Orientation Right   Pain Descriptors / Indicators Sore            [facility] PT Assessment - 10/26/16 0001      AROM   Right Knee Flexion 90  supine                      [facility] Adult PT Treatment/Exercise - 10/26/16 0001      Ambulation/Gait   Assistive device Rolling walker   Gait Pattern Step-to pattern   Gait Comments able to increase heel stike and improve step through pattern with cues      Knee/Hip Exercises: Standing   Other Standing Knee Exercises weight shifting x 5 right/left, then forward/back, heavy cues      Knee/Hip Exercises: Seated   Long Arc Quad 10 reps   Marching Limitations x10      Knee/Hip Exercises: Supine   Quad Sets 10 reps   Quad Sets Limitations mod cues   Short Arc Quad Sets 10 reps   Heel Slides 10 reps   Straight Leg Raises 10 reps  heavy cuing   Straight Leg Raises Limitations small ROM                  PT Short Term Goals - 10/18/16 1324      PT SHORT TERM GOAL #1   Title Pt will demo at least 5 SLR without rest break to indicate improving quad strength by 1/12  Baseline unable at eval   Time 2   Period Weeks   Status New     PT SHORT TERM GOAL #2   Title Pt will be able to perform standing weight shift without UE support, without knee buckling    Baseline majority of weight through walker at eval   Time 2   Period Weeks   Status New           PT Long Term Goals - 10/18/16 1326      PT LONG TERM GOAL #1   Title Pt will be able to ambulate at least 200 ft without use of rolling walker and without rest break to indicate improving functional mobility by 2/9   Baseline uses RW full time at eval   Time 6   Period Weeks   Status New     PT LONG TERM GOAL #2   Title Pt will be able to climb 2 steps with use of UE to navigate home safely   Baseline unable at eval   Time 6   Period Weeks   Status New     PT LONG TERM GOAL #3   Title Pt will demo 5/5 MMT knee flx and ext to indicate proper muscular support to knee joint.    Baseline see flowsheet, severe pain to test at eval   Time 6   Period Weeks   Status New     PT LONG TERM GOAL #4   Title Pt  will demo equal knee flexion R to L to improve functional ROM and movement   Baseline see flowsheet   Time 6   Period Weeks   Status New               Plan - 10/26/16 1257    Clinical Impression Statement Pt arrived 30 minutes late for treatment accompanied by her husband. They agreed to shortened treatment time rather than reschedule. Pt reports she has not done her HEP out of fear of pain. In sitting she demonstrates 3-/5 right hip flexion strength. Asked her to begin marching at home for HEP. In supine she is able to perfrom small staight leg raises without quad lag. Attempted weight shifting with most difficulty putting weight onto leg during forward step. Gait trained with RW encouraging heel strike and bigger steps. Pt required mod cues throughout. Reprinted entire HEP per husband request. Reinforced the need for consistent HEP and reminded them of limited number of covered visits by insurance. Pt has 2 more covered treatments.    PT Next Visit Plan bike, quad strengthening, hip flexion strengthening , flexion ROM   PT Home Exercise Plan heel slides, quad set to SLR, Marching, LAQ    Consulted and Agree with Plan of Care Patient;Family member/caregiver   Family Member Consulted husband       Patient will benefit from skilled therapeutic intervention in order to improve the following deficits and impairments:  Abnormal gait, Decreased range of motion, Difficulty walking, Decreased activity tolerance, Pain, Improper body mechanics, Decreased mobility, Decreased strength  Visit Diagnosis: Insufficiency fracture of tibia, initial encounter  Derangement of posterior horn of medial meniscus, right  Acute pain of right knee  Stiffness of right knee, not elsewhere classified  Difficulty in walking, not elsewhere classified     Problem List Patient Active Problem List   Diagnosis Date Noted  . Medial meniscus tear 10/10/2016  . Insufficiency fracture of tibia 10/10/2016  .  Hereditary and idiopathic peripheral neuropathy 01/25/2016  .  B12 deficiency 01/25/2016  . Pre-diabetes 01/25/2016  . Syncope 10/31/2015  . Helicobacter pylori gastritis 11/20/2014  . Chest pain, atypical 08/16/2014  . SOB (shortness of breath) 08/16/2014  . Obese 08/16/2014  . Pseudotumor cerebri 08/16/2014  . Blindness 08/16/2014  . Chest pain 08/16/2014  . GERD (gastroesophageal reflux disease) 08/16/2014    Sherrie Mustache, PTA 10/26/2016, 1:12 PM  [facility] [address] [place], [place], [address] Phone: [phone]   Fax:  [phone]  Name: Janice Hanson MRN: [id number] Date of Birth: [birth year]

## 2016-11-02 ENCOUNTER — Ambulatory Visit: Payer: Medicaid Other | Admitting: Physical Therapy

## 2016-11-02 ENCOUNTER — Encounter: Payer: Self-pay | Admitting: Physical Therapy

## 2016-11-02 DIAGNOSIS — M25661 Stiffness of right knee, not elsewhere classified: Secondary | ICD-10-CM

## 2016-11-02 DIAGNOSIS — M25561 Pain in right knee: Secondary | ICD-10-CM

## 2016-11-02 DIAGNOSIS — R262 Difficulty in walking, not elsewhere classified: Secondary | ICD-10-CM

## 2016-11-02 DIAGNOSIS — M84469A Pathological fracture, unspecified tibia and fibula, initial encounter for fracture: Secondary | ICD-10-CM | POA: Diagnosis not present

## 2016-11-02 NOTE — Therapy (Addendum)
[place], [place], [address] Phone: [phone]   Fax:  [phone]  Physical Therapy Treatment/Discharge Summary  Patient Details  Name: Janice Hanson MRN: [id number] Date of Birth: 12-14-1965 Referring Provider: Geralynn Rile, MD  Encounter Date: 11/02/2016      PT End of Session - 11/02/16 1103    Visit Number 3   Number of Visits 4   Date for PT Re-Evaluation 12/02/16   Authorization Type Medicaid    Authorization Time Period 10/26/2016-12/20/2016   Authorization - Visit Number 3   Authorization - Number of Visits 4   PT Start Time 1103   PT Stop Time 1150   PT Time Calculation (min) 47 min   Activity Tolerance Patient tolerated treatment well   Behavior During Therapy [facility] for tasks assessed/performed      Past Medical History:  Diagnosis Date  . Abnormal finding in CSF   . Arthritis   . Blindness - both eyes    can see shadows w/ left eye  . GERD (gastroesophageal reflux disease)   . Helicobacter pylori gastritis 11/20/2014  . Hypothyroidism    pt denies  . Kidney stones   . Low blood pressure   . Obesity   . Pseudomembranous colitis    hx of  . Pseudotumor cerebri    followed by Dr. Jaynee Eagles @ [facility]  . Right knee meniscal tear    w/ lateral plateau stress fx  . Syncope 10/31/2015    Past Surgical History:  Procedure Laterality Date  . KNEE ARTHROSCOPY Right 10/10/2016   Procedure: ARTHROSCOPY KNEE WITH PARTIAL MEDIAL MENISECTOMY AND LATERAL SUBCHONDROPLASTY;  Surgeon: Nicholes Stairs, MD;  Location: [facility];  Service: Orthopedics;  Laterality: Right;  . TONSILLECTOMY      There were no vitals filed for this visit.      Subjective Assessment - 11/02/16 1107    Subjective Reports leg feels better than before. Has been off of the walker "almost 6 days"   Feels very tired when walking, cannot feel the R side of her thigh.    Currently in Pain?  Yes   Pain Location Knee   Pain Orientation Right                         [facility] Adult PT Treatment/Exercise - 11/02/16 0001      Knee/Hip Exercises: Aerobic   Nustep L4 5 min     Knee/Hip Exercises: Standing   Heel Raises 15 reps   Other Standing Knee Exercises church pew   Other Standing Knee Exercises weight shift fwd/back, R/L     Knee/Hip Exercises: Seated   Long Arc Quad 10 reps   Heel Slides Limitations foot on towel, use of toe flexion for towel grip   Other Seated Knee/Hip Exercises toe curls   Marching Limitations x10      Knee/Hip Exercises: Supine   Bridges with Ball Squeeze 10 reps   Straight Leg Raises 10 reps   Straight Leg Raises Limitations cues for slow movement     Knee/Hip Exercises: Sidelying   Hip ABduction 10 reps;Right     Modalities   Modalities Cryotherapy     Cryotherapy   Number Minutes Cryotherapy 10 Minutes  2 min concurrent with HEP education   Cryotherapy Location Knee   Type of Cryotherapy Ice pack  PT Education - 11/02/16 1149    Education provided Yes   Education Details exercise form/rationale, HEP   Person(s) Educated Patient;Spouse   Methods Explanation;Demonstration;Tactile cues;Handout;Verbal cues   Comprehension Verbalized understanding;Returned demonstration;Verbal cues required;Tactile cues required;Need further instruction          PT Short Term Goals - 10/18/16 1324      PT SHORT TERM GOAL #1   Title Pt will demo at least 5 SLR without rest break to indicate improving quad strength by 1/12   Baseline unable at eval   Time 2   Period Weeks   Status New     PT SHORT TERM GOAL #2   Title Pt will be able to perform standing weight shift without UE support, without knee buckling    Baseline majority of weight through walker at eval   Time 2   Period Weeks   Status New           PT Long Term Goals - 10/18/16 1326      PT LONG TERM GOAL #1   Title Pt will be able to  ambulate at least 200 ft without use of rolling walker and without rest break to indicate improving functional mobility by 2/9   Baseline uses RW full time at eval   Time 6   Period Weeks   Status New     PT LONG TERM GOAL #2   Title Pt will be able to climb 2 steps with use of UE to navigate home safely   Baseline unable at eval   Time 6   Period Weeks   Status New     PT LONG TERM GOAL #3   Title Pt will demo 5/5 MMT knee flx and ext to indicate proper muscular support to knee joint.    Baseline see flowsheet, severe pain to test at eval   Time 6   Period Weeks   Status New     PT LONG TERM GOAL #4   Title Pt will demo equal knee flexion R to L to improve functional ROM and movement   Baseline see flowsheet   Time 6   Period Weeks   Status New               Plan - 11/02/16 1147    Clinical Impression Statement Pt tolerated exercises well wiht minimal complaints of discomfort. Pt is hesitant to try exercises due to fear but did well with encouragement. Able to ambulate with hand hold assist by husband but presents with slow, antalgic gait.    PT Next Visit Plan d/c   PT Home Exercise Plan heel slides, quad set to SLR, Marching, LAQ, standing weight shift, sidelying hip abduction, bridges   Consulted and Agree with Plan of Care Patient;Family member/caregiver   Family Member Consulted husband       Patient will benefit from skilled therapeutic intervention in order to improve the following deficits and impairments:     Visit Diagnosis: Acute pain of right knee  Stiffness of right knee, not elsewhere classified  Difficulty in walking, not elsewhere classified     Problem List Patient Active Problem List   Diagnosis Date Noted  . Medial meniscus tear 10/10/2016  . Insufficiency fracture of tibia 10/10/2016  . Hereditary and idiopathic peripheral neuropathy 01/25/2016  . B12 deficiency 01/25/2016  . Pre-diabetes 01/25/2016  . Syncope 10/31/2015  .  Helicobacter pylori gastritis 11/20/2014  . Chest pain, atypical 08/16/2014  . SOB (shortness of breath)  08/16/2014  . Obese 08/16/2014  . Pseudotumor cerebri 08/16/2014  . Blindness 08/16/2014  . Chest pain 08/16/2014  . GERD (gastroesophageal reflux disease) 08/16/2014   Jessica C. Hightower PT, DPT 11/02/16 11:51 AM   [facility] [address] [place], [place], [address] Phone: [phone]   Fax:  [phone]  Name: Janice Hanson MRN: [id number] Date of Birth: January 23, 1966  PHYSICAL THERAPY DISCHARGE SUMMARY  Visits from Start of Care: 3  Current functional level related to goals / functional outcomes: See above   Remaining deficits: See above   Education / Equipment: Anatomy of condition, POC, HEP, exercise form/rationale  Plan: Patient agrees to discharge.  Patient goals were not met. Patient is being discharged due to not returning since the last visit.  ?????     Jessica C. Hightower PT, DPT 12/27/16 9:34 AM

## 2016-11-09 ENCOUNTER — Ambulatory Visit: Payer: Medicaid Other | Admitting: Physical Therapy

## 2017-01-04 ENCOUNTER — Encounter (HOSPITAL_COMMUNITY): Payer: Self-pay | Admitting: Emergency Medicine

## 2017-01-04 ENCOUNTER — Emergency Department (HOSPITAL_COMMUNITY): Payer: Medicaid Other

## 2017-01-04 ENCOUNTER — Emergency Department (HOSPITAL_COMMUNITY)
Admission: EM | Admit: 2017-01-04 | Discharge: 2017-01-04 | Disposition: A | Discharge status: Home / Self Care | Payer: Medicaid Other | Attending: Emergency Medicine | Admitting: Emergency Medicine

## 2017-01-04 DIAGNOSIS — R072 Precordial pain: Secondary | ICD-10-CM | POA: Diagnosis present

## 2017-01-04 DIAGNOSIS — R0789 Other chest pain: Secondary | ICD-10-CM | POA: Insufficient documentation

## 2017-01-04 LAB — BASIC METABOLIC PANEL
Anion gap: 7 (ref 5–15)
BUN: 7 mg/dL (ref 6–20)
CO2: 26 mmol/L (ref 22–32)
Calcium: 9 mg/dL (ref 8.9–10.3)
Chloride: 105 mmol/L (ref 101–111)
Creatinine, Ser: 0.67 mg/dL (ref 0.44–1.00)
GFR calc Af Amer: >60 mL/min (ref >60)
GFR calc non Af Amer: >60 mL/min (ref >60)
Glucose, Bld: 102 mg/dL — ABNORMAL HIGH (ref 65–99)
Potassium: 3.9 mmol/L (ref 3.5–5.1)
Sodium: 138 mmol/L (ref 135–145)

## 2017-01-04 LAB — CBC
HCT: 43.9 % (ref 36.0–46.0)
Hemoglobin: 14.5 g/dL (ref 12.0–15.0)
MCH: 27.4 pg (ref 26.0–34.0)
MCHC: 33 g/dL (ref 30.0–36.0)
MCV: 83 fL (ref 78.0–100.0)
Platelets: 289 10*3/uL (ref 150–400)
RBC: 5.29 MIL/uL — ABNORMAL HIGH (ref 3.87–5.11)
RDW: 14.1 % (ref 11.5–15.5)
WBC: 7.5 10*3/uL (ref 4.0–10.5)

## 2017-01-04 LAB — TROPONIN I: Troponin I: <0.03 ng/mL (ref <0.03)

## 2017-01-04 LAB — I-STAT TROPONIN, ED: Troponin i, poc: 0 ng/mL (ref 0.00–0.08)

## 2017-01-04 IMAGING — DX DG CHEST 2V
2 series · 2 of 2 positions shown · non-contrast
Comparison: [O0]

CLINICAL DATA: Sudden onset short of breath

EXAM:
CHEST  2 VIEW

[chest pa]
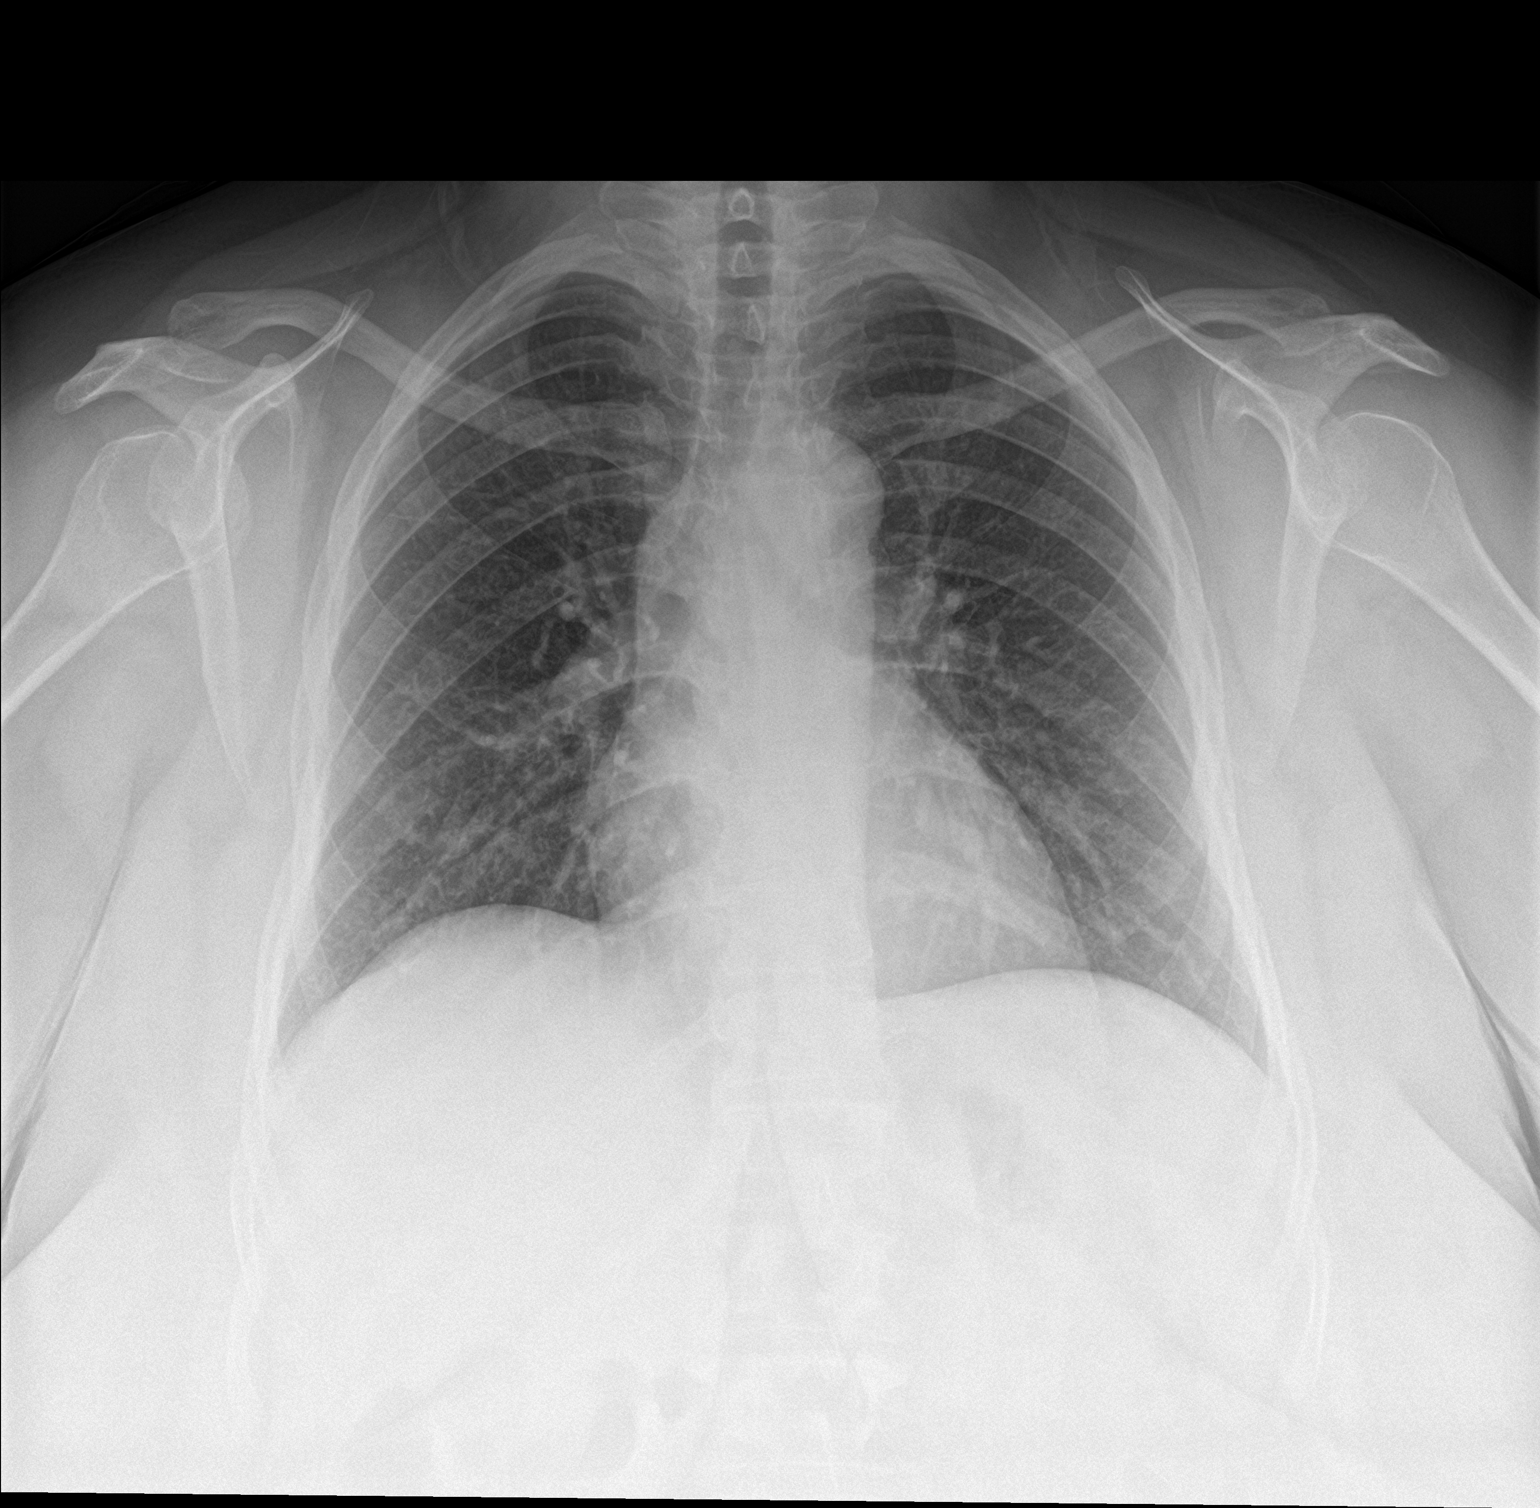

[chest lat]
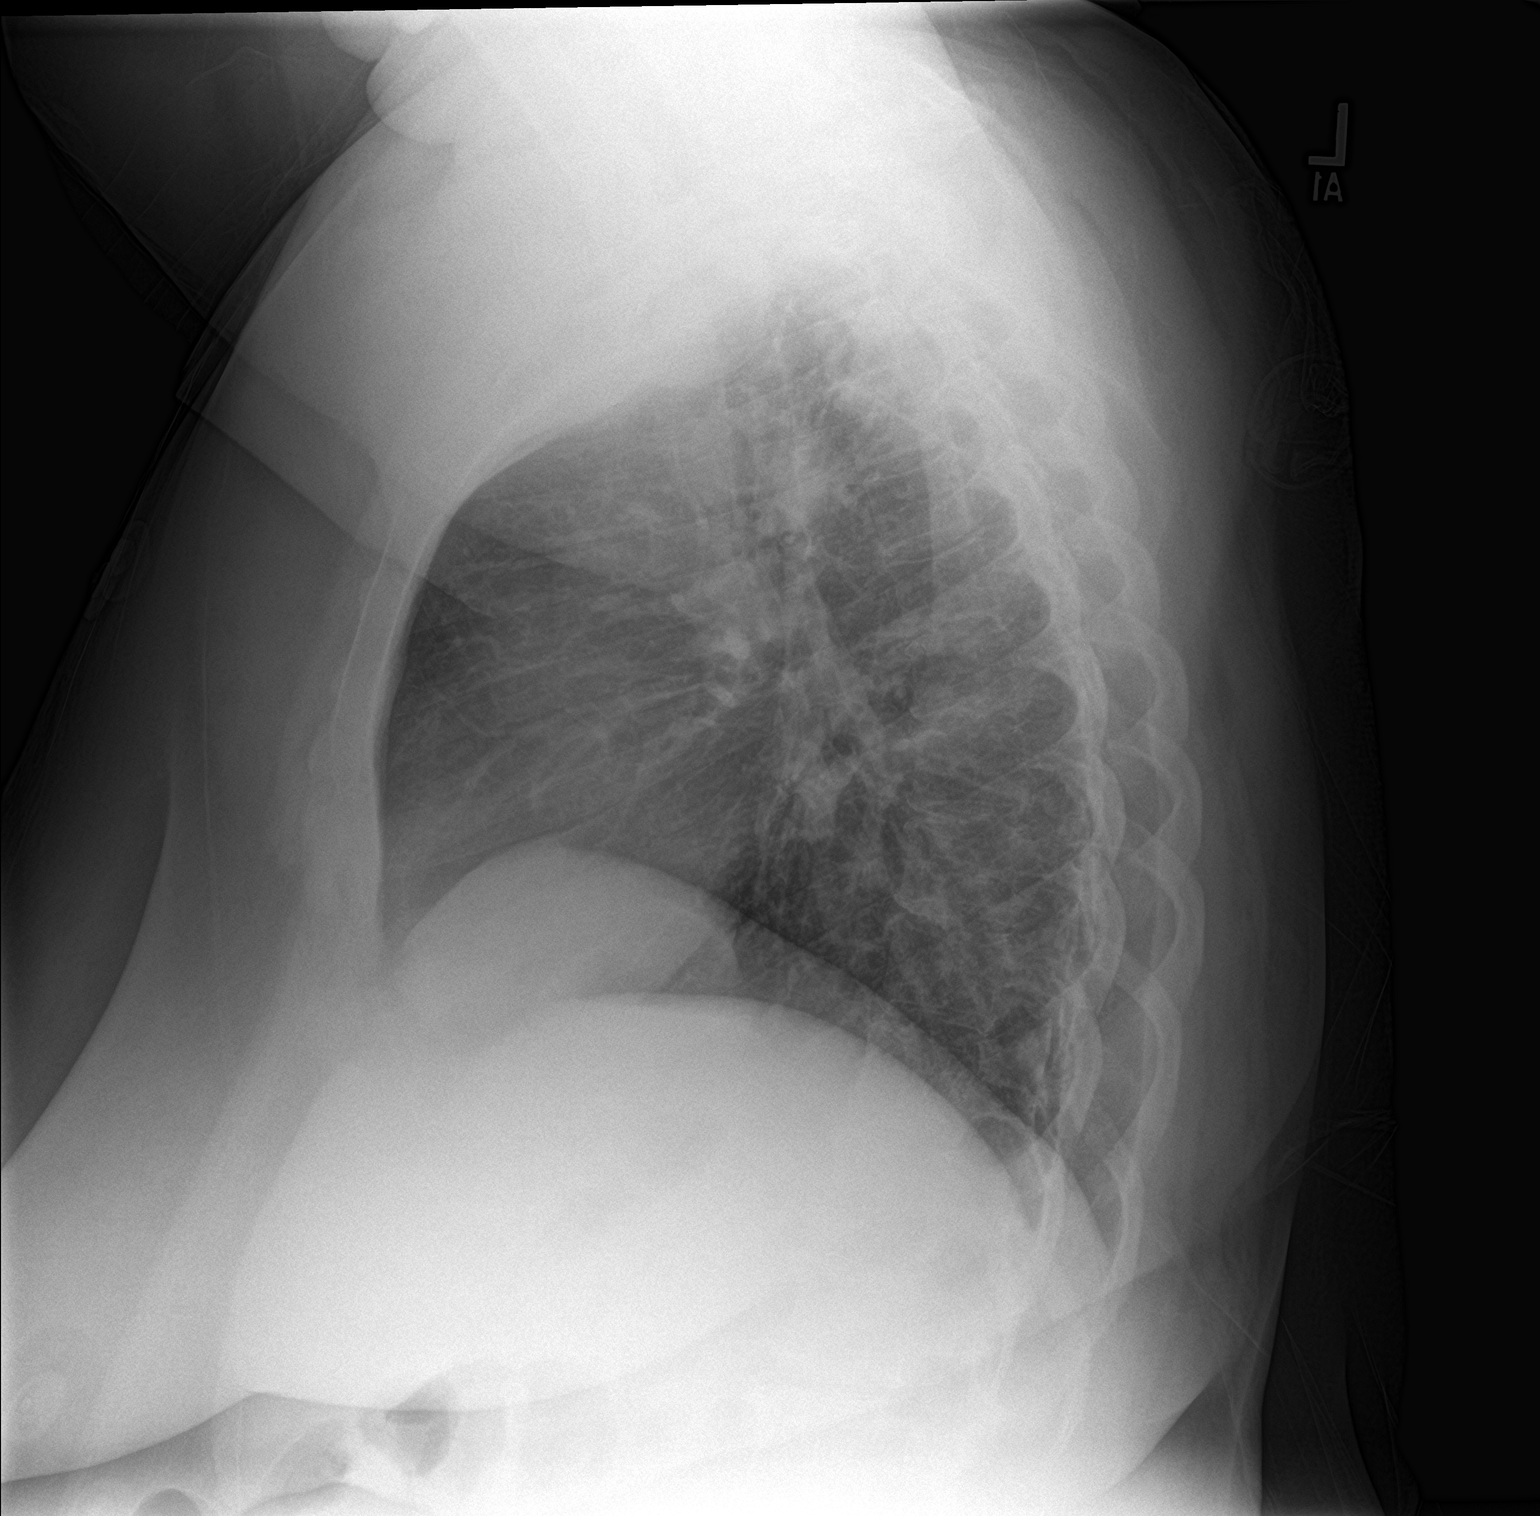

[2 of 2 positions shown; findings below may reference images not displayed]

FINDINGS: Normal mediastinum and cardiac silhouette. Normal pulmonary
vasculature. No evidence of effusion, infiltrate, or pneumothorax.
No acute bony abnormality.
IMPRESSION: Normal chest radiograph

## 2017-01-04 NOTE — ED Triage Notes (Signed)
Pt reports at 3pm she had a sudden onset of central chest pressure that has been constant since. Pt also states she has been sob since the pain started. Pt is warm and dry.

## 2017-01-04 NOTE — ED Notes (Signed)
Pt up to desk asking for update regarding wait time, provided answers to patient's questions and apologized for wait.

## 2017-01-04 NOTE — ED Notes (Signed)
Alcide Cleveraudill, MD at bedside

## 2017-01-04 NOTE — ED Provider Notes (Signed)
[facility]-EMERGENCY DEPT Provider Note   CSN: [id number] Arrival date & time: 01/04/17  1652     History   Chief Complaint Chief Complaint  Patient presents with  . Chest Pain    HPI Janice Hanson is a 51 y.o. female presents for atypical chest pain. Pt was seated watching TV earlier this evening when she experienced 2-3 minutes squeezing, sharp substernal chest pain. Associated dyspnea and nausea. Husband reports labile BP during that time. Self resolved, no medication taken PTA. Husband is concerned as to why her SBP went from 170 to 99.   The history is provided by the spouse and a relative. The history is limited by a language barrier.  Chest Pain   This is a new problem. The current episode started 3 to 5 hours ago. The problem occurs rarely. The problem has been resolved. The pain is associated with rest. The pain is present in the substernal region. The pain is at a severity of 10/10. The pain is severe. The quality of the pain is described as vice-like. The pain does not radiate. Duration of episode(s) is 2 minutes. Associated symptoms include nausea and shortness of breath. Pertinent negatives include no abdominal pain, no back pain, no cough, no exertional chest pressure, no fever, no headaches, no hemoptysis, no leg pain, no lower extremity edema, no malaise/fatigue, no palpitations and no vomiting.    Past Medical History:  Diagnosis Date  . Abnormal finding in CSF   . Arthritis   . Blindness - both eyes    can see shadows w/ left eye  . GERD (gastroesophageal reflux disease)   . Helicobacter pylori gastritis 11/20/2014  . Hypothyroidism    pt denies  . Kidney stones   . Low blood pressure   . Obesity   . Pseudomembranous colitis    hx of  . Pseudotumor cerebri    followed by Dr. Lucia GaskinsAhern @ [facility]  . Right knee meniscal tear    w/ lateral plateau stress fx  . Syncope 10/31/2015    Patient Active Problem List   Diagnosis Date Noted  . Medial meniscus tear  10/10/2016  . Insufficiency fracture of tibia 10/10/2016  . Hereditary and idiopathic peripheral neuropathy 01/25/2016  . B12 deficiency 01/25/2016  . Pre-diabetes 01/25/2016  . Syncope 10/31/2015  . Helicobacter pylori gastritis 11/20/2014  . Chest pain, atypical 08/16/2014  . SOB (shortness of breath) 08/16/2014  . Obese 08/16/2014  . Pseudotumor cerebri 08/16/2014  . Blindness 08/16/2014  . Chest pain 08/16/2014  . GERD (gastroesophageal reflux disease) 08/16/2014    Past Surgical History:  Procedure Laterality Date  . KNEE ARTHROSCOPY Right 10/10/2016   Procedure: ARTHROSCOPY KNEE WITH PARTIAL MEDIAL MENISECTOMY AND LATERAL SUBCHONDROPLASTY;  Surgeon: Yolonda KidaJason Patrick Rogers, MD;  [facility];  Service: Orthopedics;  Laterality: Right;  . TONSILLECTOMY      OB History    No data available       Home Medications    Prior to Admission medications   Medication Sig Start Date End Date Taking? Authorizing Provider  [employer] VITAMIN D3 1000 units capsule Take 1,000 Units by mouth daily. 12/14/15  Yes Historical Provider, MD  ibuprofen (ADVIL,MOTRIN) 800 MG tablet Take 800 mg by mouth every 8 (eight) hours as needed for moderate pain.  12/25/15  Yes Historical Provider, MD  omeprazole (PRILOSEC) 40 MG capsule Take 1 capsule by mouth daily. 09/29/15  Yes Historical Provider, MD  ondansetron (ZOFRAN ODT) 4 MG disintegrating tablet Take 1  tablet (4 mg total) by mouth every 8 (eight) hours as needed for nausea or vomiting. 10/10/16  Yes Yolonda Kida, MD  oxyCODONE (ROXICODONE) 5 MG immediate release tablet Take 1-3 tablets (5-15 mg total) by mouth every 4 (four) hours as needed for severe pain or breakthrough pain. 10/10/16  Yes Yolonda Kida, MD    Family History Family History  Problem Relation Age of Onset  . Diabetes Brother   . Heart disease Paternal Grandfather   . Kidney Stones Maternal Uncle   . Diabetes Maternal Grandmother   . Heart  disease Maternal Grandmother   . Diabetes Sister     Social History Social History  Substance Use Topics  . Smoking status: Never Smoker  . Smokeless tobacco: Never Used  . Alcohol use No     Allergies   Patient has no known allergies.   Review of Systems Review of Systems  Constitutional: Negative for appetite change, fatigue, fever and malaise/fatigue.  Respiratory: Positive for chest tightness and shortness of breath. Negative for cough and hemoptysis.   Cardiovascular: Positive for chest pain. Negative for palpitations and leg swelling.  Gastrointestinal: Positive for nausea. Negative for abdominal pain and vomiting.  Musculoskeletal: Negative for back pain.  Allergic/Immunologic: Negative for immunocompromised state.  Neurological: Negative for headaches.  All other systems reviewed and are negative.    Physical Exam Updated Vital Signs BP 135/65   Pulse 70   Temp 98.2 F (36.8 C) (Oral)   Resp 16   SpO2 99%   Physical Exam  Constitutional: She is oriented to person, place, and time. She appears well-developed and well-nourished. No distress.  HENT:  Head: Normocephalic and atraumatic.  Eyes: Conjunctivae and EOM are normal.  Neck: Normal range of motion. Neck supple.  Cardiovascular: Normal rate, regular rhythm and normal heart sounds.   No murmur heard. Pulmonary/Chest: Effort normal and breath sounds normal. No respiratory distress. She has no wheezes. She exhibits tenderness.  Abdominal: Soft. There is no tenderness.  Musculoskeletal: She exhibits no edema.  Neurological: She is alert and oriented to person, place, and time.  Skin: Skin is warm and dry. Capillary refill takes less than 2 seconds. She is not diaphoretic.  Psychiatric: She has a normal mood and affect.  Nursing note and vitals reviewed.    ED Treatments / Results  Labs (all labs ordered are listed, but only abnormal results are displayed) Labs Reviewed  BASIC METABOLIC PANEL -  Abnormal; Notable for the following:       Result Value   Glucose, Bld 102 (*)    All other components within normal limits  CBC - Abnormal; Notable for the following:    RBC 5.29 (*)    All other components within normal limits  TROPONIN I  I-STAT TROPOININ, ED    EKG  EKG Interpretation  Date/Time:  Wednesday January 04 2017 16:57:12 EDT Ventricular Rate:  77 PR Interval:  142 QRS Duration: 92 QT Interval:  382 QTC Calculation: 432 R Axis:   44 Text Interpretation:  Normal sinus rhythm Low voltage QRS Borderline ECG RSR pattern in v4-v6 and inferior leads  is new Confirmed by Rhunette Croft, MD, Janey Genta [phone]) on 01/04/2017 8:50:20 PM       Radiology Dg Chest 2 View  Result Date: 01/04/2017 CLINICAL DATA:  Sudden onset short of breath EXAM: CHEST  2 VIEW COMPARISON:  10317 FINDINGS: Normal mediastinum and cardiac silhouette. Normal pulmonary vasculature. No evidence of effusion, infiltrate, or pneumothorax. No acute bony  abnormality. IMPRESSION: Normal chest radiograph Electronically Signed   By: Genevive Bi M.D.   On: 01/04/2017 17:24    Procedures Procedures (including critical care time)  Medications Ordered in ED Medications - No data to display   Initial Impression / Assessment and Plan / ED Course  I have reviewed the triage vital signs and the nursing notes.  Pertinent labs & imaging results that were available during my care of the patient were reviewed by me and considered in my medical decision making (see chart for details).    51 y.o. female p/w atypical chest pain. Pain resolved spontaneously PTA. Physical exam unremarkable, VSS, NAD. HEAR score 2, PERC negative. Doubt ACS, PE or dissection. CBC, BMP unremarkable. EKG NSR, no ischemic changes. CXR unremarkable. No indication for further imaging or labs. Unclear etiology, possibly musculoskeletal vs GI. Appropriate for out-patient follow-up. Advised f/u with PCP. Return precautions given. Pt and family voiced  understanding and agreement with plan.   Discussed with my attending physician, Dr Rhunette Croft.  Final Clinical Impressions(s) / ED Diagnoses   Final diagnoses:  Atypical chest pain    New Prescriptions Discharge Medication List as of 01/04/2017 11:02 PM       Pablo Ledger, MD 01/05/17 0045    Derwood Kaplan, MD 01/05/17 [phone]

## 2017-01-24 ENCOUNTER — Ambulatory Visit (INDEPENDENT_AMBULATORY_CARE_PROVIDER_SITE_OTHER): Payer: Medicaid Other | Admitting: Physician Assistant

## 2017-01-24 ENCOUNTER — Other Ambulatory Visit: Payer: Medicaid Other

## 2017-01-24 ENCOUNTER — Encounter: Payer: Self-pay | Admitting: Physician Assistant

## 2017-01-24 VITALS — BP 120/60 | HR 78 | Ht 66.0 in | Wt 285.2 lb

## 2017-01-24 DIAGNOSIS — R12 Heartburn: Secondary | ICD-10-CM

## 2017-01-24 DIAGNOSIS — R1111 Vomiting without nausea: Secondary | ICD-10-CM

## 2017-01-24 DIAGNOSIS — R112 Nausea with vomiting, unspecified: Secondary | ICD-10-CM

## 2017-01-24 NOTE — Patient Instructions (Signed)
You have been scheduled for a gastric emptying scan at [facility] on 02-07-17 at 9:30 am. Please arrive at least 15 minutes prior to your appointment for registration. Please make certain not to have anything to eat or drink after midnight the night before your test. Hold all stomach medications (ex: Omeprazole) 8 hours prior to your test. If you need to reschedule your appointment, please contact radiology scheduling at [phone]. _____________________________________________________________________ A gastric-emptying study measures how long it takes for food to move through your stomach. There are several ways to measure stomach emptying. In the most common test, you eat food that contains a small amount of radioactive material. A scanner that detects the movement of the radioactive material is placed over your abdomen to monitor the rate at which food leaves your stomach. This test normally takes about 4 hours to complete. _____________________________________________________________________  Your physician has requested that you go to the basement following lab work before leaving today.

## 2017-01-24 NOTE — Progress Notes (Addendum)
Chief Complaint: Vomiting  HPI:  Janice Hanson is a 51 year old Arabic speaking female, with a past medical history as listed below, who returns to clinic to discuss continued vomiting .     Patient has previously been followed clinically by Dr. Leone Payor, last seen 11/03/14 for a complaint of reflux and persistent vomiting which was typically postprandial and about once a week. She had an EGD at that time. EGD completed 11/12/14 revealed mild-moderate non-erosive gastritis in body and antrum and was otherwise normal. Biopsies returned showing H. pylori and she was treated with amoxicillin and clarithromycin as well as a PPI. Dr. Leone Payor did make a note that she may need neuro evaluation given history of pseudotumor cerebri.   Today, the patient presents to clinic accompanied by her husband and we do use of video interpreter at the time of her visit. She is a very poor historian and her husband does not help that much. They describe that she has continued with symptoms since being seen 2 years ago. She tells me now that every time she eats or drinks, more than "just a little bit" she will vomit. It does not sound as though she has dysphagia. The food seems to make it to her stomach and then she will vomit it out. She denies nausea in between times. This only happens when she eats. This has been going on increasingly since January 2016. Patient tells me she did the treatment for H. pylori but did not see a benefit from this. She has only occasional heartburn symptoms at this time on Omeprazole 40 mg daily. She does tell me that occasionally she will have constipation but still passes gas on a daily basis.   Patient denies fever, chills, blood in her stool, melena, change in bowel habits, fatigue, anorexia, nausea, reflux or abdominal pain.    Past Medical History:  Diagnosis Date  . Abnormal finding in CSF   . Arthritis   . Blindness - both eyes    can see shadows w/ left eye  . GERD (gastroesophageal  reflux disease)   . Helicobacter pylori gastritis 11/20/2014  . Hypothyroidism    pt denies  . Kidney stones   . Low blood pressure   . Obesity   . Pseudomembranous colitis    hx of  . Pseudotumor cerebri    followed by Dr. Lucia Gaskins @ [facility]  . Right knee meniscal tear    w/ lateral plateau stress fx  . Syncope 10/31/2015    Past Surgical History:  Procedure Laterality Date  . KNEE ARTHROSCOPY Right 10/10/2016   Procedure: ARTHROSCOPY KNEE WITH PARTIAL MEDIAL MENISECTOMY AND LATERAL SUBCHONDROPLASTY;  Surgeon: Yolonda Kida, MD;  Location: [facility];  Service: Orthopedics;  Laterality: Right;  . TONSILLECTOMY      Current Outpatient Prescriptions  Medication Sig Dispense Refill  . omeprazole (PRILOSEC) 40 MG capsule Take 1 capsule by mouth daily.  0   No current facility-administered medications for this visit.     Allergies as of 01/24/2017  . (No Known Allergies)    Family History  Problem Relation Age of Onset  . Diabetes Brother   . Heart disease Paternal Grandfather   . Kidney Stones Maternal Uncle   . Diabetes Maternal Grandmother   . Heart disease Maternal Grandmother   . Diabetes Sister     Social History   Social History  . Marital status: Single    Spouse name: N/A  . Number of children: 4  .  Years of education: N/A   Occupational History  . na    Social History Main Topics  . Smoking status: Never Smoker  . Smokeless tobacco: Never Used  . Alcohol use No  . Drug use: No  . Sexual activity: Not on file   Other Topics Concern  . Not on file   Social History Narrative   Married originally from [place]   4 children 2 girls/2 boys   Caffeine use: 1-2 cups tea daily       Review of Systems:    Constitutional: No fever or chills Skin: No rash  Cardiovascular: No chest pain Respiratory: No SOB Gastrointestinal: See HPI and otherwise negative   Physical Exam:  Vital signs: BP 120/60   Pulse 78   Ht   (1.676 m)   Wt 285 lb 3.2 oz (129.4 kg)   SpO2 98%   BMI 46.03 kg/m   Constitutional:   Pleasant Obese female appears to be in NAD, Well developed, Well nourished, alert and cooperative Head:  Normocephalic and atraumatic. Eyes:   PEERL, EOMI. No icterus. Conjunctiva pink. Ears:  Normal auditory acuity. Neck:  Supple Throat: Oral cavity and pharynx without inflammation, swelling or lesion.  Respiratory: Respirations even and unlabored. Lungs clear to auscultation bilaterally.   No wheezes, crackles, or rhonchi.  Cardiovascular: Normal S1, S2. No MRG. Regular rate and rhythm. No peripheral edema, cyanosis or pallor.  Gastrointestinal:  Soft, nondistended, nontender. No rebound or guarding. Normal bowel sounds. No appreciable masses or hepatomegaly. Rectal:  Not performed.  Msk:  Symmetrical without gross deformities. Without edema, no deformity or joint abnormality.  Neurologic:  Alert and  oriented x4;  grossly normal neurologically.  Skin:   Dry and intact without significant lesions or rashes. Psychiatric: Demonstrates good judgement and reason without abnormal affect or behaviors.  MOST RECENT LABS AND IMAGING: CBC    Component Value Date/Time   WBC 7.5 01/04/2017 1705   RBC 5.29 (H) 01/04/2017 1705   HGB 14.5 01/04/2017 1705   HCT 43.9 01/04/2017 1705   PLT 289 01/04/2017 1705   MCV 83.0 01/04/2017 1705   MCH 27.4 01/04/2017 1705   MCHC 33.0 01/04/2017 1705   RDW 14.1 01/04/2017 1705   LYMPHSABS 2.2 10/02/2015 2338   MONOABS 0.6 10/02/2015 2338   EOSABS 0.6 10/02/2015 2338   BASOSABS 0.0 10/02/2015 2338    CMP     Component Value Date/Time   NA 138 01/04/2017 1705   NA 140 10/07/2015 0955   K 3.9 01/04/2017 1705   CL 105 01/04/2017 1705   CO2 26 01/04/2017 1705   GLUCOSE 102 (H) 01/04/2017 1705   BUN 7 01/04/2017 1705   BUN 11 10/07/2015 0955   CREATININE 0.67 01/04/2017 1705   CALCIUM 9.0 01/04/2017 1705   PROT 7.2 10/07/2015 0955   ALBUMIN 3.6 10/07/2015  0955   AST 10 10/07/2015 0955   ALT 10 10/07/2015 0955   ALKPHOS 98 10/07/2015 0955   BILITOT 0.2 10/07/2015 0955   GFRNONAA >60 01/04/2017 1705   GFRAA >60 01/04/2017 1705    Assessment: 1. Vomiting: Patient had EGD for similar symptoms, though not as frequent in January 2016 with a finding of mild-moderate nonerosive gastritis positive for H. pylori, patient was treated but never improved, in fact she has worsened over the past 2 years, now with vomiting after everything she eats, there was a question about patient's history of pseudotumor cerebri from Dr. Leone Payor after time of last endoscopy question if  this could be related now versus gastroparesis versus gastritis, decreased concern for obstruction as pt is passing gas and stool 2. Heartburn: With above, patient tells me this is not very frequent, in fact only occurs maybe once a week on Omeprazole 40 mg daily  Plan: 1. It sounds as though patient may have an element of gastroparesis. Ordered a gastric emptying study for further evaluation. 2. Her husband is requesting an EGD. Discussed that if above study is negative, we can consider repeat endoscopy for further eval. 3. Ordered an H. pylori fecal antigen. 4. Patient to continue her Omeprazole 40 mg daily 5. Offered an antinausea medicine but the patient tells me she is not nauseous she just vomits when she eats. 6. Patient to follow in clinic with Dr. Leone Payor at his next available appointment  Hyacinth Meeker, PA-C [facility] 01/24/2017, 10:10 AM  Cc: Rometta Emery, MD   Agree with Ms. Lenard Simmer evaluation and management. Iva Boop, MD, Clementeen Graham

## 2017-01-25 LAB — HELICOBACTER PYLORI  SPECIAL ANTIGEN: H. PYLORI Antigen: NOT DETECTED

## 2017-02-01 ENCOUNTER — Telehealth: Payer: Self-pay | Admitting: Physician Assistant

## 2017-02-01 NOTE — Telephone Encounter (Signed)
Pt returning [name]'s call about lab results. States to please call this number [phone]

## 2017-02-02 NOTE — Telephone Encounter (Signed)
Husband came into office and I told him H pylori test was negative. He is very worried about wife. Will see what gastric emptying study says. If unhelpful will likely do an EGD. May need further eval of pseudotumor cerebri also though not clinically evident last MR head and LP opening pressures  Also hope she is taking her B12 - may need another level

## 2017-02-02 NOTE — Telephone Encounter (Signed)
Patient calling in regarding this. Best # [phone]

## 2017-02-07 ENCOUNTER — Ambulatory Visit (HOSPITAL_COMMUNITY)
Admission: RE | Admit: 2017-02-07 | Discharge: 2017-02-07 | Disposition: A | Discharge status: Home / Self Care | Payer: Medicaid Other | Source: Ambulatory Visit | Attending: Physician Assistant | Admitting: Physician Assistant

## 2017-02-07 DIAGNOSIS — R1111 Vomiting without nausea: Secondary | ICD-10-CM | POA: Diagnosis present

## 2017-02-07 DIAGNOSIS — R12 Heartburn: Secondary | ICD-10-CM | POA: Diagnosis present

## 2017-02-07 MED ORDER — TECHNETIUM TC 99M SULFUR COLLOID
2.2000 | Freq: Once | INTRAVENOUS | Status: AC | PRN
  Administered 2017-02-07: 2.2 via ORAL

## 2017-02-07 MED ORDER — TECHNETIUM TC 99M SULFUR COLLOID: 2.2000 | Freq: Once | INTRAVENOUS | Status: DC | PRN

## 2017-02-08 NOTE — Progress Notes (Signed)
Please do.  Looks like May 11 is my earliest unfortunately. Could do Mar 01 729 if the want - can pass this on.

## 2017-02-08 NOTE — Progress Notes (Signed)
EGD next step Please arrange See below for earliest dates

## 2017-02-24 ENCOUNTER — Ambulatory Visit (AMBULATORY_SURGERY_CENTER): Payer: Self-pay

## 2017-02-24 VITALS — Ht 68.0 in | Wt 286.2 lb

## 2017-02-24 DIAGNOSIS — R1111 Vomiting without nausea: Secondary | ICD-10-CM

## 2017-02-24 NOTE — Progress Notes (Signed)
Denies allergies to eggs or soy products. Denies complication of anesthesia or sedation. Denies use of weight loss medication. Denies use of O2.   Emmi instructions given for colonoscopy.  

## 2017-03-03 ENCOUNTER — Ambulatory Visit: Payer: Medicaid Other | Admitting: Internal Medicine

## 2017-03-03 ENCOUNTER — Ambulatory Visit (AMBULATORY_SURGERY_CENTER): Payer: Medicaid Other | Admitting: Internal Medicine

## 2017-03-03 ENCOUNTER — Encounter: Payer: Self-pay | Admitting: Internal Medicine

## 2017-03-03 VITALS — BP 134/81 | HR 61 | Temp 97.1°F | Resp 13 | Ht 68.0 in | Wt 286.0 lb

## 2017-03-03 DIAGNOSIS — R1115 Cyclical vomiting syndrome unrelated to migraine: Secondary | ICD-10-CM

## 2017-03-03 DIAGNOSIS — R1111 Vomiting without nausea: Secondary | ICD-10-CM

## 2017-03-03 MED ORDER — SODIUM CHLORIDE 0.9 % IV SOLN: 500.0000 mL | INTRAVENOUS | Status: DC

## 2017-03-03 MED ORDER — PANTOPRAZOLE SODIUM 40 MG PO TBEC: 40.0000 mg | DELAYED_RELEASE_TABLET | Freq: Every day | ORAL | 3 refills | Status: DC

## 2017-03-03 NOTE — Progress Notes (Signed)
A and O x3. Report to RN. Tolerated MAC anesthesia well.Teeth unchanged after procedure.

## 2017-03-03 NOTE — Patient Instructions (Addendum)
I did not find any problems here. I do not think the vomiting is related to the stomach or other parts of the gastrointestinal tract. It may be related to the neurologic problems - pseudotumor cerebri.  You need to return to Neurology.  I will make a referral back to your neurologist.  I appreciate the opportunity to care for you. Iva Booparl E. Chandlor Noecker, MD, FACG  YOU HAD AN ENDOSCOPIC PROCEDURE TODAY AT THE [facility]:   Refer to the procedure report that was given to you for any specific questions about what was found during the examination.  If the procedure report does not answer your questions, please call your gastroenterologist to clarify.  If you requested that your care partner not be given the details of your procedure findings, then the procedure report has been included in a sealed envelope for you to review at your convenience later.  YOU SHOULD EXPECT: Some feelings of bloating in the abdomen. Passage of more gas than usual.  Walking can help get rid of the air that was put into your GI tract during the procedure and reduce the bloating. If you had a lower endoscopy (such as a colonoscopy or flexible sigmoidoscopy) you may notice spotting of blood in your stool or on the toilet paper. If you underwent a bowel prep for your procedure, you may not have a normal bowel movement for a few days.  Please Note:  You might notice some irritation and congestion in your nose or some drainage.  This is from the oxygen used during your procedure.  There is no need for concern and it should clear up in a day or so.  SYMPTOMS TO REPORT IMMEDIATELY:   Following upper endoscopy (EGD)  Vomiting of blood or coffee ground material  New chest pain or pain under the shoulder blades  Painful or persistently difficult swallowing  New shortness of breath  Fever of 100F or higher  Black, tarry-looking stools  For urgent or emergent issues, a gastroenterologist can be reached at any  hour by calling [phone].   DIET:  We do recommend a small meal at first, but then you may proceed to your regular diet.  Drink plenty of fluids but you should avoid alcoholic beverages for 24 hours.  ACTIVITY:  You should plan to take it easy for the rest of today and you should NOT DRIVE or use heavy machinery until tomorrow (because of the sedation medicines used during the test).    FOLLOW UP: Our staff will call the number listed on your records the next business day following your procedure to check on you and address any questions or concerns that you may have regarding the information given to you following your procedure. If we do not reach you, we will leave a message.  However, if you are feeling well and you are not experiencing any problems, there is no need to return our call.  We will assume that you have returned to your regular daily activities without incident.  If any biopsies were taken you will be contacted by phone or by letter within the next 1-3 weeks.  Please call us at [phone] if you have not heard about the biopsies in 3 weeks.    SIGNATURES/CONFIDENTIALITY: You and/or your care partner have signed paperwork which will be entered into your electronic medical record.  These signatures attest to the fact that that the information above on your After Visit Summary has been reviewed and is  understood.  Full responsibility of the confidentiality of this discharge information lies with you and/or your care-partner. 

## 2017-03-03 NOTE — Op Note (Signed)
[facility] Patient Name: Janice Hanson Procedure Date: 03/03/2017 10:18 AM MRN: [id number] Endoscopist: Iva Booparl E Gessner , MD Age: 51 Referring MD:  Date of Birth: [birth year] Gender: Female Account #: [id number] Procedure:                Upper GI endoscopy Indications:              Persistent vomiting Medicines:                Propofol per Anesthesia, Monitored Anesthesia Care Procedure:                Pre-Anesthesia Assessment:                           - Prior to the procedure, a History and Physical                            was performed, and patient medications and                            allergies were reviewed. The patient's tolerance of                            previous anesthesia was also reviewed. The risks                            and benefits of the procedure and the sedation                            options and risks were discussed with the patient.                            All questions were answered, and informed consent                            was obtained. Prior Anticoagulants: The patient has                            taken no previous anticoagulant or antiplatelet                            agents. ASA Grade Assessment: II - A patient with                            mild systemic disease. After reviewing the risks                            and benefits, the patient was deemed in                            satisfactory condition to undergo the procedure.                           After obtaining informed consent, the endoscope was  passed under direct vision. Throughout the                            procedure, the patient's blood pressure, pulse, and                            oxygen saturations were monitored continuously. The                            Endoscope was introduced through the mouth, and                            advanced to the second part of duodenum. The upper                            GI  endoscopy was accomplished without difficulty.                            The patient tolerated the procedure well. Scope In: Scope Out: Findings:                 The esophagus was normal.                           The stomach was normal.                           The examined duodenum was normal.                           The cardia and gastric fundus were normal on                            retroflexion. Complications:            No immediate complications. Estimated Blood Loss:     Estimated blood loss: none. Impression:               - Normal esophagus.                           - Normal stomach.                           - Normal examined duodenum.                           - No specimens collected. Recommendation:           - Patient has a contact number available for                            emergencies. The signs and symptoms of potential                            delayed complications were discussed with the  patient. Return to normal activities tomorrow.                            Written discharge instructions were provided to the                            patient.                           - Resume previous diet.                           - Continue present medications.                           - Refer to neurology Dr. Lucia Gaskins - has seen in past                            [facility]].                           - re: vomiting, hx pseudotumor cerebri Iva Boop, MD 03/03/2017 10:48:15 AM This report has been signed electronically.

## 2017-03-03 NOTE — Progress Notes (Signed)
Pt's states no medical or surgical changes since previsit. Patient's daughter at side as interpreter. Patient denies any allergies to eggs or soy.

## 2017-03-04 ENCOUNTER — Encounter (HOSPITAL_COMMUNITY): Payer: Self-pay | Admitting: Emergency Medicine

## 2017-03-04 ENCOUNTER — Emergency Department (HOSPITAL_COMMUNITY)
Admission: EM | Admit: 2017-03-04 | Discharge: 2017-03-04 | Disposition: A | Discharge status: Home / Self Care | Payer: Medicaid Other | Attending: Emergency Medicine | Admitting: Emergency Medicine

## 2017-03-04 DIAGNOSIS — E039 Hypothyroidism, unspecified: Secondary | ICD-10-CM | POA: Diagnosis not present

## 2017-03-04 DIAGNOSIS — Z79899 Other long term (current) drug therapy: Secondary | ICD-10-CM | POA: Insufficient documentation

## 2017-03-04 DIAGNOSIS — R6883 Chills (without fever): Secondary | ICD-10-CM

## 2017-03-04 DIAGNOSIS — I10 Essential (primary) hypertension: Secondary | ICD-10-CM | POA: Diagnosis not present

## 2017-03-04 LAB — BASIC METABOLIC PANEL
Anion gap: 8 (ref 5–15)
BUN: 9 mg/dL (ref 6–20)
CO2: 24 mmol/L (ref 22–32)
Calcium: 8.7 mg/dL — ABNORMAL LOW (ref 8.9–10.3)
Chloride: 107 mmol/L (ref 101–111)
Creatinine, Ser: 0.56 mg/dL (ref 0.44–1.00)
GFR calc Af Amer: >60 mL/min (ref >60)
GFR calc non Af Amer: >60 mL/min (ref >60)
Glucose, Bld: 151 mg/dL — ABNORMAL HIGH (ref 65–99)
Potassium: 3.6 mmol/L (ref 3.5–5.1)
Sodium: 139 mmol/L (ref 135–145)

## 2017-03-04 LAB — URINALYSIS, ROUTINE W REFLEX MICROSCOPIC
Bilirubin Urine: NEGATIVE
Glucose, UA: NEGATIVE mg/dL
Hgb urine dipstick: NEGATIVE
Ketones, ur: NEGATIVE mg/dL
Leukocytes, UA: NEGATIVE
Nitrite: NEGATIVE
Protein, ur: NEGATIVE mg/dL
Specific Gravity, Urine: 1.001 — ABNORMAL LOW (ref 1.005–1.030)
pH: 7 (ref 5.0–8.0)

## 2017-03-04 LAB — CBC
HCT: 40.5 % (ref 36.0–46.0)
Hemoglobin: 13.1 g/dL (ref 12.0–15.0)
MCH: 27 pg (ref 26.0–34.0)
MCHC: 32.3 g/dL (ref 30.0–36.0)
MCV: 83.5 fL (ref 78.0–100.0)
Platelets: 270 10*3/uL (ref 150–400)
RBC: 4.85 MIL/uL (ref 3.87–5.11)
RDW: 14 % (ref 11.5–15.5)
WBC: 5.4 10*3/uL (ref 4.0–10.5)

## 2017-03-04 NOTE — ED Notes (Signed)
Bed: WA10 Expected date:  Expected time:  Means of arrival:  Comments: EMS 

## 2017-03-04 NOTE — ED Triage Notes (Signed)
Brought in by PTAR from home with c/o "chills".  Pt has had endoscopy yesterday at [facility] for chronic emesis.   Today, pt has sudden onset of chills which is "very new" to her.  Pt denies fever and abdominal pain.   Pt's temp by PTAR was T97.4 (oral).

## 2017-03-04 NOTE — ED Provider Notes (Signed)
[facility]-EMERGENCY DEPT Provider Note   CSN: [id number] Arrival date & time: 03/04/17  0357     History   Chief Complaint Chief Complaint  Patient presents with  . Emesis  . Chills    HPI Janice Hanson is a 51 y.o. female.  HPI  51 y.o. female with a hx of HTN, HLD, presents to the Emergency Department todayvia PTAR due to "chills" around 0200. Noted that the duration was around 1 hour. No associated pain. Family piled blankets on top of her to help the symptoms, but did not aid in the shaking. Called PTAR. Hx endoscopy yesterday due to chronic emesis, which was unremarkable. Denies fevers. No CP/SOB/ABD pain. No N/V/D. Noted chronic emesis, but none since this AM. Noted lower back pain. No Dysuria. No loss of bowel or bladder function. No saddle anesthesia. No headaches. No visual changes. Pt has not had chills since being in the ED. No other symptoms noted.   Past Medical History:  Diagnosis Date  . Abnormal finding in CSF   . Arthritis   . Blindness - both eyes    can see shadows w/ left eye  . GERD (gastroesophageal reflux disease)   . Helicobacter pylori gastritis 11/20/2014  . Hyperlipidemia   . Hypertension   . Hypothyroidism    pt denies  . Kidney stones   . Low blood pressure   . Obesity   . Pseudomembranous colitis    hx of  . Pseudotumor cerebri    followed by Dr. Lucia Gaskins @ [facility]  . Right knee meniscal tear    w/ lateral plateau stress fx  . Syncope 10/31/2015    Patient Active Problem List   Diagnosis Date Noted  . Medial meniscus tear 10/10/2016  . Insufficiency fracture of tibia 10/10/2016  . Hereditary and idiopathic peripheral neuropathy 01/25/2016  . B12 deficiency 01/25/2016  . Pre-diabetes 01/25/2016  . Syncope 10/31/2015  . Helicobacter pylori gastritis 11/20/2014  . Chest pain, atypical 08/16/2014  . SOB (shortness of breath) 08/16/2014  . Obese 08/16/2014  . Pseudotumor cerebri 08/16/2014  . Blindness 08/16/2014  . Chest pain  08/16/2014  . GERD (gastroesophageal reflux disease) 08/16/2014    Past Surgical History:  Procedure Laterality Date  . KNEE ARTHROSCOPY Right 10/10/2016   Procedure: ARTHROSCOPY KNEE WITH PARTIAL MEDIAL MENISECTOMY AND LATERAL SUBCHONDROPLASTY;  Surgeon: Yolonda Kida, MD;  Location: [facility];  Service: Orthopedics;  Laterality: Right;  . TONSILLECTOMY      OB History    No data available       Home Medications    Prior to Admission medications   Medication Sig Start Date End Date Taking? Authorizing Provider  pantoprazole (PROTONIX) 40 MG tablet Take 1 tablet (40 mg total) by mouth daily. 03/03/17   Iva Boop, MD    Family History Family History  Problem Relation Age of Onset  . Diabetes Brother   . Heart disease Paternal Grandfather   . Kidney Stones Maternal Uncle   . Diabetes Maternal Grandmother   . Heart disease Maternal Grandmother   . Diabetes Sister   . Colon cancer Neg Hx     Social History Social History  Substance Use Topics  . Smoking status: Never Smoker  . Smokeless tobacco: Never Used  . Alcohol use No     Allergies   Patient has no known allergies.   Review of Systems Review of Systems ROS reviewed and all are negative for acute change except as noted in  the HPI.  Physical Exam Updated Vital Signs BP (!) 108/57   Pulse 85   Temp 97.9 F (36.6 C) (Oral)   Resp 20   SpO2 94%   Physical Exam  Constitutional: She is oriented to person, place, and time. Vital signs are normal. She appears well-developed and well-nourished. No distress.  HENT:  Head: Normocephalic and atraumatic.  Right Ear: Hearing, tympanic membrane, external ear and ear canal normal.  Left Ear: Hearing, tympanic membrane, external ear and ear canal normal.  Nose: Nose normal.  Mouth/Throat: Uvula is midline, oropharynx is clear and moist and mucous membranes are normal. No trismus in the jaw. No oropharyngeal exudate, posterior  oropharyngeal erythema or tonsillar abscesses.  Eyes: Conjunctivae and EOM are normal. Pupils are equal, round, and reactive to light.  Neck: Normal range of motion. Neck supple. No tracheal deviation present.  Cardiovascular: Normal rate, regular rhythm, S1 normal, S2 normal, normal heart sounds, intact distal pulses and normal pulses.   Pulmonary/Chest: Effort normal and breath sounds normal. No respiratory distress. She has no decreased breath sounds. She has no wheezes. She has no rhonchi. She has no rales.  Abdominal: Normal appearance and bowel sounds are normal. There is no tenderness. There is no rigidity, no rebound, no guarding, no CVA tenderness, no tenderness at McBurney's point and negative Murphy's sign.  Musculoskeletal: Normal range of motion.  Right lower lumbar musculature TTP. NO midline tenderness   Neurological: She is alert and oriented to person, place, and time.  Skin: Skin is warm and dry.  Psychiatric: She has a normal mood and affect. Her speech is normal and behavior is normal. Thought content normal.  Nursing note and vitals reviewed.  ED Treatments / Results  Labs (all labs ordered are listed, but only abnormal results are displayed) Labs Reviewed  BASIC METABOLIC PANEL - Abnormal; Notable for the following:       Result Value   Glucose, Bld 151 (*)    Calcium 8.7 (*)    All other components within normal limits  URINALYSIS, ROUTINE W REFLEX MICROSCOPIC - Abnormal; Notable for the following:    Color, Urine COLORLESS (*)    Specific Gravity, Urine 1.001 (*)    All other components within normal limits  CBC    EKG  EKG Interpretation None       Radiology No results found.  Procedures Procedures (including critical care time)  Medications Ordered in ED Medications - No data to display   Initial Impression / Assessment and Plan / ED Course  I have reviewed the triage vital signs and the nursing notes.  Pertinent labs & imaging results that  were available during my care of the patient were reviewed by me and considered in my medical decision making (see chart for details).  Final Clinical Impressions(s) / ED Diagnoses  {I have reviewed and evaluated the relevant laboratory values.   {I have reviewed the relevant previous healthcare records.  {I obtained HPI from historian.   ED Course:  Assessment: Pt is a 51 y.o. female with hx HTN, HLD who presents with chills this AM around 0200. Occurred x 1 hour. No CP/SOB/ABD pain. No fevers. No dysuria. Mild back pain. NO headaches. No vision changes. Asymptomatic currently. Hx of endoscopy yesterday that was unremarkable. On exam, pt in NAD. Nontoxic/nonseptic appearing. VSS. Afebrile. Lungs CTA. Heart RRR. Abdomen nontender soft. Labs unremarkable. UA unremarkable. Symptoms possible related to post anesthesia? Asymptomatic currently and has been for several hours. No acute apthology  identified in ED visit. Plan is to DC home with follow up to PCP. Strict return precautions given.. At time of discharge, Patient is in no acute distress. Vital Signs are stable. Patient is able to ambulate. Patient able to tolerate PO.   Disposition/Plan:  DC Home Additional Verbal discharge instructions given and discussed with patient.  Pt Instructed to f/u with PCP in the next week for evaluation and treatment of symptoms. Return precautions given Pt acknowledges and agrees with plan  Supervising Physician Gilda Crease, *  Final diagnoses:  Chills (without fever)    New Prescriptions New Prescriptions   No medications on file     Audry Pili, Cordelia Poche 03/04/17 0719    Gilda Crease, MD 03/05/17 [phone]

## 2017-03-04 NOTE — Discharge Instructions (Signed)
Please read and follow all provided instructions.  Your diagnoses today include:  1. Chills (without fever)     Tests performed today include: Vital signs. See below for your results today.   Medications prescribed:  Take as prescribed   Home care instructions:  Follow any educational materials contained in this packet.  Follow-up instructions: Please follow-up with your primary care provider for further evaluation of symptoms and treatment   Return instructions:  Please return to the Emergency Department if you do not get better, if you get worse, or new symptoms OR  - Fever (temperature greater than 101.48F)  - Bleeding that does not stop with holding pressure to the area    -Severe pain (please note that you may be more sore the day after your accident)  - Chest Pain  - Difficulty breathing  - Severe nausea or vomiting  - Inability to tolerate food and liquids  - Passing out  - Skin becoming red around your wounds  - Change in mental status (confusion or lethargy)  - New numbness or weakness    Please return if you have any other emergent concerns.  Additional Information:  Your vital signs today were: BP (!) 108/57    Pulse 85    Temp 97.9 F (36.6 C) (Oral)    Resp 20    SpO2 94%  If your blood pressure (BP) was elevated above 135/85 this visit, please have this repeated by your doctor within one month. ---------------

## 2017-03-06 ENCOUNTER — Telehealth: Payer: Self-pay | Admitting: *Deleted

## 2017-03-06 ENCOUNTER — Telehealth: Payer: Self-pay

## 2017-03-06 DIAGNOSIS — R112 Nausea with vomiting, unspecified: Secondary | ICD-10-CM

## 2017-03-06 DIAGNOSIS — G932 Benign intracranial hypertension: Secondary | ICD-10-CM

## 2017-03-06 NOTE — Telephone Encounter (Signed)
Patient notified that she will be contacted directly by Dr. Trevor MaceAhern's office [facility] to set up an office visit.

## 2017-03-06 NOTE — Telephone Encounter (Signed)
  Follow up Call-  Call back number 03/03/2017 11/12/2014  Post procedure Call Back phone  # [phone] husband [phone]  Permission to leave phone message Yes Yes  Some recent data might be hidden     Patient questions:  Do you have a fever, pain , or abdominal swelling? No. Pain Score  0 *  Have you tolerated food without any problems? Yes.    Have you been able to return to your normal activities? Yes.    Do you have any questions about your discharge instructions: Diet   No. Medications  No. Follow up visit  No.  Do you have questions or concerns about your Care? No.  Actions: * If pain score is 4 or above: No action needed, pain <4.  Spoke with husband, no problems, no questions.

## 2017-03-24 ENCOUNTER — Other Ambulatory Visit: Payer: Self-pay | Admitting: Internal Medicine

## 2017-03-24 DIAGNOSIS — G932 Benign intracranial hypertension: Secondary | ICD-10-CM

## 2017-03-30 ENCOUNTER — Ambulatory Visit
Admission: RE | Admit: 2017-03-30 | Discharge: 2017-03-30 | Disposition: A | Discharge status: Home / Self Care | Payer: Medicaid Other | Source: Ambulatory Visit | Attending: Internal Medicine | Admitting: Internal Medicine

## 2017-03-30 DIAGNOSIS — G932 Benign intracranial hypertension: Secondary | ICD-10-CM

## 2017-03-30 NOTE — Discharge Instructions (Signed)

## 2017-05-03 ENCOUNTER — Ambulatory Visit: Payer: Medicaid Other | Admitting: Neurology

## 2018-04-02 ENCOUNTER — Other Ambulatory Visit: Payer: Self-pay | Admitting: Internal Medicine

## 2018-04-02 DIAGNOSIS — G932 Benign intracranial hypertension: Secondary | ICD-10-CM

## 2018-04-11 ENCOUNTER — Ambulatory Visit
Admission: RE | Admit: 2018-04-11 | Discharge: 2018-04-11 | Disposition: A | Discharge status: Home / Self Care | Payer: Medicaid Other | Source: Ambulatory Visit | Attending: Internal Medicine | Admitting: Internal Medicine

## 2018-04-11 DIAGNOSIS — G932 Benign intracranial hypertension: Secondary | ICD-10-CM

## 2018-04-11 NOTE — Discharge Instructions (Signed)

## 2018-09-22 ENCOUNTER — Emergency Department (HOSPITAL_COMMUNITY): Payer: Medicaid Other

## 2018-09-22 ENCOUNTER — Encounter (HOSPITAL_COMMUNITY): Payer: Self-pay | Admitting: Emergency Medicine

## 2018-09-22 ENCOUNTER — Emergency Department (HOSPITAL_COMMUNITY)
Admission: EM | Admit: 2018-09-22 | Discharge: 2018-09-22 | Disposition: A | Discharge status: Home / Self Care | Payer: Medicaid Other | Attending: Emergency Medicine | Admitting: Emergency Medicine

## 2018-09-22 DIAGNOSIS — Z79899 Other long term (current) drug therapy: Secondary | ICD-10-CM | POA: Insufficient documentation

## 2018-09-22 DIAGNOSIS — R519 Headache, unspecified: Secondary | ICD-10-CM

## 2018-09-22 DIAGNOSIS — I10 Essential (primary) hypertension: Secondary | ICD-10-CM | POA: Diagnosis not present

## 2018-09-22 DIAGNOSIS — E039 Hypothyroidism, unspecified: Secondary | ICD-10-CM | POA: Insufficient documentation

## 2018-09-22 DIAGNOSIS — R51 Headache: Secondary | ICD-10-CM | POA: Diagnosis present

## 2018-09-22 LAB — URINALYSIS, ROUTINE W REFLEX MICROSCOPIC
Bacteria, UA: NONE SEEN
Bilirubin Urine: NEGATIVE
Glucose, UA: NEGATIVE mg/dL
Ketones, ur: NEGATIVE mg/dL
Leukocytes, UA: NEGATIVE
Nitrite: NEGATIVE
Protein, ur: NEGATIVE mg/dL
Specific Gravity, Urine: 1.008 (ref 1.005–1.030)
pH: 5 (ref 5.0–8.0)

## 2018-09-22 LAB — COMPREHENSIVE METABOLIC PANEL
ALT: 15 U/L (ref 0–44)
AST: 17 U/L (ref 15–41)
Albumin: 3.1 g/dL — ABNORMAL LOW (ref 3.5–5.0)
Alkaline Phosphatase: 80 U/L (ref 38–126)
Anion gap: 5 (ref 5–15)
BUN: 10 mg/dL (ref 6–20)
CO2: 27 mmol/L (ref 22–32)
Calcium: 8.8 mg/dL — ABNORMAL LOW (ref 8.9–10.3)
Chloride: 107 mmol/L (ref 98–111)
Creatinine, Ser: 0.8 mg/dL (ref 0.44–1.00)
GFR calc Af Amer: >60 mL/min (ref >60)
GFR calc non Af Amer: >60 mL/min (ref >60)
Glucose, Bld: 148 mg/dL — ABNORMAL HIGH (ref 70–99)
Potassium: 3.5 mmol/L (ref 3.5–5.1)
Sodium: 139 mmol/L (ref 135–145)
Total Bilirubin: 0.4 mg/dL (ref 0.3–1.2)
Total Protein: 7.4 g/dL (ref 6.5–8.1)

## 2018-09-22 LAB — CBC
HCT: 41.7 % (ref 36.0–46.0)
Hemoglobin: 13.3 g/dL (ref 12.0–15.0)
MCH: 27.3 pg (ref 26.0–34.0)
MCHC: 31.9 g/dL (ref 30.0–36.0)
MCV: 85.6 fL (ref 80.0–100.0)
Platelets: 258 10*3/uL (ref 150–400)
RBC: 4.87 MIL/uL (ref 3.87–5.11)
RDW: 13.2 % (ref 11.5–15.5)
WBC: 6.3 10*3/uL (ref 4.0–10.5)
nRBC: 0 % (ref 0.0–0.2)

## 2018-09-22 LAB — I-STAT BETA HCG BLOOD, ED (MC, WL, AP ONLY): I-stat hCG, quantitative: <5 m[IU]/mL (ref <5)

## 2018-09-22 LAB — LIPASE, BLOOD: Lipase: 44 U/L (ref 11–51)

## 2018-09-22 IMAGING — CT CT HEAD W/O CM
4 series · 15 of 47 positions shown, 17 images · non-contrast
Comparison: Head CT [DATE]

CLINICAL DATA: Headache, acute, normal neuro exam

EXAM:
CT HEAD WITHOUT CONTRAST
TECHNIQUE: Contiguous axial images were obtained from the base of the skull
through the vertex without intravenous contrast.

[Series 3: head wo · axial · 0.45mm/px · z∈[-200,-75]mm · 7 of 35 slices shown, 9 images]
[im 5/35  brain]
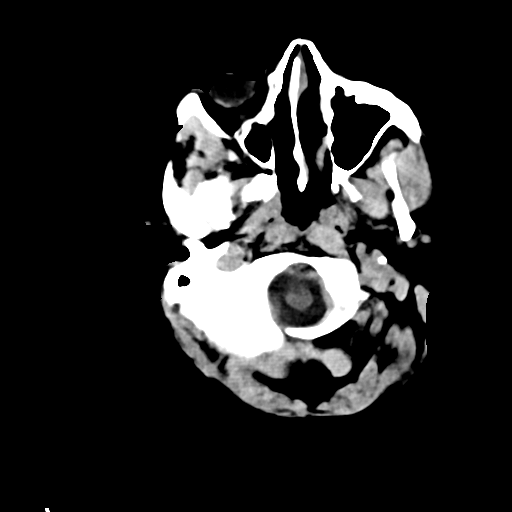
[im 5/35  bone]
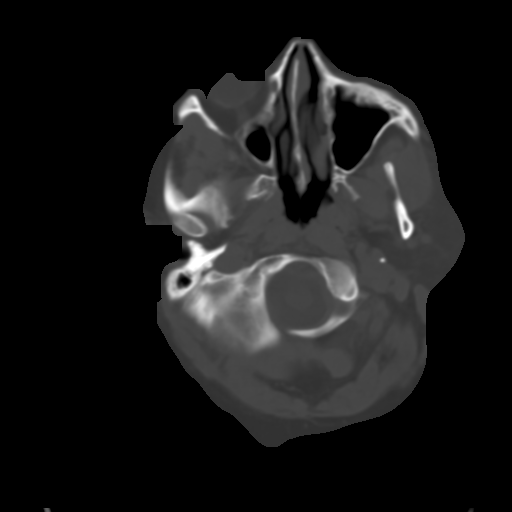
[im 9/35  brain]
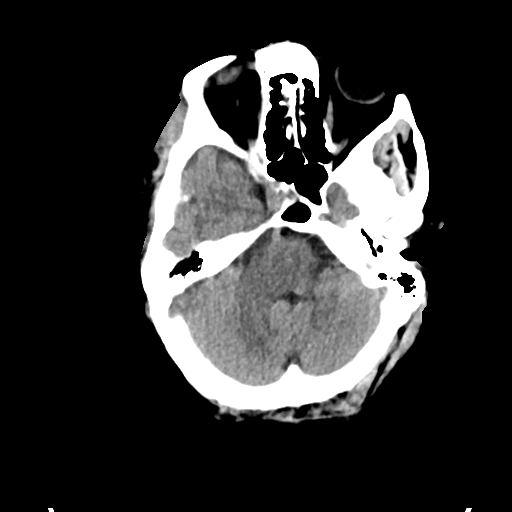
[im 13/35  brain]
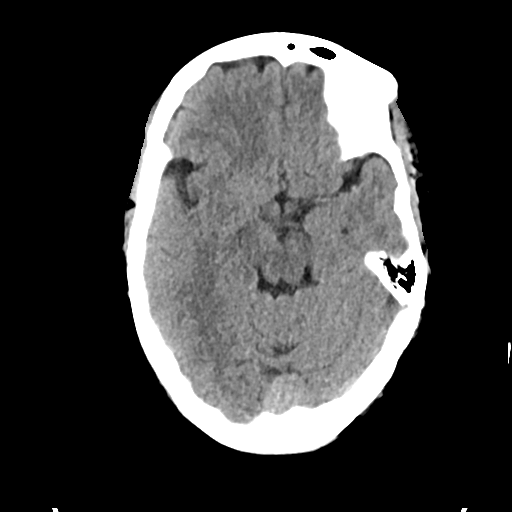
[im 18/35  brain]
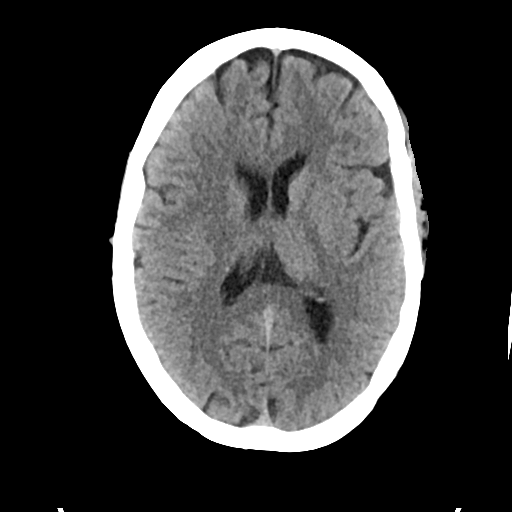
[im 22/35  brain]
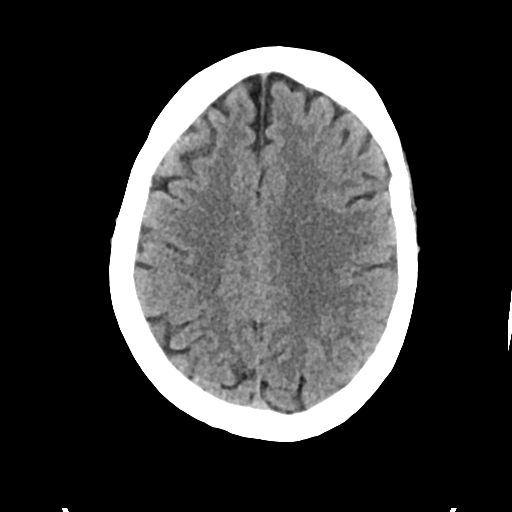
[im 22/35  bone]
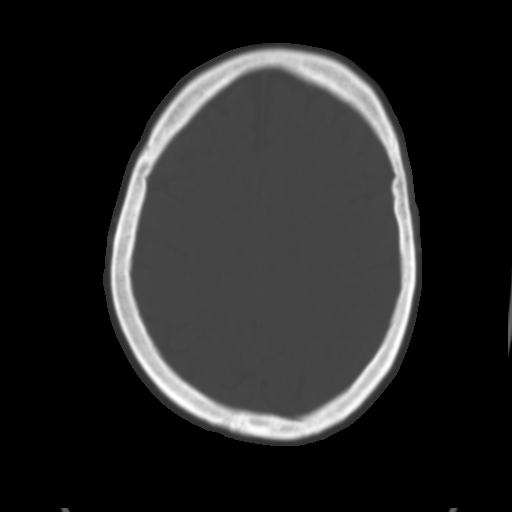
[im 26/35  brain]
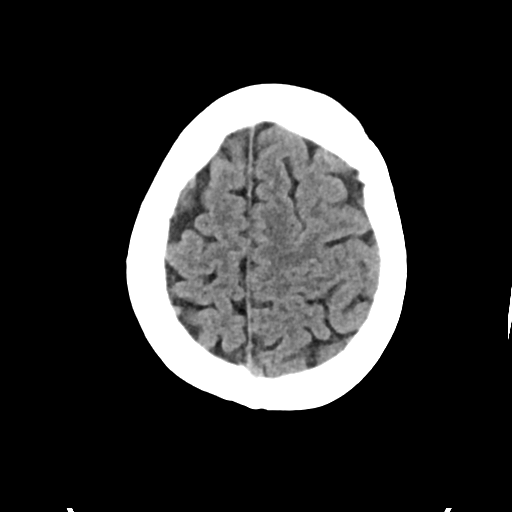
[im 30/35  brain]
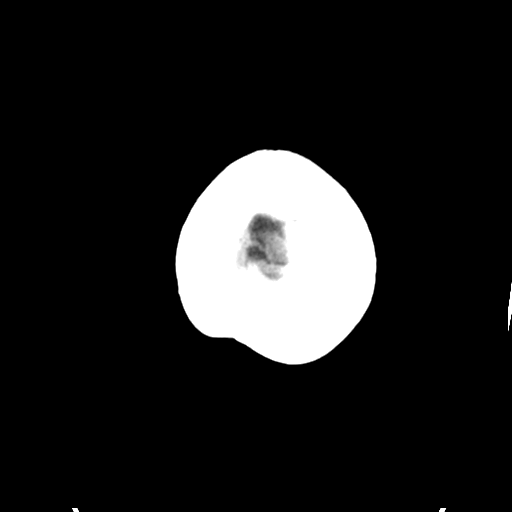

[Series 4: head bone · axial · 0.45mm/px · z∈[-204,-186]mm · 2 of 87 slices shown]
[im 9/87  bone]
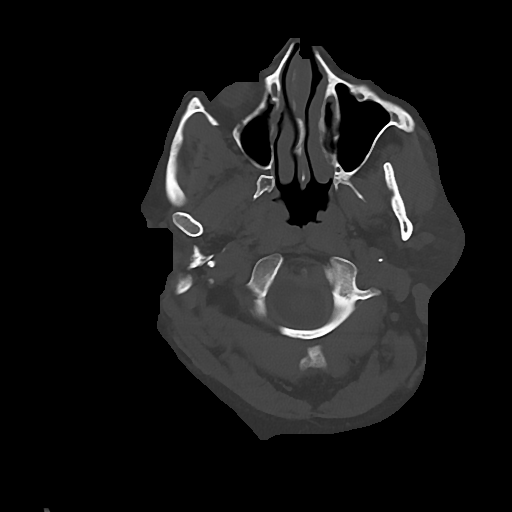
[im 18/87  bone]
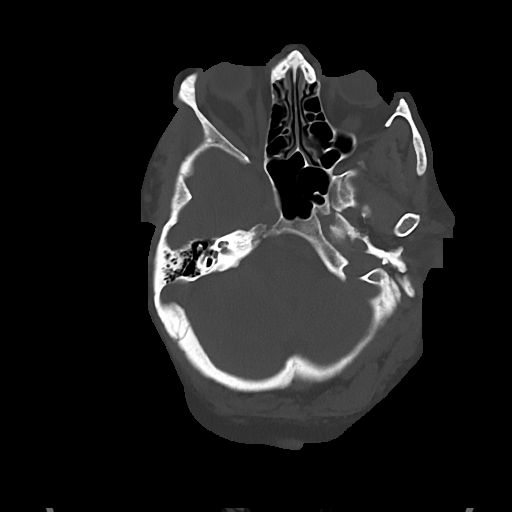

[Series 5: cor soft · coronal · 0.34mm/px · 3 of 72 slices shown]
[im 24/72  brain]
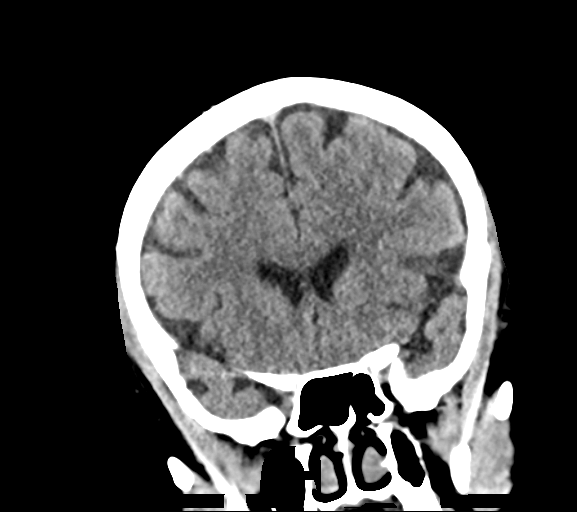
[im 32/72  brain]
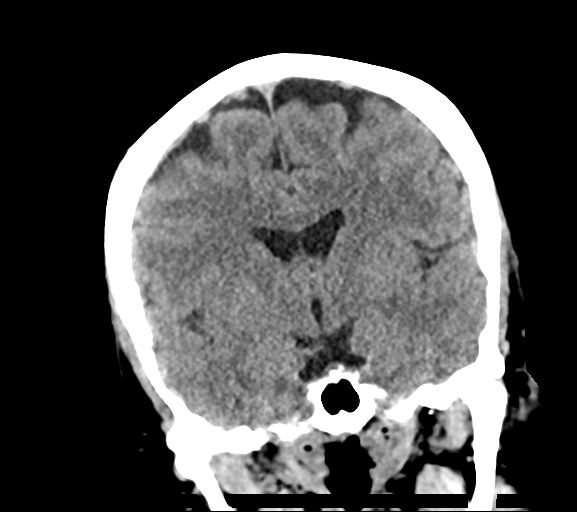
[im 40/72  brain]
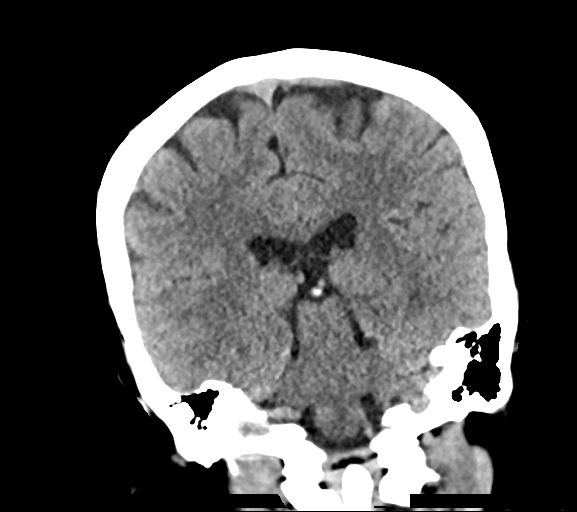

[Series 6: sag soft · sagittal · 0.34mm/px · 3 of 67 slices shown]
[im 23/67  brain]
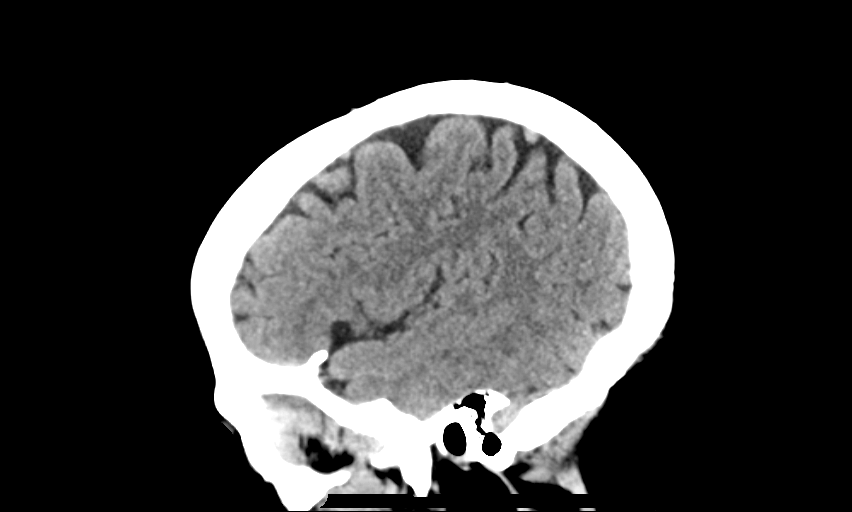
[im 34/67  brain]
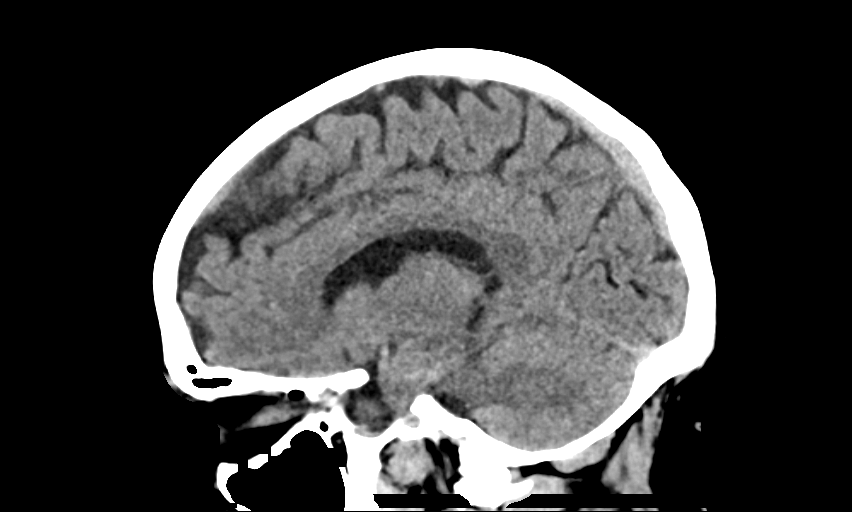
[im 45/67  brain]
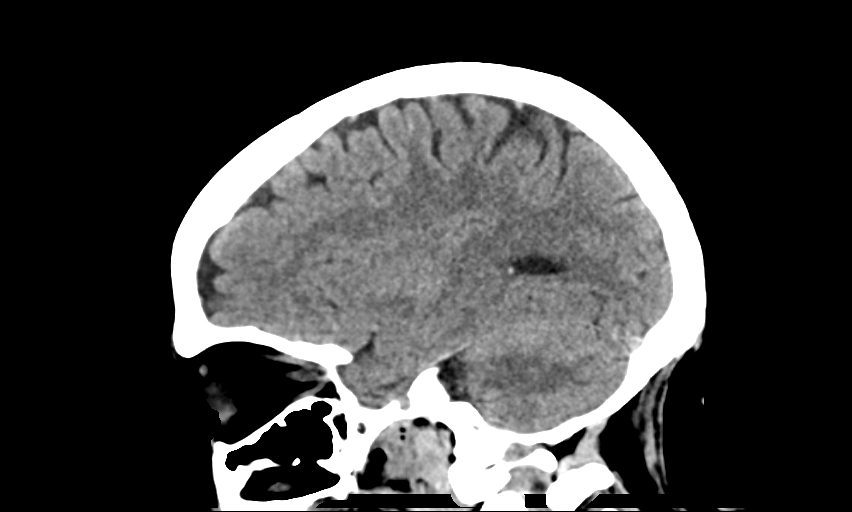

[15 of 47 positions shown; findings below may reference images not displayed]

FINDINGS: Brain: No intracranial hemorrhage, mass effect, or midline shift. No
hydrocephalus. The basilar cisterns are patent. No evidence of
territorial infarct or acute ischemia. No extra-axial or
intracranial fluid collection.

Vascular: No hyperdense vessel or unexpected calcification.

Skull: No fracture or focal lesion.

Sinuses/Orbits: Paranasal sinuses and mastoid air cells are clear.
The visualized orbits are unremarkable.

Other: None.
IMPRESSION: No acute intracranial abnormality.

## 2018-09-22 MED ORDER — DIPHENHYDRAMINE HCL 50 MG/ML IJ SOLN
12.5000 mg | Freq: Once | INTRAMUSCULAR | Status: AC
  Administered 2018-09-22: 12.5 mg via INTRAVENOUS
  Filled 2018-09-22: qty 1

## 2018-09-22 MED ORDER — SODIUM CHLORIDE 0.9 % IV BOLUS
1000.0000 mL | Freq: Once | INTRAVENOUS | Status: AC
  Administered 2018-09-22: 1000 mL via INTRAVENOUS

## 2018-09-22 MED ORDER — PROCHLORPERAZINE EDISYLATE 10 MG/2ML IJ SOLN
10.0000 mg | Freq: Once | INTRAMUSCULAR | Status: AC
  Administered 2018-09-22: 10 mg via INTRAVENOUS
  Filled 2018-09-22: qty 2

## 2018-09-22 NOTE — ED Notes (Signed)
BIB EMS from home reporting sudden onset of head pressure, N/V. Given 4mg  Zofran en route.

## 2018-09-22 NOTE — ED Notes (Signed)
E-signature not available, pt verbalized understanding of DC instructions  

## 2018-09-22 NOTE — ED Provider Notes (Signed)
[facility] EMERGENCY DEPARTMENT Provider Note   CSN: [id number] Arrival date & time: 09/22/18  0114    History   Chief Complaint Chief Complaint  Patient presents with  . Emesis    HPI Janice Hanson is a 52 y.o. female.  The patient is a 52 year old female with a past medical history IIH with secondary optic nerve atrophy.  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 She has been on Diamox in the past, but took herself off in 2002 due to frequent urination. She now undergoes yearly surveillance LPs with her last LP in June 2019. Opening pressures have been fairly stable since 2015.  The patient presents to the emergency department today for sudden onset of a severe headache in her occiput.  This began at 12:30 AM.  Symptoms associated with a feeling of chills as well as nausea.  She had one episode of vomiting prior to arrival.  She has not had any hearing changes, extremity numbness or paresthesias, extremity weakness.  No medications taken prior to arrival.  The patient was given Zofran upon arrival to the ED with improvement in her nausea.  She states that her headache has remained constant.  She has had similar episodes of sudden onset headache previously; husband can recall 2 prior occasions in her distant past.     Past Medical History:  Diagnosis Date  . Abnormal finding in CSF   . Arthritis   . Blindness - both eyes    can see shadows w/ left eye  . GERD (gastroesophageal reflux disease)   . Helicobacter pylori gastritis 11/20/2014  . Hyperlipidemia   . Hypertension   . Hypothyroidism    pt denies  . Kidney stones   . Low blood pressure   . Obesity   . Pseudomembranous colitis    hx of  . Pseudotumor cerebri    followed by Dr. Lucia Gaskins @ [facility]  . Right knee meniscal tear    w/ lateral plateau stress fx  . Syncope 10/31/2015    Patient Active Problem List   Diagnosis  Date Noted  . Medial meniscus tear 10/10/2016  . Insufficiency fracture of tibia 10/10/2016  . Hereditary and idiopathic peripheral neuropathy 01/25/2016  . B12 deficiency 01/25/2016  . Pre-diabetes 01/25/2016  . Syncope 10/31/2015  . Helicobacter pylori gastritis 11/20/2014  . Chest pain, atypical 08/16/2014  . SOB (shortness of breath) 08/16/2014  . Obese 08/16/2014  . Pseudotumor cerebri 08/16/2014  . Blindness 08/16/2014  . Chest pain 08/16/2014  . GERD (gastroesophageal reflux disease) 08/16/2014    Past Surgical History:  Procedure Laterality Date  . KNEE ARTHROSCOPY Right 10/10/2016   Procedure: ARTHROSCOPY KNEE WITH PARTIAL MEDIAL MENISECTOMY AND LATERAL SUBCHONDROPLASTY;  Surgeon: Yolonda Kida, MD;  Location: [facility];  Service: Orthopedics;  Laterality: Right;  . TONSILLECTOMY       OB History   None      Home Medications    Prior to Admission medications   Medication Sig Start Date End Date Taking? Authorizing Provider  omeprazole (PRILOSEC) 40 MG capsule Take 40 mg by mouth daily.   Yes [provider]  pantoprazole (PROTONIX) 40 MG tablet Take 1 tablet (40 mg total) by mouth daily. Patient not taking: Reported on 09/22/2018 03/03/17   Iva Boop, MD    Family History Family History  Problem Relation Age of Onset  . Diabetes Brother   . Heart disease  Paternal Grandfather   . Kidney Stones Maternal Uncle   . Diabetes Maternal Grandmother   . Heart disease Maternal Grandmother   . Diabetes Sister   . Colon cancer Neg Hx     Social History Social History   Tobacco Use  . Smoking status: Never Smoker  . Smokeless tobacco: Never Used  Substance Use Topics  . Alcohol use: No    Alcohol/week: 0.0 standard drinks  . Drug use: No     Allergies   Patient has no known allergies.   Review of Systems Review of Systems Ten systems reviewed and are negative for acute change, except as noted in the HPI.     Physical Exam Updated Vital Signs BP 103/70   Pulse 64   Temp 98.7 F (37.1 C) (Oral)   Resp 16   Ht 5\' 7"  (1.702 m)   Wt 129.3 kg   SpO2 95%   BMI 44.64 kg/m   Physical Exam  Constitutional: She is oriented to person, place, and time. She appears well-developed and well-nourished. No distress.  Nontoxic appearing and in NAD  HENT:  Head: Normocephalic and atraumatic.  Mouth/Throat: Oropharynx is clear and moist.  Eyes: Conjunctivae and EOM are normal. No scleral icterus.  Neck: Normal range of motion.  Cardiovascular: Normal rate, regular rhythm and intact distal pulses.  Pulmonary/Chest: Effort normal. No stridor. No respiratory distress.  Respirations even and unlabored  Musculoskeletal: Normal range of motion.  Neurological: She is alert and oriented to person, place, and time. No cranial nerve deficit. She exhibits normal muscle tone. Coordination normal.  GCS 15. Speech is goal oriented. No cranial nerve deficits appreciated; symmetric eyebrow raise, no facial drooping, tongue midline. Patient has equal grip strength bilaterally with 5/5 strength against resistance in all major muscle groups bilaterally. Sensation to light touch intact. Patient moves extremities without ataxia.  Skin: Skin is warm and dry. No rash noted. She is not diaphoretic. No erythema. No pallor.  Psychiatric: She has a normal mood and affect. Her behavior is normal.  Nursing note and vitals reviewed.    ED Treatments / Results  Labs (all labs ordered are listed, but only abnormal results are displayed) Labs Reviewed  COMPREHENSIVE METABOLIC PANEL - Abnormal; Notable for the following components:      Result Value   Glucose, Bld 148 (*)    Calcium 8.8 (*)    Albumin 3.1 (*)    All other components within normal limits  URINALYSIS, ROUTINE W REFLEX MICROSCOPIC - Abnormal; Notable for the following components:   Color, Urine STRAW (*)    Hgb urine dipstick SMALL (*)    All other components  within normal limits  LIPASE, BLOOD  CBC  I-STAT BETA HCG BLOOD, ED ([facility], [facility], [facility] ONLY)    EKG None  Radiology Ct Head Wo Contrast  Result Date: 09/22/2018 CLINICAL DATA:  Headache, acute, normal neuro exam EXAM: CT HEAD WITHOUT CONTRAST TECHNIQUE: Contiguous axial images were obtained from the base of the skull through the vertex without intravenous contrast. COMPARISON:  Head CT 09/30/2015 FINDINGS: Brain: No intracranial hemorrhage, mass effect, or midline shift. No hydrocephalus. The basilar cisterns are patent. No evidence of territorial infarct or acute ischemia. No extra-axial or intracranial fluid collection. Vascular: No hyperdense vessel or unexpected calcification. Skull: No fracture or focal lesion. Sinuses/Orbits: Paranasal sinuses and mastoid air cells are clear. The visualized orbits are unremarkable. Other: None. IMPRESSION: No acute intracranial abnormality. Electronically Signed   By: Ivette Loyal.D.  On: 09/22/2018 02:46    Procedures Procedures (including critical care time)  Medications Ordered in ED Medications  sodium chloride 0.9 % bolus 1,000 mL (0 mLs Intravenous Stopped 09/22/18 0404)  prochlorperazine (COMPAZINE) injection 10 mg (10 mg Intravenous Given 09/22/18 0224)  diphenhydrAMINE (BENADRYL) injection 12.5 mg (12.5 mg Intravenous Given 09/22/18 0224)    4:00 AM Patient reassessed by MD Erma HeritageIsaacs.  She endorses feeling much better. CT has been reassuring.   Initial Impression / Assessment and Plan / ED Course  I have reviewed the triage vital signs and the nursing notes.  Pertinent labs & imaging results that were available during my care of the patient were reviewed by me and considered in my medical decision making (see chart for details).     Patient presents to the emergency department for evaluation of headache which began at 12:30 AM.  Patient with no history of recent head injury or trauma.  No fever, nuchal rigidity, meningismus to suggest  meningitis.  Neurologic exam today is nonfocal.  CT without evidence of SAH.  On reassessment, the patient has had significant improvement in headache symptoms following a migraine cocktail.  I do not believe further emergent workup is indicated at this time.  Encouraged outpatient neurology follow up given hx of IIH.  Return precautions discussed and provided.  Patient discharged in stable condition with no unaddressed concerns.   Final Clinical Impressions(s) / ED Diagnoses   Final diagnoses:  Occipital headache    ED Discharge Orders    None       Antony MaduraHumes, Guynell Kleiber, PA-C 09/22/18 0416    Shaune PollackIsaacs, Cameron, MD 09/22/18 [phone]

## 2019-05-04 ENCOUNTER — Emergency Department (HOSPITAL_COMMUNITY)
Admission: EM | Admit: 2019-05-04 | Discharge: 2019-05-04 | Disposition: A | Discharge status: Home / Self Care | Payer: Medicaid Other | Attending: Emergency Medicine | Admitting: Emergency Medicine

## 2019-05-04 ENCOUNTER — Encounter (HOSPITAL_COMMUNITY): Payer: Self-pay | Admitting: Emergency Medicine

## 2019-05-04 ENCOUNTER — Other Ambulatory Visit: Payer: Self-pay

## 2019-05-04 DIAGNOSIS — R112 Nausea with vomiting, unspecified: Secondary | ICD-10-CM | POA: Diagnosis not present

## 2019-05-04 DIAGNOSIS — E039 Hypothyroidism, unspecified: Secondary | ICD-10-CM | POA: Insufficient documentation

## 2019-05-04 DIAGNOSIS — Z79899 Other long term (current) drug therapy: Secondary | ICD-10-CM | POA: Diagnosis not present

## 2019-05-04 DIAGNOSIS — I10 Essential (primary) hypertension: Secondary | ICD-10-CM | POA: Diagnosis not present

## 2019-05-04 DIAGNOSIS — G932 Benign intracranial hypertension: Secondary | ICD-10-CM | POA: Diagnosis not present

## 2019-05-04 DIAGNOSIS — H548 Legal blindness, as defined in USA: Secondary | ICD-10-CM | POA: Insufficient documentation

## 2019-05-04 DIAGNOSIS — R51 Headache: Secondary | ICD-10-CM | POA: Diagnosis present

## 2019-05-04 LAB — URINALYSIS, ROUTINE W REFLEX MICROSCOPIC
Bilirubin Urine: NEGATIVE
Glucose, UA: NEGATIVE mg/dL
Hgb urine dipstick: NEGATIVE
Ketones, ur: NEGATIVE mg/dL
Leukocytes,Ua: NEGATIVE
Nitrite: NEGATIVE
Protein, ur: NEGATIVE mg/dL
Specific Gravity, Urine: 1.005 (ref 1.005–1.030)
pH: 6 (ref 5.0–8.0)

## 2019-05-04 LAB — COMPREHENSIVE METABOLIC PANEL
ALT: 18 U/L (ref 0–44)
AST: 22 U/L (ref 15–41)
Albumin: 3.1 g/dL — ABNORMAL LOW (ref 3.5–5.0)
Alkaline Phosphatase: 87 U/L (ref 38–126)
Anion gap: 11 (ref 5–15)
BUN: 5 mg/dL — ABNORMAL LOW (ref 6–20)
CO2: 24 mmol/L (ref 22–32)
Calcium: 8.9 mg/dL (ref 8.9–10.3)
Chloride: 107 mmol/L (ref 98–111)
Creatinine, Ser: 0.67 mg/dL (ref 0.44–1.00)
GFR calc Af Amer: >60 mL/min (ref >60)
GFR calc non Af Amer: >60 mL/min (ref >60)
Glucose, Bld: 144 mg/dL — ABNORMAL HIGH (ref 70–99)
Potassium: 3.5 mmol/L (ref 3.5–5.1)
Sodium: 142 mmol/L (ref 135–145)
Total Bilirubin: 0.4 mg/dL (ref 0.3–1.2)
Total Protein: 7.3 g/dL (ref 6.5–8.1)

## 2019-05-04 LAB — CBC
HCT: 43.5 % (ref 36.0–46.0)
Hemoglobin: 14.3 g/dL (ref 12.0–15.0)
MCH: 28.1 pg (ref 26.0–34.0)
MCHC: 32.9 g/dL (ref 30.0–36.0)
MCV: 85.6 fL (ref 80.0–100.0)
Platelets: 285 10*3/uL (ref 150–400)
RBC: 5.08 MIL/uL (ref 3.87–5.11)
RDW: 13.3 % (ref 11.5–15.5)
WBC: 5.1 10*3/uL (ref 4.0–10.5)
nRBC: 0 % (ref 0.0–0.2)

## 2019-05-04 LAB — LIPASE, BLOOD: Lipase: 38 U/L (ref 11–51)

## 2019-05-04 MED ORDER — ACETAZOLAMIDE ER 500 MG PO CP12: 500.0000 mg | ORAL_CAPSULE | Freq: Two times a day (BID) | ORAL | 3 refills | Status: DC

## 2019-05-04 MED ORDER — SODIUM CHLORIDE 0.9% FLUSH: 3.0000 mL | Freq: Once | INTRAVENOUS | Status: DC

## 2019-05-04 NOTE — ED Triage Notes (Signed)
Pt reports emesis x 2 -3 days, unable to eat or drink. Pt reports headache, "weird feeling in ears, like water is in them." Pt denies abdominal pain.

## 2019-05-04 NOTE — ED Provider Notes (Signed)
[facility] EMERGENCY DEPARTMENT Provider Note   CSN: [id number] Arrival date & time: 05/04/19  0216    History   Chief Complaint Chief Complaint  Patient presents with  . Emesis    HPI Janice Hanson is a 53 y.o. female.     Patient presents to the emergency department for evaluation of headache with nausea and vomiting.  Does have been ongoing for 2 or 3 days.  Patient reports that she has a history of "increased pressure in her brain".  She says usually when she feels like that she needs to have fluid drained.  Headache is global and severe.  No neck pain or stiffness.  No fever.  She is legally blind in both eyes secondary to pseudotumor cerebri, for no identifiable vision change.  She has had a feeling of "fluid" in her right ear.     Past Medical History:  Diagnosis Date  . Abnormal finding in CSF   . Arthritis   . Blindness - both eyes    can see shadows w/ left eye  . GERD (gastroesophageal reflux disease)   . Helicobacter pylori gastritis 11/20/2014  . Hyperlipidemia   . Hypertension   . Hypothyroidism    pt denies  . Kidney stones   . Low blood pressure   . Obesity   . Pseudomembranous colitis    hx of  . Pseudotumor cerebri    followed by Dr. Lucia GaskinsAhern @ [facility]  . Right knee meniscal tear    w/ lateral plateau stress fx  . Syncope 10/31/2015    Patient Active Problem List   Diagnosis Date Noted  . Medial meniscus tear 10/10/2016  . Insufficiency fracture of tibia 10/10/2016  . Hereditary and idiopathic peripheral neuropathy 01/25/2016  . B12 deficiency 01/25/2016  . Pre-diabetes 01/25/2016  . Syncope 10/31/2015  . Helicobacter pylori gastritis 11/20/2014  . Chest pain, atypical 08/16/2014  . SOB (shortness of breath) 08/16/2014  . Obese 08/16/2014  . Pseudotumor cerebri 08/16/2014  . Blindness 08/16/2014  . Chest pain 08/16/2014  . GERD (gastroesophageal reflux disease) 08/16/2014    Past Surgical History:   Procedure Laterality Date  . KNEE ARTHROSCOPY Right 10/10/2016   Procedure: ARTHROSCOPY KNEE WITH PARTIAL MEDIAL MENISECTOMY AND LATERAL SUBCHONDROPLASTY;  Surgeon: Yolonda KidaJason Patrick Rogers, MD;  [facility];  Service: Orthopedics;  Laterality: Right;  . TONSILLECTOMY       OB History   No obstetric history on file.      Home Medications    Prior to Admission medications   Medication Sig Start Date End Date Taking? Authorizing Provider  [facility] VITAMIN B12 1000 MCG tablet Take 1,000 mcg by mouth daily. 02/09/19  Yes [provider]  omeprazole (PRILOSEC) 40 MG capsule Take 40 mg by mouth daily.   Yes [provider]  VITAMIN D PO Take 1 tablet by mouth daily.   Yes [provider]  acetaZOLAMIDE (DIAMOX SEQUELS) 500 MG capsule Take 1 capsule (500 mg total) by mouth 2 (two) times daily. 05/04/19   Gilda CreasePollina, Yarisa Lynam J, MD  pantoprazole (PROTONIX) 40 MG tablet Take 1 tablet (40 mg total) by mouth daily. Patient not taking: Reported on 09/22/2018 03/03/17   Iva BoopGessner, Carl E, MD    Family History Family History  Problem Relation Age of Onset  . Diabetes Brother   . Heart disease Paternal Grandfather   . Kidney Stones Maternal Uncle   . Diabetes Maternal Grandmother   . Heart disease Maternal Grandmother   .  Diabetes Sister   . Colon cancer Neg Hx     Social History Social History   Tobacco Use  . Smoking status: Never Smoker  . Smokeless tobacco: Never Used  Substance Use Topics  . Alcohol use: No    Alcohol/week: 0.0 standard drinks  . Drug use: No     Allergies   Patient has no known allergies.   Review of Systems Review of Systems  Gastrointestinal: Positive for nausea and vomiting.  Neurological: Positive for headaches.  All other systems reviewed and are negative.    Physical Exam Updated Vital Signs BP 122/67   Pulse (!) 58   Temp 98 F (36.7 C) (Oral)   Resp 16   SpO2 96%   Physical Exam Vitals signs  and nursing note reviewed.  Constitutional:      General: She is not in acute distress.    Appearance: Normal appearance. She is well-developed.  HENT:     Head: Normocephalic and atraumatic.     Right Ear: Hearing normal.     Left Ear: Hearing normal.     Nose: Nose normal.  Eyes:     Conjunctiva/sclera: Conjunctivae normal.     Pupils: Pupils are equal, round, and reactive to light.  Neck:     Musculoskeletal: Normal range of motion and neck supple.  Cardiovascular:     Rate and Rhythm: Regular rhythm.     Heart sounds: S1 normal and S2 normal. No murmur. No friction rub. No gallop.   Pulmonary:     Effort: Pulmonary effort is normal. No respiratory distress.     Breath sounds: Normal breath sounds.  Chest:     Chest wall: No tenderness.  Abdominal:     General: Bowel sounds are normal.     Palpations: Abdomen is soft.     Tenderness: There is no abdominal tenderness. There is no guarding or rebound. Negative signs include Murphy's sign and McBurney's sign.     Hernia: No hernia is present.  Musculoskeletal: Normal range of motion.  Skin:    General: Skin is warm and dry.     Findings: No rash.  Neurological:     Mental Status: She is alert and oriented to person, place, and time.     GCS: GCS eye subscore is 4. GCS verbal subscore is 5. GCS motor subscore is 6.     Cranial Nerves: No cranial nerve deficit.     Sensory: No sensory deficit.     Coordination: Coordination normal.  Psychiatric:        Speech: Speech normal.        Behavior: Behavior normal.        Thought Content: Thought content normal.      ED Treatments / Results  Labs (all labs ordered are listed, but only abnormal results are displayed) Labs Reviewed  COMPREHENSIVE METABOLIC PANEL - Abnormal; Notable for the following components:      Result Value   Glucose, Bld 144 (*)    BUN 5 (*)    Albumin 3.1 (*)    All other components within normal limits  URINALYSIS, ROUTINE W REFLEX MICROSCOPIC -  Abnormal; Notable for the following components:   Color, Urine STRAW (*)    All other components within normal limits  LIPASE, BLOOD  CBC    EKG None  Radiology No results found.  Procedures .Lumbar Puncture  Date/Time: 05/04/2019 7:07 AM Performed by: Orpah Greek, MD Authorized by: Orpah Greek, MD  Consent:    Consent obtained:  Written   Consent given by:  Patient   Risks discussed:  Bleeding, infection, pain, repeat procedure and headache Universal protocol:    Procedure explained and questions answered to patient or proxy's satisfaction: yes     Relevant documents present and verified: yes     Test results available and properly labeled: yes     Imaging studies available: yes     Required blood products, implants, devices, and special equipment available: yes     Immediately prior to procedure a time out was called: yes     Site/side marked: yes     Patient identity confirmed:  Verbally with patient and hospital-assigned identification number Pre-procedure details:    Procedure purpose:  Therapeutic   Preparation: Patient was prepped and draped in usual sterile fashion   Anesthesia (see MAR for exact dosages):    Anesthesia method:  Local infiltration   Local anesthetic:  Lidocaine 1% w/o epi Procedure details:    Lumbar space:  L4-L5 interspace   Patient position:  L lateral decubitus   Needle gauge:  18   Needle type:  Spinal needle - Quincke tip   Needle length (in):  3.5   Ultrasound guidance: no     Number of attempts:  5 or more   Opening pressure (cm H2O):  25   Closing pressure (cm H2O):  14   Fluid appearance:  Blood-tinged then clearing   Tubes of fluid:  4   Total volume (ml):  15 Post-procedure:    Puncture site:  Adhesive bandage applied   Patient tolerance of procedure:  Tolerated well, no immediate complications   (including critical care time)  Medications Ordered in ED Medications  sodium chloride flush (NS) 0.9 %  injection 3 mL (3 mLs Intravenous Not Given 05/04/19 0458)     Initial Impression / Assessment and Plan / ED Course  I have reviewed the triage vital signs and the nursing notes.  Pertinent labs & imaging results that were available during my care of the patient were reviewed by me and considered in my medical decision making (see chart for details).        Patient presents to the emergency department for evaluation of headache.  She has had associated nausea and vomiting.  Patient reports that she has had similar symptoms in the past secondary to her pseudotumor cerebri.  Reviewing records reveals that [facility].  At that time she had been noncompliant with her Diamox but her yearly survey LPs did not show elevated opening pressure.  She does not have any focal neurologic signs on examination.  No history of brain tumor.  No concern for intracranial bleed or infection.  She is not on blood thinners.  It was decided to proceed with therapeutic lumbar puncture procedure.  Procedure was performed, opening pressure was 25.  15 mL's of CSF was drained at which time closing pressure was 14.  Patient had immediate resolution of her headache.  Based on this I did not feel she needed any diagnostics performed on her CSF.  Counseled her that she needs to start Diamox and to follow-up with neurology.  Final Clinical Impressions(s) / ED Diagnoses   Final diagnoses:  Pseudotumor cerebri    ED Discharge Orders         Ordered    acetaZOLAMIDE (DIAMOX SEQUELS) 500 MG capsule  2 times daily     05/04/19 0710  Gilda CreasePollina, Misbah Hornaday J, MD 05/04/19 [phone]

## 2019-07-05 ENCOUNTER — Encounter (HOSPITAL_COMMUNITY): Payer: Self-pay | Admitting: *Deleted

## 2019-07-05 ENCOUNTER — Emergency Department (HOSPITAL_COMMUNITY): Payer: Medicaid Other

## 2019-07-05 ENCOUNTER — Other Ambulatory Visit: Payer: Self-pay

## 2019-07-05 DIAGNOSIS — I10 Essential (primary) hypertension: Secondary | ICD-10-CM | POA: Insufficient documentation

## 2019-07-05 DIAGNOSIS — E039 Hypothyroidism, unspecified: Secondary | ICD-10-CM | POA: Diagnosis not present

## 2019-07-05 DIAGNOSIS — R1110 Vomiting, unspecified: Secondary | ICD-10-CM | POA: Insufficient documentation

## 2019-07-05 DIAGNOSIS — R51 Headache: Secondary | ICD-10-CM | POA: Diagnosis present

## 2019-07-05 LAB — DIFFERENTIAL
Abs Immature Granulocytes: 0.01 10*3/uL (ref 0.00–0.07)
Basophils Absolute: 0 10*3/uL (ref 0.0–0.1)
Basophils Relative: 1 %
Eosinophils Absolute: 0.2 10*3/uL (ref 0.0–0.5)
Eosinophils Relative: 5 %
Immature Granulocytes: 0 %
Lymphocytes Relative: 41 %
Lymphs Abs: 2 10*3/uL (ref 0.7–4.0)
Monocytes Absolute: 0.4 10*3/uL (ref 0.1–1.0)
Monocytes Relative: 8 %
Neutro Abs: 2.2 10*3/uL (ref 1.7–7.7)
Neutrophils Relative %: 45 %

## 2019-07-05 LAB — COMPREHENSIVE METABOLIC PANEL
ALT: 19 U/L (ref 0–44)
AST: 21 U/L (ref 15–41)
Albumin: 3.4 g/dL — ABNORMAL LOW (ref 3.5–5.0)
Alkaline Phosphatase: 92 U/L (ref 38–126)
Anion gap: 8 (ref 5–15)
BUN: 9 mg/dL (ref 6–20)
CO2: 27 mmol/L (ref 22–32)
Calcium: 9.1 mg/dL (ref 8.9–10.3)
Chloride: 108 mmol/L (ref 98–111)
Creatinine, Ser: 0.66 mg/dL (ref 0.44–1.00)
GFR calc Af Amer: >60 mL/min (ref >60)
GFR calc non Af Amer: >60 mL/min (ref >60)
Glucose, Bld: 111 mg/dL — ABNORMAL HIGH (ref 70–99)
Potassium: 4.2 mmol/L (ref 3.5–5.1)
Sodium: 143 mmol/L (ref 135–145)
Total Bilirubin: 0.3 mg/dL (ref 0.3–1.2)
Total Protein: 7.8 g/dL (ref 6.5–8.1)

## 2019-07-05 LAB — CBC
HCT: 43.5 % (ref 36.0–46.0)
Hemoglobin: 14 g/dL (ref 12.0–15.0)
MCH: 27.8 pg (ref 26.0–34.0)
MCHC: 32.2 g/dL (ref 30.0–36.0)
MCV: 86.5 fL (ref 80.0–100.0)
Platelets: 288 10*3/uL (ref 150–400)
RBC: 5.03 MIL/uL (ref 3.87–5.11)
RDW: 13.5 % (ref 11.5–15.5)
WBC: 4.8 10*3/uL (ref 4.0–10.5)
nRBC: 0 % (ref 0.0–0.2)

## 2019-07-05 LAB — PROTIME-INR
INR: 1.1 (ref 0.8–1.2)
Prothrombin Time: 13.6 seconds (ref 11.4–15.2)

## 2019-07-05 LAB — APTT: aPTT: 32 seconds (ref 24–36)

## 2019-07-05 LAB — I-STAT BETA HCG BLOOD, ED (NOT ORDERABLE): I-stat hCG, quantitative: <5 m[IU]/mL (ref <5)

## 2019-07-05 IMAGING — CT CT HEAD W/O CM
3 series · 15 of 47 positions shown, 18 images · non-contrast
Comparison: CT [DATE]

CLINICAL DATA: Headache, right head numbness and emesis

EXAM:
CT HEAD WITHOUT CONTRAST
TECHNIQUE: Contiguous axial images were obtained from the base of the skull
through the vertex without intravenous contrast.

[Series 2: head wo · axial · 0.45mm/px · z∈[-157,-32]mm · 9 of 31 slices shown, 12 images]
[im 3/31  brain]
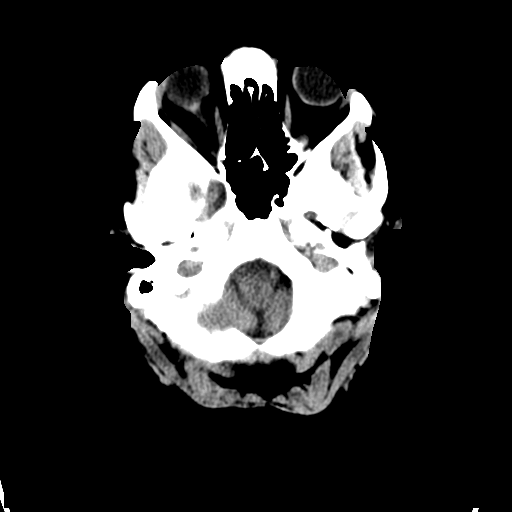
[im 3/31  bone]
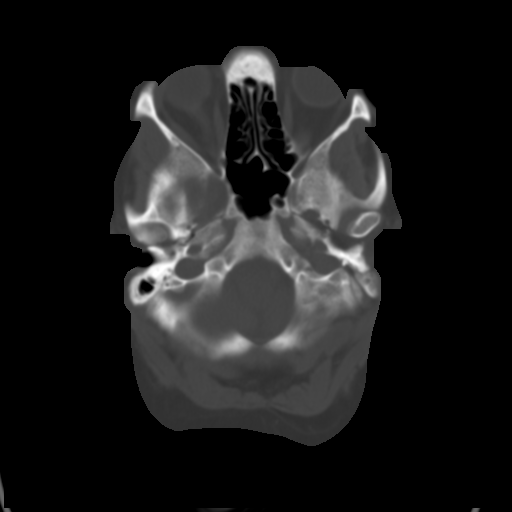
[im 6/31  brain]
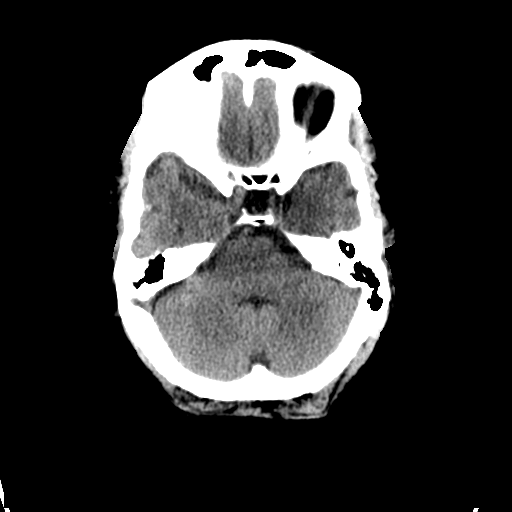
[im 9/31  brain]
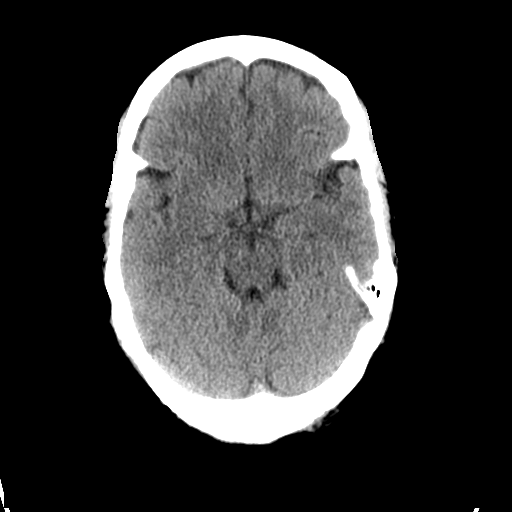
[im 12/31  brain]
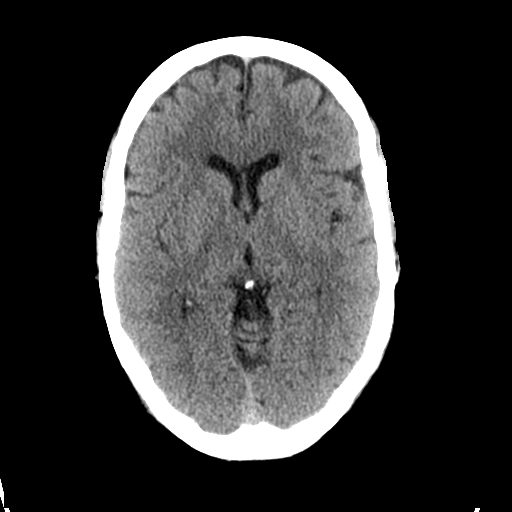
[im 16/31  brain]
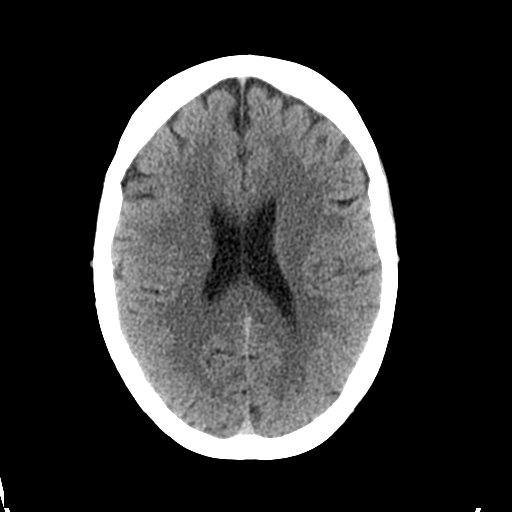
[im 16/31  bone]
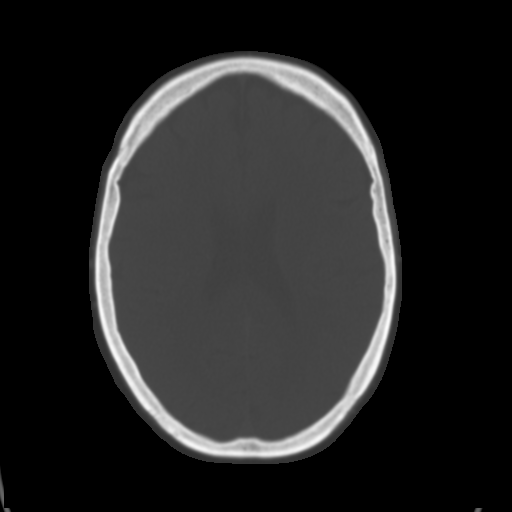
[im 19/31  brain]
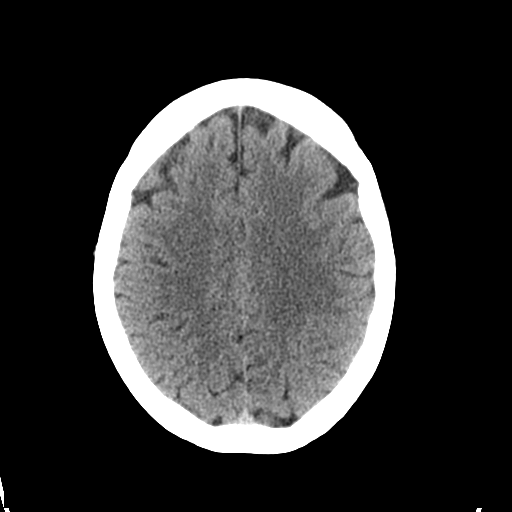
[im 22/31  brain]
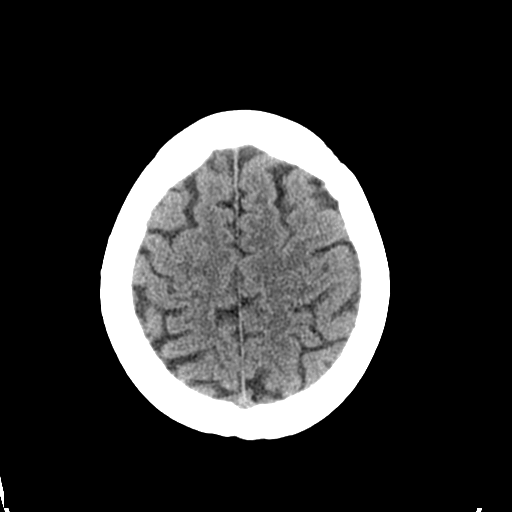
[im 25/31  brain]
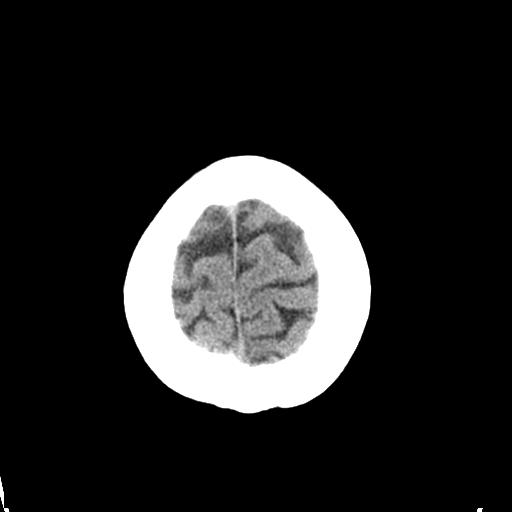
[im 28/31  brain]
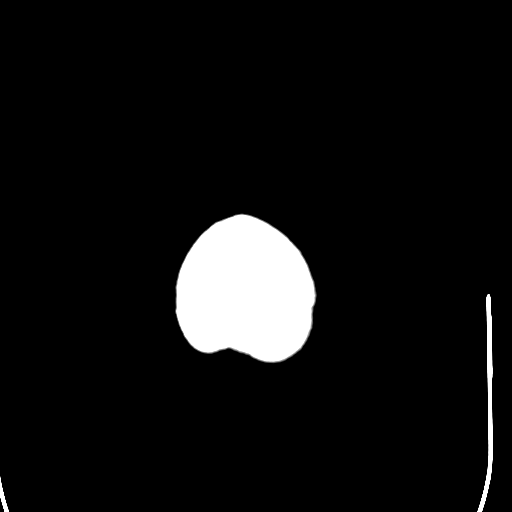
[im 28/31  bone]
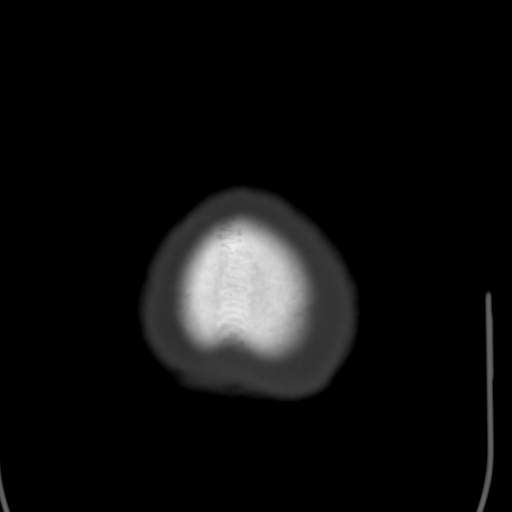

[Series 5: coronal soft tissue · coronal · 0.30mm/px · 3 of 70 slices shown]
[im 24/70  brain]
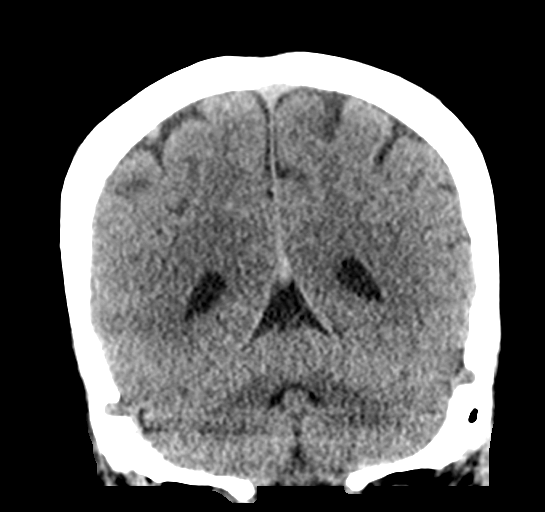
[im 31/70  brain]
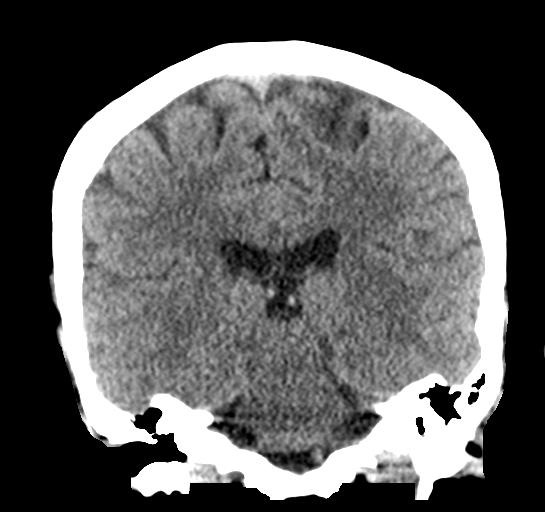
[im 39/70  brain]
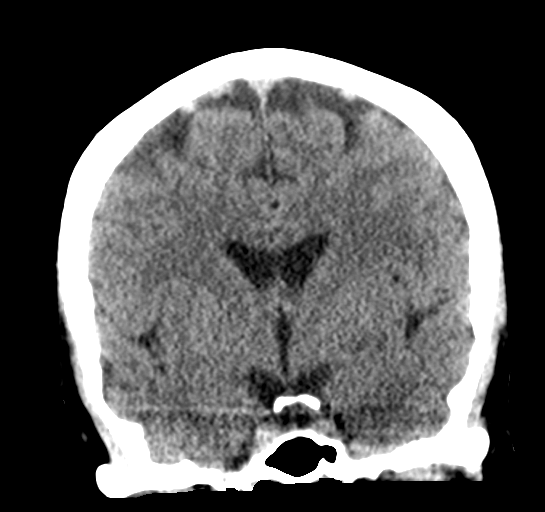

[Series 6: sagittal soft tissue · sagittal · 0.30mm/px · 3 of 57 slices shown]
[im 19/57  brain]
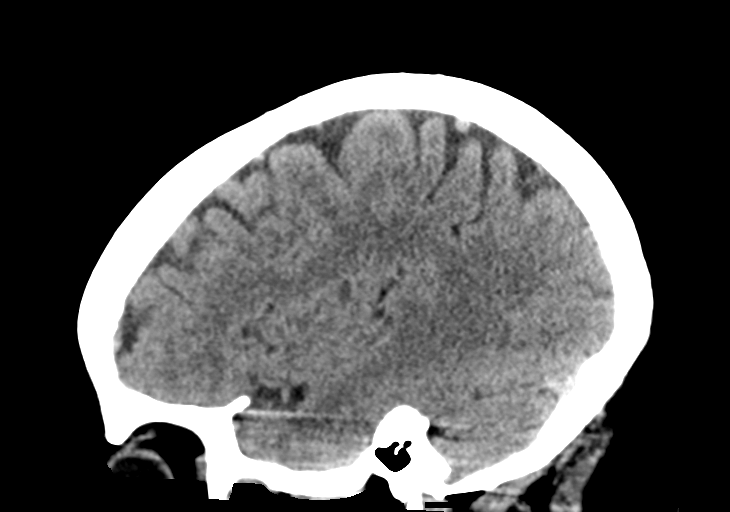
[im 29/57  brain]
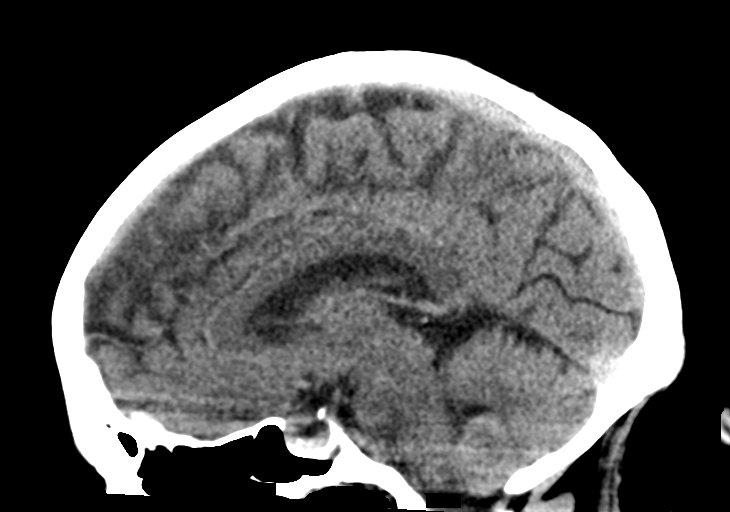
[im 38/57  brain]
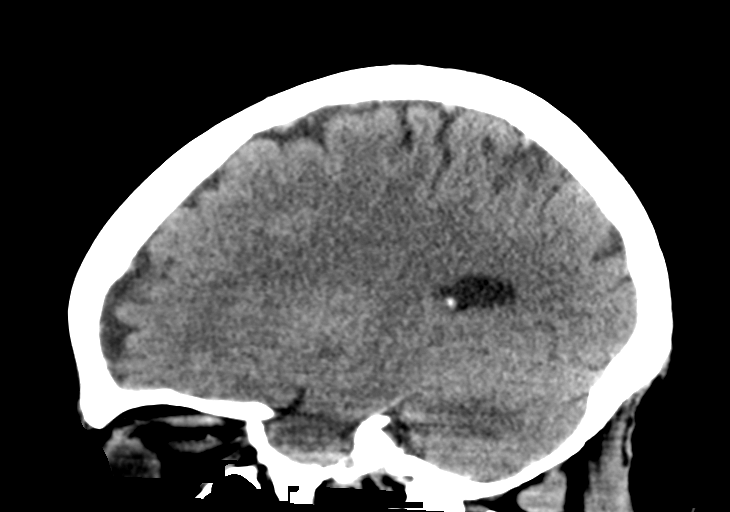

[15 of 47 positions shown; findings below may reference images not displayed]

FINDINGS: Brain: No evidence of acute infarction, hemorrhage, hydrocephalus,
extra-axial collection or mass lesion/mass effect.

Vascular: No hyperdense vessel or unexpected calcification.

Skull: No calvarial fracture or suspicious osseous lesion. No scalp
swelling or hematoma.

Sinuses/Orbits: Paranasal sinuses and mastoid air cells are
predominantly clear. Included orbital structures are unremarkable.

Other: None
IMPRESSION: No acute intracranial abnormality

## 2019-07-05 MED ORDER — SODIUM CHLORIDE 0.9% FLUSH: 3.0000 mL | Freq: Once | INTRAVENOUS | Status: DC

## 2019-07-05 NOTE — ED Notes (Signed)
Pt is not on blood thinners, spoke to dr. Tamera Punt about pt.

## 2019-07-05 NOTE — ED Triage Notes (Signed)
Pt reports right sided head "numbness", headache, and also vomiting. Janice Hanson x 4 with equal grip, no facial symmetrical. Pt was sent directly from Dr. Ardelle Anton office. Spouse said yesterday, she was having some left ear pain. Today, she felt some pain in the right ear and loss hearing temporarily, now has subsided.

## 2019-07-06 ENCOUNTER — Emergency Department (HOSPITAL_COMMUNITY)
Admission: EM | Admit: 2019-07-06 | Discharge: 2019-07-06 | Disposition: A | Discharge status: Home / Self Care | Payer: Medicaid Other | Attending: Emergency Medicine | Admitting: Emergency Medicine

## 2019-07-06 DIAGNOSIS — I10 Essential (primary) hypertension: Secondary | ICD-10-CM

## 2019-07-06 DIAGNOSIS — R519 Headache, unspecified: Secondary | ICD-10-CM

## 2019-07-06 LAB — GLUCOSE, CAPILLARY: Glucose-Capillary: 90 mg/dL (ref 70–99)

## 2019-07-06 NOTE — Discharge Instructions (Signed)
Your work-up in the emergency department today has been reassuring.  We advise follow-up with Dr. Jonelle Sidle in the office.  Take your daily prescribed medications as well as the additional medications prescribed by Dr. Jonelle Sidle today.  Return for any new or concerning symptoms.

## 2019-07-06 NOTE — ED Provider Notes (Signed)
[facility] DEPT Provider Note   CSN: [id number] Arrival date & time: 07/05/19  2004     History   Chief Complaint Chief Complaint  Patient presents with  . Headache    HPI Janice Hanson is a 53 y.o. female.     53 year old female with a history of esophageal reflux, pseudotumor cerebri, hypothyroid, dyslipidemia, hypertension presents to the emergency department for evaluation.  She was sent from Dr. Leontine Locket office for further evaluation of headache.  She described a "numbness" in the right side of her head with nausea and vomiting.  Was noted to be hypertensive at the time of her pain.  She has been experiencing some proceeding left otalgia over the past 2 days.  No congestion, fevers, cough.  Denies extremity numbness or paresthesias, extremity weakness.  No medications taken prior to arrival.  Patient reports that her symptoms have now spontaneously subsided.  She has no complaints at this time.  The history is provided by the patient. No language interpreter was used.    Past Medical History:  Diagnosis Date  . Abnormal finding in CSF   . Arthritis   . Blindness - both eyes    can see shadows w/ left eye  . GERD (gastroesophageal reflux disease)   . Helicobacter pylori gastritis 11/20/2014  . Hyperlipidemia   . Hypertension   . Hypothyroidism    pt denies  . Kidney stones   . Low blood pressure   . Obesity   . Pseudomembranous colitis    hx of  . Pseudotumor cerebri    followed by Dr. Jaynee Eagles @ [facility]  . Right knee meniscal tear    w/ lateral plateau stress fx  . Syncope 10/31/2015    Patient Active Problem List   Diagnosis Date Noted  . Medial meniscus tear 10/10/2016  . Insufficiency fracture of tibia 10/10/2016  . Hereditary and idiopathic peripheral neuropathy 01/25/2016  . B12 deficiency 01/25/2016  . Pre-diabetes 01/25/2016  . Syncope 10/31/2015  . Helicobacter pylori gastritis 11/20/2014  . Chest pain,  atypical 08/16/2014  . SOB (shortness of breath) 08/16/2014  . Obese 08/16/2014  . Pseudotumor cerebri 08/16/2014  . Blindness 08/16/2014  . Chest pain 08/16/2014  . GERD (gastroesophageal reflux disease) 08/16/2014    Past Surgical History:  Procedure Laterality Date  . KNEE ARTHROSCOPY Right 10/10/2016   Procedure: ARTHROSCOPY KNEE WITH PARTIAL MEDIAL MENISECTOMY AND LATERAL SUBCHONDROPLASTY;  Surgeon: Nicholes Stairs, MD;  Location: [facility];  Service: Orthopedics;  Laterality: Right;  . TONSILLECTOMY       OB History   No obstetric history on file.      Home Medications    Prior to Admission medications   Medication Sig Start Date End Date Taking? Authorizing Provider  acetaZOLAMIDE (DIAMOX SEQUELS) 500 MG capsule Take 1 capsule (500 mg total) by mouth 2 (two) times daily. 05/04/19   Orpah Greek, MD  [facility] VITAMIN B12 1000 MCG tablet Take 1,000 mcg by mouth daily. 02/09/19   [provider]  omeprazole (PRILOSEC) 40 MG capsule Take 40 mg by mouth daily.    [provider]  pantoprazole (PROTONIX) 40 MG tablet Take 1 tablet (40 mg total) by mouth daily. Patient not taking: Reported on 09/22/2018 03/03/17   Gatha Mayer, MD  VITAMIN D PO Take 1 tablet by mouth daily.    [provider]    Family History Family History  Problem Relation Age of Onset  . Diabetes  Brother   . Heart disease Paternal Grandfather   . Kidney Stones Maternal Uncle   . Diabetes Maternal Grandmother   . Heart disease Maternal Grandmother   . Diabetes Sister   . Colon cancer Neg Hx     Social History Social History   Tobacco Use  . Smoking status: Never Smoker  . Smokeless tobacco: Never Used  Substance Use Topics  . Alcohol use: No    Alcohol/week: 0.0 standard drinks  . Drug use: No     Allergies   Patient has no known allergies.   Review of Systems Review of Systems Ten systems reviewed and are negative for acute  change, except as noted in the HPI.    Physical Exam Updated Vital Signs BP 123/74 (BP Location: Left Arm)   Pulse 92   Temp 97.7 F (36.5 C) (Oral)   Resp 15   SpO2 100%   Physical Exam Vitals signs and nursing note reviewed.  Constitutional:      General: She is not in acute distress.    Appearance: She is well-developed. She is not diaphoretic.     Comments: Obese female in NAD  HENT:     Head: Normocephalic and atraumatic.     Right Ear: Ear canal and external ear normal.     Left Ear: Tympanic membrane, ear canal and external ear normal.     Ears:     Comments: Mild effusion behind right TM    Mouth/Throat:     Mouth: Mucous membranes are moist.  Eyes:     General: No scleral icterus.    Extraocular Movements: Extraocular movements intact.     Conjunctiva/sclera: Conjunctivae normal.     Pupils: Pupils are equal, round, and reactive to light.  Neck:     Musculoskeletal: Normal range of motion.  Cardiovascular:     Rate and Rhythm: Normal rate and regular rhythm.     Pulses: Normal pulses.  Pulmonary:     Effort: Pulmonary effort is normal. No respiratory distress.     Comments: Respirations even and unlabored Musculoskeletal: Normal range of motion.  Skin:    General: Skin is warm and dry.     Coloration: Skin is not pale.     Findings: No erythema or rash.  Neurological:     General: No focal deficit present.     Mental Status: She is alert and oriented to person, place, and time.     Coordination: Coordination normal.     Comments: GCS 15. Speech is goal oriented. No cranial nerve deficits appreciated; symmetric eyebrow raise, no facial drooping, tongue midline. Patient has equal grip strength bilaterally with 5/5 strength against resistance in all major muscle groups bilaterally. Sensation to light touch intact. Patient moves extremities without ataxia.  Psychiatric:        Behavior: Behavior normal.      ED Treatments / Results  Labs (all labs ordered  are listed, but only abnormal results are displayed) Labs Reviewed  COMPREHENSIVE METABOLIC PANEL - Abnormal; Notable for the following components:      Result Value   Glucose, Bld 111 (*)    Albumin 3.4 (*)    All other components within normal limits  PROTIME-INR  APTT  CBC  DIFFERENTIAL  GLUCOSE, CAPILLARY  CBG MONITORING, ED  I-STAT BETA HCG BLOOD, ED ([facility], [facility], [facility] ONLY)  I-STAT BETA HCG BLOOD, ED (NOT ORDERABLE)    EKG EKG Interpretation  Date/Time:  Friday July 05 2019 20:34:35 EDT Ventricular  Rate:  67 PR Interval:    QRS Duration: 102 QT Interval:  385 QTC Calculation: 407 R Axis:   17 Text Interpretation:  Sinus rhythm Low voltage, precordial leads Baseline wander in lead(s) V2 No significant change since last tracing Confirmed by Drema Pry [phone]) on 07/06/2019 1:44:04 AM   Radiology Ct Head Wo Contrast  Result Date: 07/05/2019 CLINICAL DATA:  Headache, right head numbness and emesis EXAM: CT HEAD WITHOUT CONTRAST TECHNIQUE: Contiguous axial images were obtained from the base of the skull through the vertex without intravenous contrast. COMPARISON:  CT 09/22/2018 FINDINGS: Brain: No evidence of acute infarction, hemorrhage, hydrocephalus, extra-axial collection or mass lesion/mass effect. Vascular: No hyperdense vessel or unexpected calcification. Skull: No calvarial fracture or suspicious osseous lesion. No scalp swelling or hematoma. Sinuses/Orbits: Paranasal sinuses and mastoid air cells are predominantly clear. Included orbital structures are unremarkable. Other: None IMPRESSION: No acute intracranial abnormality Electronically Signed   By: Kreg Shropshire M.D.   On: 07/05/2019 21:02    Procedures Procedures (including critical care time)  Medications Ordered in ED Medications - No data to display   Initial Impression / Assessment and Plan / ED Course  I have reviewed the triage vital signs and the nursing notes.  Pertinent labs & imaging results that  were available during my care of the patient were reviewed by me and considered in my medical decision making (see chart for details).        53 year old female presents to the emergency department for evaluation of a headache with associated vomiting and hypertension.  Sent for further evaluation by her primary care doctor.  Laboratory evaluation and head CT are reassuring today.  Her neurologic exam is nonfocal.  Presently asymptomatic.  Reported to be hypertensive with systolic blood pressure in the 180s.  This has improved without intervention.  Case was discussed, as an aside, with her primary care doctor - Dr. Mikeal Hawthorne.  He states that he will follow-up with the patient in clinic and has called prescriptions into the pharmacy for her.  Patient is comfortable with discharge at this time.  She has been advised to return for any new or concerning symptoms.  Return precautions discussed and provided. Patient discharged in stable condition with no unaddressed concerns.   Final Clinical Impressions(s) / ED Diagnoses   Final diagnoses:  Hypertension not at goal  Bad headache    ED Discharge Orders    None       Antony Madura, PA-C 07/07/19 0010    Nira Conn, MD 07/07/19 [phone]

## 2020-10-31 ENCOUNTER — Other Ambulatory Visit: Payer: Self-pay

## 2020-10-31 ENCOUNTER — Encounter (HOSPITAL_COMMUNITY): Payer: Self-pay | Admitting: *Deleted

## 2020-10-31 DIAGNOSIS — R42 Dizziness and giddiness: Secondary | ICD-10-CM | POA: Diagnosis present

## 2020-10-31 DIAGNOSIS — Z5321 Procedure and treatment not carried out due to patient leaving prior to being seen by health care provider: Secondary | ICD-10-CM | POA: Diagnosis not present

## 2020-10-31 LAB — BASIC METABOLIC PANEL
Anion gap: 8 (ref 5–15)
BUN: 11 mg/dL (ref 6–20)
CO2: 29 mmol/L (ref 22–32)
Calcium: 8.7 mg/dL — ABNORMAL LOW (ref 8.9–10.3)
Chloride: 103 mmol/L (ref 98–111)
Creatinine, Ser: 0.76 mg/dL (ref 0.44–1.00)
GFR, Estimated: >60 mL/min (ref >60)
Glucose, Bld: 93 mg/dL (ref 70–99)
Potassium: 3.6 mmol/L (ref 3.5–5.1)
Sodium: 140 mmol/L (ref 135–145)

## 2020-10-31 LAB — URINALYSIS, ROUTINE W REFLEX MICROSCOPIC
Bilirubin Urine: NEGATIVE
Glucose, UA: NEGATIVE mg/dL
Hgb urine dipstick: NEGATIVE
Ketones, ur: NEGATIVE mg/dL
Leukocytes,Ua: NEGATIVE
Nitrite: NEGATIVE
Protein, ur: NEGATIVE mg/dL
Specific Gravity, Urine: 1.011 (ref 1.005–1.030)
pH: 7 (ref 5.0–8.0)

## 2020-10-31 LAB — CBC
HCT: 45.9 % (ref 36.0–46.0)
Hemoglobin: 15 g/dL (ref 12.0–15.0)
MCH: 28.5 pg (ref 26.0–34.0)
MCHC: 32.7 g/dL (ref 30.0–36.0)
MCV: 87.1 fL (ref 80.0–100.0)
Platelets: 338 10*3/uL (ref 150–400)
RBC: 5.27 MIL/uL — ABNORMAL HIGH (ref 3.87–5.11)
RDW: 13.5 % (ref 11.5–15.5)
WBC: 6.8 10*3/uL (ref 4.0–10.5)
nRBC: 0 % (ref 0.0–0.2)

## 2020-10-31 LAB — I-STAT BETA HCG BLOOD, ED (MC, WL, AP ONLY): I-stat hCG, quantitative: <5 m[IU]/mL (ref <5)

## 2020-10-31 LAB — CBG MONITORING, ED: Glucose-Capillary: 81 mg/dL (ref 70–99)

## 2020-10-31 NOTE — ED Triage Notes (Signed)
Pt has had dizziness since yesterday with pressure in ears, feels like something is crawling in her head. Daughter is translating.

## 2020-11-01 ENCOUNTER — Emergency Department (HOSPITAL_COMMUNITY)
Admission: EM | Admit: 2020-11-01 | Discharge: 2020-11-01 | Disposition: A | Discharge status: Home / Self Care | Payer: Medicaid Other | Attending: Emergency Medicine | Admitting: Emergency Medicine

## 2020-11-01 HISTORY — DX: Otitis media, unspecified, unspecified ear: H66.90

## 2020-11-21 ENCOUNTER — Emergency Department (HOSPITAL_COMMUNITY)
Admission: EM | Admit: 2020-11-21 | Discharge: 2020-11-21 | Disposition: A | Discharge status: Home / Self Care | Payer: Medicaid Other | Attending: Emergency Medicine | Admitting: Emergency Medicine

## 2020-11-21 ENCOUNTER — Other Ambulatory Visit: Payer: Self-pay

## 2020-11-21 ENCOUNTER — Emergency Department (HOSPITAL_COMMUNITY): Payer: Medicaid Other

## 2020-11-21 DIAGNOSIS — R202 Paresthesia of skin: Secondary | ICD-10-CM | POA: Insufficient documentation

## 2020-11-21 DIAGNOSIS — R519 Headache, unspecified: Secondary | ICD-10-CM | POA: Diagnosis present

## 2020-11-21 DIAGNOSIS — G932 Benign intracranial hypertension: Secondary | ICD-10-CM | POA: Diagnosis not present

## 2020-11-21 DIAGNOSIS — I10 Essential (primary) hypertension: Secondary | ICD-10-CM | POA: Diagnosis not present

## 2020-11-21 DIAGNOSIS — E039 Hypothyroidism, unspecified: Secondary | ICD-10-CM | POA: Insufficient documentation

## 2020-11-21 LAB — BASIC METABOLIC PANEL
Anion gap: 7 (ref 5–15)
BUN: 8 mg/dL (ref 6–20)
CO2: 26 mmol/L (ref 22–32)
Calcium: 8.8 mg/dL — ABNORMAL LOW (ref 8.9–10.3)
Chloride: 105 mmol/L (ref 98–111)
Creatinine, Ser: 0.73 mg/dL (ref 0.44–1.00)
GFR, Estimated: >60 mL/min (ref >60)
Glucose, Bld: 134 mg/dL — ABNORMAL HIGH (ref 70–99)
Potassium: 3.1 mmol/L — ABNORMAL LOW (ref 3.5–5.1)
Sodium: 138 mmol/L (ref 135–145)

## 2020-11-21 LAB — CBC
HCT: 41 % (ref 36.0–46.0)
Hemoglobin: 13.9 g/dL (ref 12.0–15.0)
MCH: 29 pg (ref 26.0–34.0)
MCHC: 33.9 g/dL (ref 30.0–36.0)
MCV: 85.4 fL (ref 80.0–100.0)
Platelets: 301 10*3/uL (ref 150–400)
RBC: 4.8 MIL/uL (ref 3.87–5.11)
RDW: 13.1 % (ref 11.5–15.5)
WBC: 5.6 10*3/uL (ref 4.0–10.5)
nRBC: 0 % (ref 0.0–0.2)

## 2020-11-21 IMAGING — MR MR HEAD W/O CM
12 of 13 series · 44 of 48 positions shown · non-contrast
Comparison: [DATE] brain MRI

CLINICAL DATA: Nonspecific dizziness

EXAM:
MRI HEAD WITHOUT CONTRAST
TECHNIQUE: Multiplanar, multiecho pulse sequences of the brain and surrounding
structures were obtained without intravenous contrast.

[Series 5: DWI · axial · 3.0mm · 0.88mm/px · z∈[-138,+6]mm · 8 of 100 slices shown (1 of 4)]
[im 1/100]
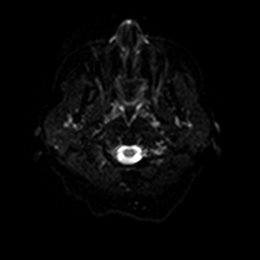
[im 15/100]
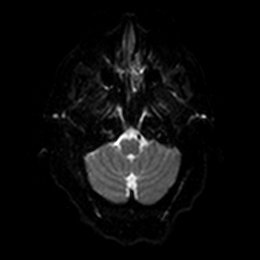
[im 29/100]
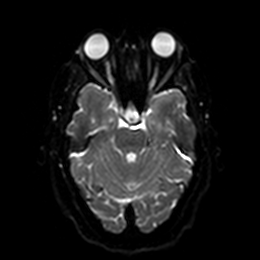
[im 43/100]
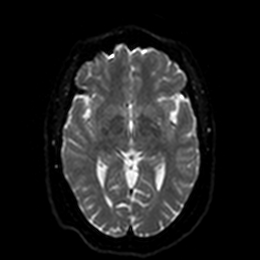
[im 57/100]
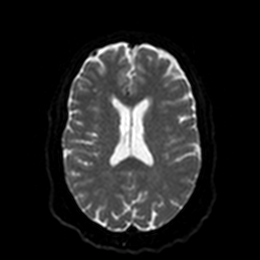
[im 71/100]
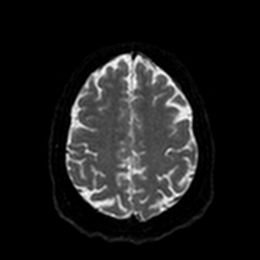
[im 85/100]
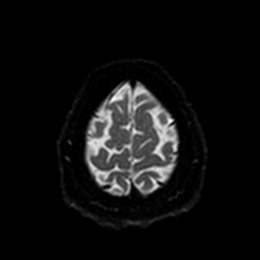
[im 100/100]
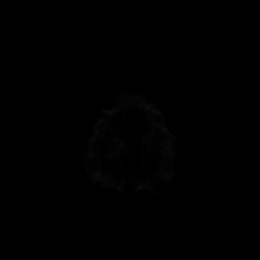

[Series 6: DWI · axial · 3.0mm · 0.88mm/px · z∈[-138,+6]mm · 4 of 50 slices shown (2 of 4)]
[im 1/50]
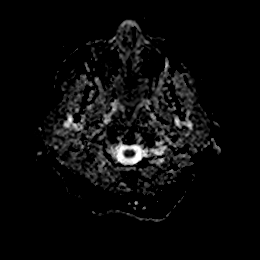
[im 17/50]
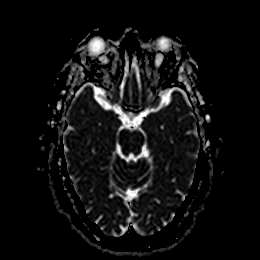
[im 33/50]
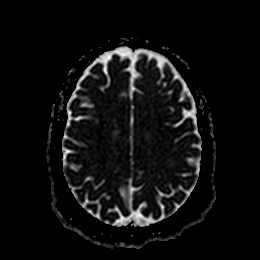
[im 50/50]
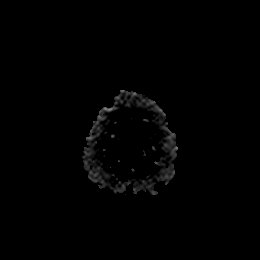

[Series 7: DWI · coronal · 4.0mm · 0.88mm/px · 5 of 70 slices shown (3 of 4)]
[im 1/70]
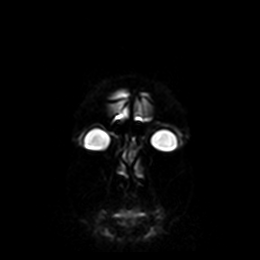
[im 18/70]
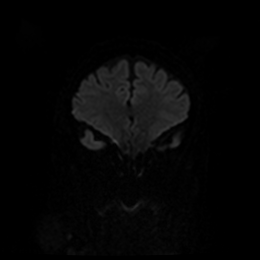
[im 35/70]
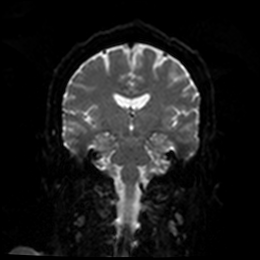
[im 52/70]
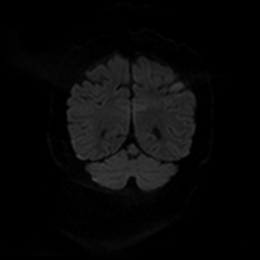
[im 70/70]
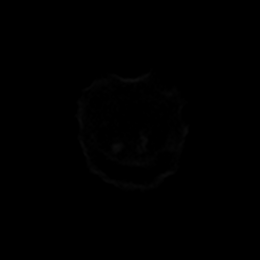

[Series 8: DWI · coronal · 4.0mm · 0.88mm/px · 3 of 35 slices shown (4 of 4)]
[im 1/35]
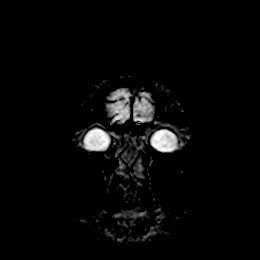
[im 18/35]
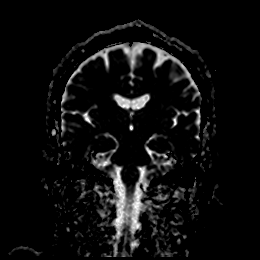
[im 35/35]
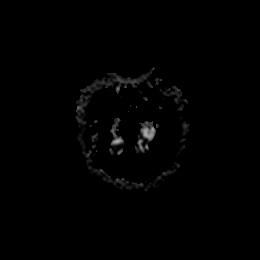

[Series 9: T1 · sagittal · 5.0mm · 0.75mm/px · 2 of 25 slices shown]
[im 1/25]
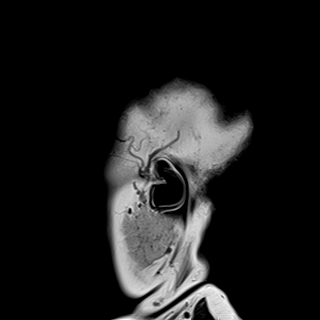
[im 25/25]
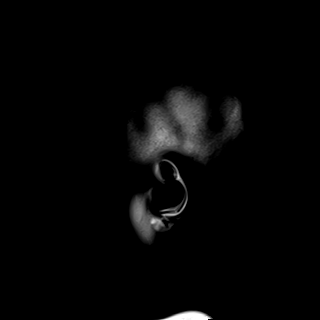

[Series 10: T2 · axial · 5.0mm · 0.72mm/px · z∈[-136,+5]mm · 2 of 25 slices shown (1 of 2)]
[im 1/25]
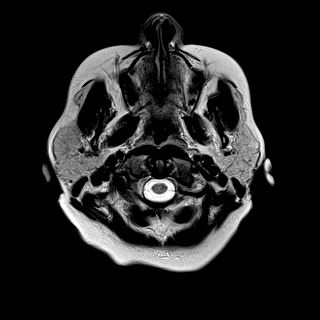
[im 25/25]
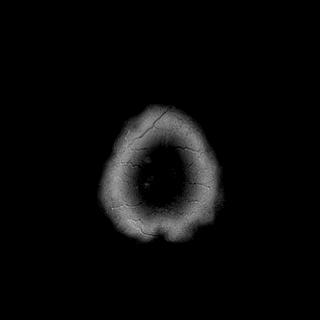

[Series 11: FLAIR · axial · 5.0mm · 0.90mm/px · z∈[-136,+5]mm · 2 of 25 slices shown]
[im 1/25]
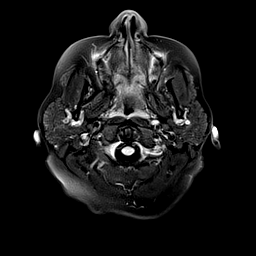
[im 25/25]
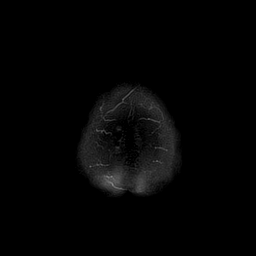

[Series 12: mag_images · axial · 3.0mm · 0.90mm/px · z∈[-138,+12]mm · 4 of 52 slices shown]
[im 1/52]
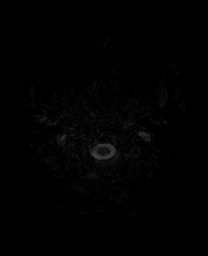
[im 18/52]
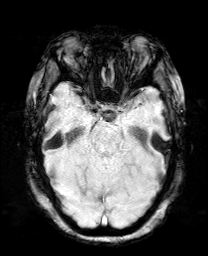
[im 35/52]
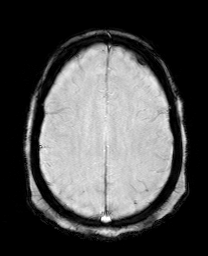
[im 52/52]
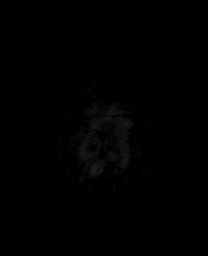

[Series 13: pha_images · axial · 3.0mm · 0.90mm/px · z∈[-138,+12]mm · 4 of 52 slices shown]
[im 1/52]
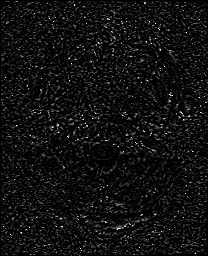
[im 18/52]
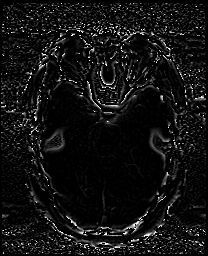
[im 35/52]
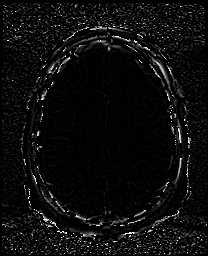
[im 52/52]
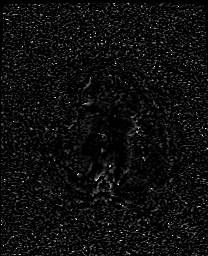

[Series 14: swi_images · axial · 3.0mm · 0.90mm/px · z∈[-138,+12]mm · 4 of 52 slices shown]
[im 1/52]
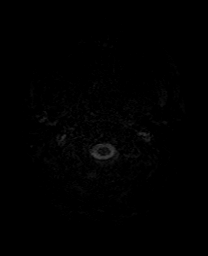
[im 18/52]
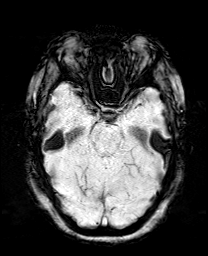
[im 35/52]
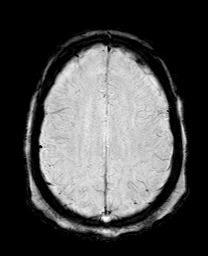
[im 52/52]
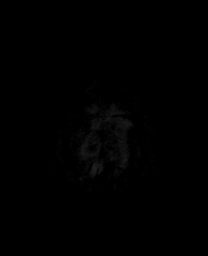

[Series 15: mip_images(sw) · axial · 24.0mm · 0.90mm/px · z∈[-128,+1]mm · 4 of 45 slices shown]
[im 1/45]
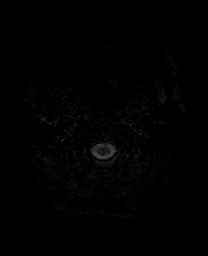
[im 15/45]
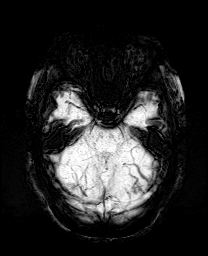
[im 30/45]
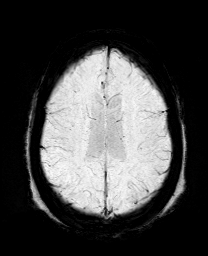
[im 45/45]
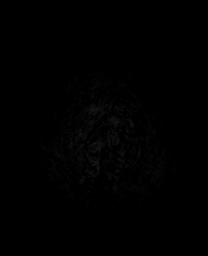

[Series 17: T2 · coronal · 5.0mm · 0.34mm/px · 2 of 30 slices shown (2 of 2)]
[im 1/30]
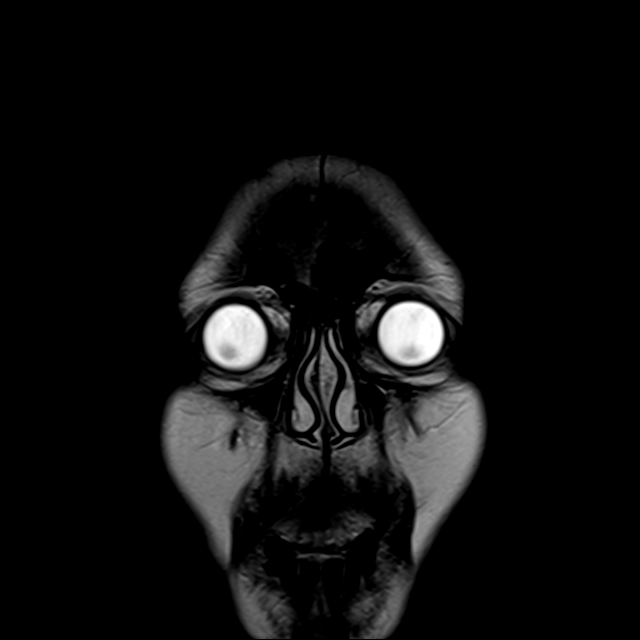
[im 30/30]
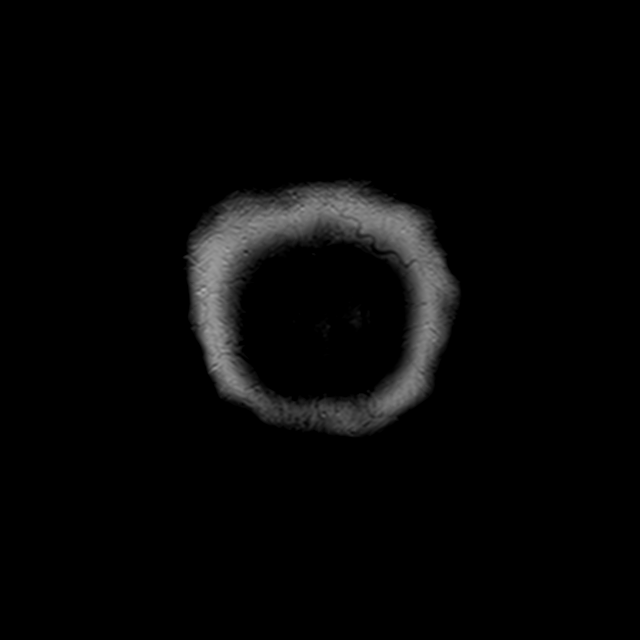

[44 of 48 positions shown; findings below may reference images not displayed]

FINDINGS: Brain: No acute infarction, hemorrhage, hydrocephalus, extra-axial
collection or mass lesion. Minimal FLAIR hyperintensity in the
cerebral white matter, mainly around the frontal horns of the
lateral ventricles and in the frontal white matter, similar to [1G]
and related to nonspecific remote insult. Partially empty sella
which could be related to chart history of pseudotumor.

Vascular: Normal flow voids

Skull and upper cervical spine: Normal marrow signal

Sinuses/Orbits: Negative
IMPRESSION: No acute finding or change from [1G].

## 2020-11-21 MED ORDER — ACETAZOLAMIDE 250 MG PO TABS: 500.0000 mg | ORAL_TABLET | Freq: Two times a day (BID) | ORAL | 0 refills | Status: DC

## 2020-11-21 NOTE — ED Provider Notes (Signed)
[facility] EMERGENCY DEPARTMENT Provider Note   CSN: [id number] Arrival date & time: 11/21/20  0012     History Chief Complaint  Patient presents with  . Headache    Janice Hanson is a 55 y.o. female.  The history is provided by the patient and a relative. A language interpreter was used [employer] [phone]).  Headache Pain location:  L parietal Quality: tingling. Radiates to:  Does not radiate Onset quality:  Gradual Duration:  1 day Timing:  Constant Progression:  Worsening Chronicity:  New Similar to prior headaches: yes   Relieved by:  Nothing Worsened by:  Nothing Associated symptoms: numbness and vomiting   Associated symptoms: no fever   Associated symptoms comment:  Patient blind at baseline she reports tingling in head for "awhile" and migraine for one day Patient has h/o pseudotumor with "fluid buildup"   Patient is requesting an MRI brain Past Medical History:  Diagnosis Date  . Abnormal finding in CSF   . Arthritis   . Blindness - both eyes    can see shadows w/ left eye  . GERD (gastroesophageal reflux disease)   . Helicobacter pylori gastritis 11/20/2014  . Hyperlipidemia   . Hypertension   . Hypothyroidism    pt denies  . Kidney stones   . Low blood pressure   . Middle ear infection   . Obesity   . Pseudomembranous colitis    hx of  . Pseudotumor cerebri    followed by Dr. Lucia Gaskins @ [facility]  . Right knee meniscal tear    w/ lateral plateau stress fx  . Syncope 10/31/2015    Patient Active Problem List   Diagnosis Date Noted  . Medial meniscus tear 10/10/2016  . Insufficiency fracture of tibia 10/10/2016  . Hereditary and idiopathic peripheral neuropathy 01/25/2016  . B12 deficiency 01/25/2016  . Pre-diabetes 01/25/2016  . Syncope 10/31/2015  . Helicobacter pylori gastritis 11/20/2014  . Chest pain, atypical 08/16/2014  . SOB (shortness of breath) 08/16/2014  . Obese 08/16/2014  . Pseudotumor cerebri  08/16/2014  . Blindness 08/16/2014  . Chest pain 08/16/2014  . GERD (gastroesophageal reflux disease) 08/16/2014    Past Surgical History:  Procedure Laterality Date  . KNEE ARTHROSCOPY Right 10/10/2016   Procedure: ARTHROSCOPY KNEE WITH PARTIAL MEDIAL MENISECTOMY AND LATERAL SUBCHONDROPLASTY;  Surgeon: Yolonda Kida, MD;  Location: [facility];  Service: Orthopedics;  Laterality: Right;  . TONSILLECTOMY       OB History   No obstetric history on file.     Family History  Problem Relation Age of Onset  . Diabetes Brother   . Heart disease Paternal Grandfather   . Kidney Stones Maternal Uncle   . Diabetes Maternal Grandmother   . Heart disease Maternal Grandmother   . Diabetes Sister   . Colon cancer Neg Hx     Social History   Tobacco Use  . Smoking status: Never Smoker  . Smokeless tobacco: Never Used  Vaping Use  . Vaping Use: Never used  Substance Use Topics  . Alcohol use: No    Alcohol/week: 0.0 standard drinks  . Drug use: No    Home Medications Prior to Admission medications   Medication Sig Start Date End Date Taking? Authorizing Provider  acetaZOLAMIDE (DIAMOX SEQUELS) 500 MG capsule Take 1 capsule (500 mg total) by mouth 2 (two) times daily. 05/04/19   Gilda Crease, MD  [facility] VITAMIN B12 1000 MCG tablet Take 1,000 mcg by mouth  daily. 02/09/19   [provider]  omeprazole (PRILOSEC) 40 MG capsule Take 40 mg by mouth daily.    [provider]  pantoprazole (PROTONIX) 40 MG tablet Take 1 tablet (40 mg total) by mouth daily. Patient not taking: Reported on 09/22/2018 03/03/17   Iva Boop, MD  VITAMIN D PO Take 1 tablet by mouth daily.    [provider]    Allergies    Patient has no known allergies.  Review of Systems   Review of Systems  Constitutional: Negative for fever.  Eyes:       Patient blind   Respiratory: Negative for shortness of breath.   Cardiovascular: Negative for chest  pain.  Gastrointestinal: Positive for vomiting.  Neurological: Positive for numbness and headaches.  All other systems reviewed and are negative.   Physical Exam Updated Vital Signs BP (!) 108/54 (BP Location: Right Arm)   Pulse 65   Temp 98.5 F (36.9 C) (Oral)   Resp 16   SpO2 99%   Physical Exam CONSTITUTIONAL: Well developed/well nourished HEAD: Normocephalic/atraumatic EYES: EOMI/PERRL, no nystagmus, no ptosis, no obvious papilledema  ENMT: Mucous membranes moist NECK: supple no meningeal signs, no bruits SPINE/BACK:entire spine nontender CV: S1/S2 noted, no murmurs/rubs/gallops noted LUNGS: Lungs are clear to auscultation bilaterally, no apparent distress ABDOMEN: soft, nontender, no rebound or guarding GU:no cva tenderness NEURO:Awake/alert, face symmetric, no arm or leg drift is noted Equal 5/5 strength with shoulder abduction, elbow flex/extension, wrist flex/extension in upper extremities and equal hand grips bilaterally Equal 5/5 strength with hip flexion,knee flex/extension, foot dorsi/plantar flexion Cranial nerves 3/4/5/6/05/01/09/11/12 tested and intact Sensation to light touch intact in all extremities EXTREMITIES: pulses normal, full ROM SKIN: warm, color normal PSYCH: no abnormalities of mood noted, alert and oriented to situation   ED Results / Procedures / Treatments   Labs (all labs ordered are listed, but only abnormal results are displayed) Labs Reviewed  BASIC METABOLIC PANEL - Abnormal; Notable for the following components:      Result Value   Potassium 3.1 (*)    Glucose, Bld 134 (*)    Calcium 8.8 (*)    All other components within normal limits  CBC    EKG None  Radiology MR BRAIN WO CONTRAST  Result Date: 11/21/2020 CLINICAL DATA:  Nonspecific dizziness EXAM: MRI HEAD WITHOUT CONTRAST TECHNIQUE: Multiplanar, multiecho pulse sequences of the brain and surrounding structures were obtained without intravenous contrast. COMPARISON:   11/03/2015 brain MRI FINDINGS: Brain: No acute infarction, hemorrhage, hydrocephalus, extra-axial collection or mass lesion. Minimal FLAIR hyperintensity in the cerebral white matter, mainly around the frontal horns of the lateral ventricles and in the frontal white matter, similar to 2017 and related to nonspecific remote insult. Partially empty sella which could be related to chart history of pseudotumor. Vascular: Normal flow voids Skull and upper cervical spine: Normal marrow signal Sinuses/Orbits: Negative IMPRESSION: No acute finding or change from 2017. Electronically Signed   By: Marnee Spring M.D.   On: 11/21/2020 05:16    Procedures .Lumbar Puncture  Date/Time: 11/21/2020 6:25 AM Performed by: Zadie Rhine, MD Authorized by: Zadie Rhine, MD   Consent:    Consent obtained:  Written and verbal   Consent given by:  Patient (daughter)   Risks, benefits, and alternatives were discussed: yes     Risks discussed:  Bleeding, infection, headache and pain   Alternatives discussed:  No treatment Universal protocol:    Patient identity confirmed:  Verbally with patient and provided  demographic data Pre-procedure details:    Procedure purpose:  Therapeutic   Preparation: Patient was prepped and draped in usual sterile fashion   Anesthesia:    Anesthesia method:  Local infiltration   Local anesthetic:  Lidocaine 1% w/o epi Procedure details:    Lumbar space:  L3-L4 interspace   Patient position:  L lateral decubitus   Needle gauge:  20   Number of attempts:  2   Opening pressure (cm H2O):  23   Closing pressure (cm H2O):  14   Fluid appearance:  Blood-tinged then clearing   Total volume (ml):  15 Post-procedure details:    Puncture site:  Adhesive bandage applied and direct pressure applied   Procedure completion:  Tolerated well, no immediate complications     Medications Ordered in ED Medications - No data to display  ED Course  I have reviewed the triage vital  signs and the nursing notes.  Pertinent labs & imaging results that were available during my care of the patient were reviewed by me and considered in my medical decision making (see chart for details).    MDM Rules/Calculators/A&P                          3:02 AM Patient with known history of pseudotumor that has required therapeutic lumbar puncture in the past presents with headache and reported numbness throughout her head, face and neck Patient has not seen neurology anytime recently and apparently is not taking Diamox.  However she reports her PCP did want her to have an MRI.  She reports the headaches are similar to prior episodes, but her biggest concern is the new numbness throughout her head face and neck Patient is in no acute distress.  No obvious neuro deficits on initial exam.  She is afebrile We will proceed with MRI due to new neuro symptoms of numbness, and then will likely require therapeutic lumbar puncture.  Patient will need neurology follow-up 6:27 AM MRI negative for acute stroke.  Patient is ambulatory with assistance, has lower extremity pain that is chronic. After full discussion of risk and benefits, patient agrees to proceed with lumbar puncture.  Patient tolerated this very well and we were able to take her opening pressure from 23 to around 14 closing pressure Patient reports she is already feeling improved.  She denies any fevers, no meningeal sign, low suspicion for meningitis.  We will not send any diagnostic CSF studies. I stressed the importance of starting Diamox and follow-up with neurology.  Ambulatory referral has been sent to her neurology team.  Final Clinical Impression(s) / ED Diagnoses Final diagnoses:  IIH (idiopathic intracranial hypertension)    Rx / DC Orders ED Discharge Orders         Ordered    Ambulatory referral to Neurology       Comments: An appointment is requested in approximately: 2 weeks   11/21/20 0335    acetaZOLAMIDE (DIAMOX)  250 MG tablet  2 times daily        11/21/20 3536           Zadie Rhine, MD 11/21/20 [phone]

## 2020-11-21 NOTE — ED Notes (Signed)
Patient transported to MRI 

## 2020-11-21 NOTE — ED Triage Notes (Signed)
Pt presents to ED BIB [facility]. Pt c/o HA, dizziness, and emesis. Pt reports that her s/s have been persistent for a while and PCP wants her to get MRI. NIH - 0, AAO x4

## 2020-11-21 NOTE — ED Notes (Signed)
[id number] muhamed husband wants updates

## 2020-12-03 ENCOUNTER — Other Ambulatory Visit: Payer: Self-pay

## 2020-12-03 ENCOUNTER — Ambulatory Visit: Payer: Medicaid Other | Admitting: Neurology

## 2020-12-03 ENCOUNTER — Encounter: Payer: Self-pay | Admitting: Neurology

## 2020-12-03 VITALS — BP 116/80 | HR 68 | Ht 67.0 in | Wt 267.0 lb

## 2020-12-03 DIAGNOSIS — M5481 Occipital neuralgia: Secondary | ICD-10-CM | POA: Diagnosis not present

## 2020-12-03 DIAGNOSIS — M62838 Other muscle spasm: Secondary | ICD-10-CM

## 2020-12-03 DIAGNOSIS — H4720 Unspecified optic atrophy: Secondary | ICD-10-CM | POA: Diagnosis not present

## 2020-12-03 DIAGNOSIS — G43009 Migraine without aura, not intractable, without status migrainosus: Secondary | ICD-10-CM

## 2020-12-03 DIAGNOSIS — G932 Benign intracranial hypertension: Secondary | ICD-10-CM

## 2020-12-03 MED ORDER — TOPIRAMATE 50 MG PO TABS: 50.0000 mg | ORAL_TABLET | Freq: Every day | ORAL | 6 refills | Status: DC

## 2020-12-03 NOTE — Progress Notes (Addendum)
[facility]    Provider: Dr Lucia Gaskins Referring Provider: Rometta Emery, MD Primary Care Physician: Lonia Blood, MD  CC: IDIOPATHIC INTRACRANIAL HYPERTENSION  HPI: This is a 55 year old patient with a past medical history of remote IIH with secondary optic nerve atrophy, we saw her back in 2017, at that time she was no longer on Diamox and multiple LPs had show normal opening pressures.  LPs had been performed over a year for surveillance.  In 2017 she denied any recent headaches or new vision changes.  Daughter provides most information and interpretation, she feels numbness in the back of the head, tingling and pressure in her ears, neck pain, muscle tightness, The headaches are similar to prior but the new symptoms stared in November after an ear infection, she saw ENT and they said no infection, she has a hearing test upcoming, she has numbness in her leg in the foot, they did an xray no tear or break, no back pain. She has pain in her foot where she hit it in the heel, she has been evaluated. Discussed PT. She felt better after the fluid was taken off in the ED, she does not have a lot of headaches since,no new vision changes.     MRI brain 11/21/2020: Brain: No acute infarction, hemorrhage, hydrocephalus, extra-axial collection or mass lesion. Minimal FLAIR hyperintensity in the cerebral white matter, mainly around the frontal horns of the lateral ventricles and in the frontal white matter, similar to 2017 and related to nonspecific remote insult. Partially empty sella which could be related to chart history of pseudotumor.  Vascular: Normal flow voids  Skull and upper cervical spine: Normal marrow signal  Sinuses/Orbits: Negative  IMPRESSION: No acute finding or change from 2017. (personally reviewed and agree with findings)  I reviewed Dr. Nelda Marseille notes patient was seen for headache in the emergency room at the end of last month, left parietal,  tingling, gradual, similar to prior headaches, associated with numbness and vomiting, migrainous, she also reported numbness throughout her head face and neck.  She requested an MRI which was stable.  Examination was nonfocal including neurologic.  Opening pressure was 23 and closing pressure was 14 and patient felt improved.   Interval update 01/25/2016: 55 year old patient with a past medical history IIH with secondary optic nerve atrophy. She is no longer on Diamox recent LPs have showed normal opening pressure. LPs performed every year for surveillance. She denies any recent headaches or new vision changes. Today she describes that her feet are getting smaller and she is having numbness in her toes. She also complains of muscle pain and joint pain today.  Labs tested for paresthesias at last appointment:  Ace, HIv, sed rate, ANA, rpr, b1, thyroid, hep c, heavy metals, IFE, crp, lyme. Also tested NMO antibodies which were negative.  b12 was low, 218 and she has not taken supplements despite being advised to. Needs to take 2000 g oral B12 daily for B12 deficiency. Discussed that B12 deficiency can cause numbness and paresthesias as well as dementia. hgba1c 6.3, discussed again. Diet, exercise and follow up with primary care  Will repeat lumbar puncure as well. [place]. Would like LP next week.   Interval Update 11/04/2015: She has a weird feeling in the back of her right knee. It is very painful. Her toes are "separating". No tingling or burning in the feet. But the toes are "separating". She has pain on the top of the feet when she  presses on it. No numbness, no burning, no tingling. She had tingling last time but it has gone away. Pain in the knee started 1 month - 3 weeks ago. She has had 3 headaches in the last month. They last a day or two. She has some shooting pain in the feet and tingling. She snores at night. But not excessively and not excessively tired during  the day. Sleep eval has been normal in the past    HPI: Janice Hanson is a 55 y.o. female here as a referral from Dr. Mikeal Hawthorne for pseudotumor cerebri. She has had LPs with opening pressure of 26 in July 2015 and opening pressure of 19.5 in June of 2016. Her daughter is an interpreter and they decline any cone interpreters and sign a release form. Husband also in the room and provides information. IIH started in 1994 with severe headaches, vision changes, then complete blindness in both eyes. She has been treated with different steroid injections. She was treated with diamox. She was on diamox for 2 months and then stopped. She stopped the Diamox on her own because it was making her urinate. She stopped the diamox in 2002 and has not been treated since then. She was seen years ago by Dr. Lissa Morales in the old building for at least 5 years. Vision never improved. She can see some light in the left eye. Little vision in the right eye. Now she gets yearly LPs to check the opening pressure. She has been to the emergency room three times in the last few weeks for severe hypotension, slurred speech, left-sided weakness. She is having headaches in the back of her head. Like someone is banging on her head. Like a buzzing the right side of the head twice. She snores at night. She has gained weight. She is excessively tired during the day, very sleepy. She wakes up with morning headaches. Her feet has needles in the feet. Her toes are separated. However her Epworth sleepiness scale is only a 1. She reports tingling in the feet. No other focal neurologic deficits.  Reviewed notes, labs and imaging from outside physicians, which showed:  CLINICAL DATA: Headache and fatigue ; nausea and vomiting. Left-sided numbness and slurred speech  EXAM: CT HEAD WITHOUT CONTRAST  TECHNIQUE: Contiguous axial images were obtained from the base of the skull through the vertex without intravenous contrast.  COMPARISON: April 17, 2015  CT of the head and 10/13/2015 (personally reviewed images and agree with the following): The ventricles are normal in size and configuration. There is no intracranial mass hemorrhage, extra-axial fluid collection, or midline shift. Gray-white compartments are normal. There is no acute infarct evident. The bony calvarium appears intact. The mastoid air cells are clear. Visualized orbits appear normal.  IMPRESSION: Study within normal limits and stable.  Review of Systems: Patient complains of symptoms per HPI as well as the following symptoms: headache, neck pain, right foot pain . Pertinent negatives and positives per HPI. All others negative    Social History   Socioeconomic History  . Marital status: Married    Spouse name: Not on file  . Number of children: 4  . Years of education: Not on file  . Highest education level: Not on file  Occupational History  . Occupation: na  Tobacco Use  . Smoking status: Never Smoker  . Smokeless tobacco: Never Used  Vaping Use  . Vaping Use: Never used  Substance and Sexual Activity  . Alcohol use: No  Alcohol/week: 0.0 standard drinks  . Drug use: No  . Sexual activity: Not on file  Other Topics Concern  . Not on file  Social History Narrative   Married originally from [place]   4 children 2 girls/2 boys   Caffeine use: 1-2 cups tea daily   Lives with husband and daughter   Social Determinants of Health   Financial Resource Strain: Not on file  Food Insecurity: Not on file  Transportation Needs: Not on file  Physical Activity: Not on file  Stress: Not on file  Social Connections: Not on file  Intimate Partner Violence: Not on file    Family History  Problem Relation Age of Onset  . Diabetes Brother   . Heart disease Paternal Grandfather   . Kidney Stones Maternal Uncle   . Diabetes Maternal Grandmother   . Heart disease Maternal Grandmother   . Diabetes Sister   . High blood pressure Other   . Colon cancer  Neg Hx   . Pseudotumor cerebri Neg Hx     Past Medical History:  Diagnosis Date  . Abnormal finding in CSF   . Arthritis   . Blindness - both eyes 12/1992   can see shadows w/ left eye  . GERD (gastroesophageal reflux disease)   . Helicobacter pylori gastritis 11/20/2014  . Hyperlipidemia   . Hypertension   . Hypothyroidism    pt denies  . Kidney stones   . Low blood pressure   . Middle ear infection   . Obesity   . Pseudomembranous colitis    hx of  . Pseudotumor cerebri    followed by Dr. Lucia Gaskins @ [facility]  . Right knee meniscal tear    w/ lateral plateau stress fx  . Syncope 10/31/2015    Past Surgical History:  Procedure Laterality Date  . KNEE ARTHROSCOPY Right 10/10/2016   Procedure: ARTHROSCOPY KNEE WITH PARTIAL MEDIAL MENISECTOMY AND LATERAL SUBCHONDROPLASTY;  Surgeon: Yolonda Kida, MD;  Location: [facility];  Service: Orthopedics;  Laterality: Right;  . TONSILLECTOMY      Current Outpatient Medications  Medication Sig Dispense Refill  . Cyanocobalamin (VITAMIN B-12 PO) Take by mouth.    Marland Kitchen omeprazole (PRILOSEC) 40 MG capsule Take 40 mg by mouth daily.    Marland Kitchen PRESCRIPTION MEDICATION Take 5 mg by mouth daily. Patient on BP medication. Daughter is pretty sure it's Amlodipine    . topiramate (TOPAMAX) 50 MG tablet Take 1 tablet (50 mg total) by mouth at bedtime. 30 tablet 6   Current Facility-Administered Medications  Medication Dose Route Frequency Provider Last Rate Last Admin  . 0.9 %  sodium chloride infusion  500 mL Intravenous Continuous Iva Boop, MD        Allergies as of 12/03/2020  . (No Known Allergies)    Vitals: BP 116/80 (BP Location: Left Arm, Patient Position: Sitting, Cuff Size: Large)   Pulse 68   Ht 5\' 7"  (1.702 m)   Wt 267 lb (121.1 kg)   BMI 41.82 kg/m  Last Weight:  Wt Readings from Last 1 Encounters:  12/03/20 267 lb (121.1 kg)   Last Height:   Ht Readings from Last 1 Encounters:  12/03/20  5\' 7"  (1.702 m)      Physical exam: Exam: Gen: NAD, conversant, well nourised, obese, well groomed                     CV: RRR, no MRG. No Carotid Bruits. No peripheral  edema, warm, nontender Eyes: Conjunctivae clear without exudates or hemorrhage  Neuro: Detailed Neurologic Exam  Speech:    Speech is normal; fluent and spontaneous with normal comprehension.  Cognition:    The patient is oriented to person, place, and time;     recent and remote memory intact;     language fluent;     normal attention, concentration,     fund of knowledge  Cranial Nerves:    The pupils are equal, round, and reactive to light. Optic nerve pallor and atrophy bilaterally. Can only see shadows with the right eye, can see waving hands on the left periphery and fingers left centrally (stable from my last visit) . Extraocular movements are intact. Trigeminal sensation is intact and the muscles of mastication are normal. The face is symmetric. The palate elevates in the midline. Hearing intact. Voice is normal. Shoulder shrug is normal. The tongue has normal motion without fasciculations.   Coordination:    No dysmetria or ataxia   Gait:  no ataxia, slightly wide based   Motor Observation:    No asymmetry, no atrophy, and no involuntary movements noted. Tone:    Normal muscle tone.    Posture:    Posture is normal. normal erect    Strength:    Strength is V/V in the upper and lower limbs.      Sensation: intact to LT, pinprick distally     Reflex Exam:  DTR's:    Deep tendon reflexes in the upper and lower extremities are symmetrical bilaterally.   Toes:    The toes are downgoing bilaterally.   Clonus:    Clonus is absent.  Assessment/Plan:  55 year old female with past medical history with a history of blindness reportedly secondary to pseudotumor cerebri. She has not been on diamox for years, multiple LPs were normal, most recent LP was OP 23. She reports some headaches may be  migrainous will start Topiramate which could help with both disorders however I do think the IDIOPATHIC INTRACRANIAL HYPERTENSION has been resolved for many years.   - She has been evaluated by ophthalmology. Dr. Dione Booze recommended NMO lab testing, normal, will ask she returns to him for yearly eval -MRI of the brain recent:  Nonspecific white matter changes and enlarged partially empty sella. No acute events. - Ortho and Podiatrist for foot pain left ankle after injury, sensory exam normal, no symptoms of peripheral neuropathy.  - - PT for neck pain and what sounds like occipital neuralgia  -Labs tested for paresthesias in the past:  Ace, HIv, sed rate, ANA, rpr, b1, thyroid, hep c, heavy metals, IFE, crp, lyme. Also tested NMO antibodies which were negative.  - B12 was low in the past, 218 and she has not taken supplements despite being advised to. Needs to take 2000 mcg oral B12 daily for B12 deficiency. Discussed that B12 deficiency again and can cause numbness and paresthesias as well as dementia. Follow with primary care. - hgba1c was elevated in the past, discussed again. Diet, exercise and follow up with primary care  Orders Placed This Encounter  Procedures  . Ambulatory referral to Ophthalmology  . Ambulatory referral to Physical Therapy   Meds ordered this encounter  Medications  . topiramate (TOPAMAX) 50 MG tablet    Sig: Take 1 tablet (50 mg total) by mouth at bedtime.    Dispense:  30 tablet    Refill:  6    To prevent or relieve headaches, try the following:  Cool Compress.  Lie down and place a cool compress on your head.   Avoid headache triggers. If certain foods or odors seem to have triggered your migraines in the past, avoid them. A headache diary might help you identify triggers.   Include physical activity in your daily routine. Try a daily walk or other moderate aerobic exercise.   Manage stress. Find healthy ways to cope with the stressors, such as  delegating tasks on your to-do list.   Practice relaxation techniques. Try deep breathing, yoga, massage and visualization.   Eat regularly. Eating regularly scheduled meals and maintaining a healthy diet might help prevent headaches. Also, drink plenty of fluids.   Follow a regular sleep schedule. Sleep deprivation might contribute to headaches  Consider biofeedback. With this mind-body technique, you learn to control certain bodily functions -- such as muscle tension, heart rate and blood pressure -- to prevent headaches or reduce headache pain.   Proceed to emergency room if you experience new or worsening symptoms or symptoms do not resolve, if you have new neurologic symptoms or if headache is severe, or for any concerning symptom.     Naomie DeanAntonia Danajah Birdsell, MD  [facility] [address] [place] [id number]  Phone [phone] Fax [phone]   I spent over 60 minutes of face-to-face and non-face-to-face time with patient on the  1. IIH (idiopathic intracranial hypertension)   2. Migraine without aura and without status migrainosus, not intractable   3. Optic nerve atrophy, bilateral   4. Bilateral occipital neuralgia   5. Neck muscle spasm    diagnosis.  This included previsit chart review, lab review, study review, order entry, electronic health record documentation, patient education on the different diagnostic and therapeutic options, counseling and coordination of care, risks and benefits of management, compliance, or risk factor reduction

## 2020-12-03 NOTE — Patient Instructions (Addendum)
Physical Therapy Start Topiramate Opthalmology  Occipital neuralgia    Occipital Neuralgia  Occipital neuralgia is a type of headache that causes brief episodes of very bad pain in the back of the head. Pain from occipital neuralgia may spread (radiate) to other parts of the head. These headaches may be caused by irritation of the nerves that leave the spinal cord high up in the neck, just below the base of the skull (occipital nerves). The occipital nerves transmit sensations from the back of the head, the top of the head, and the areas behind the ears. What are the causes? This condition can occur without any known cause (primary headache syndrome). In other cases, this condition is caused by pressure on or irritation of one of the two occipital nerves. Pressure and irritation may be due to:  Muscle spasm in the neck.  Neck injury.  Wear and tear of the vertebrae in the neck (osteoarthritis).  Disease of the disks that separate the vertebrae.  Swollen blood vessels that put pressure on the occipital nerves.  Infections.  Tumors.  Diabetes. What are the signs or symptoms? This condition causes brief burning, stabbing, electric, shocking, or shooting pain in the back of the head that can radiate to the top of the head. It can happen on one side or both sides of the head. It can also cause:  Pain behind the eye.  Pain triggered by neck movement or hair brushing.  Scalp tenderness.  Aching in the back of the head between episodes of very bad pain.  Pain that gets worse with exposure to bright lights. How is this diagnosed? Your health care provider may diagnose the condition based on a physical exam and your symptoms. Tests may be done, such as:  Imaging studies of the brain and neck (cervical spine), such as an MRI or CT scan. These look for causes of pinched nerves.  Applying pressure to the nerves in the neck to try to re-create the pain.  Injection of numbing  medicine into the occipital nerve areas to see if pain goes away (diagnostic nerve block).   How is this treated? Treatment for this condition may begin with simple measures, such as:  Rest.  Massage.  Applying heat or cold to the area.  Over-the-counter pain relievers. If these measures do not work, you may need other treatments, including:  Medicines, such as: ? Prescription-strength anti-inflammatory medicines. ? Muscle relaxants. ? Anti-seizure medicines, which can relieve pain. ? Antidepressants, which can relieve pain. ? Injected medicines, such as medicines that numb the area (local anesthetic) and steroids.  Pulsed radiofrequency ablation. This is when wires are implanted to deliver electrical impulses that block pain signals from the occipital nerve.  Surgery to relieve nerve pressure.  Physical therapy. Follow these instructions at home: Managing pain  Avoid any activities that cause pain.  Rest when you have an attack of pain.  Try gentle massage to relieve pain.  Try a different pillow or sleeping position.  If directed, apply heat to the affected area as often as told by your health care provider. Use the heat source that your health care provider recommends, such as a moist heat pack or a heating pad. ? Place a towel between your skin and the heat source. ? Leave the heat on for 20-30 minutes. ? Remove the heat if your skin turns bright red. This is especially important if you are unable to feel pain, heat, or cold. You have a greater risk of getting  burned.  If directed, put ice on the back of your head and neck area. To do this: ? Put ice in a plastic bag. ? Place a towel between your skin and the bag. ? Leave the ice on for 20 minutes, 2-3 times a day. ? Remove the ice if your skin turns bright red. This is very important. If you cannot feel pain, heat, or cold, you have a greater risk of damage to the area.      General instructions  Take  over-the-counter and prescription medicines only as told by your health care provider.  Avoid things that make your symptoms worse, such as bright lights.  Try to stay active. Get regular exercise that does not cause pain. Ask your health care provider to suggest safe exercises for you.  Work with a physical therapist to learn stretching exercises you can do at home.  Practice good posture.  Keep all follow-up visits. This is important. Contact a health care provider if:  Your medicine is not working.  You have new or worsening symptoms. Get help right away if:  You have very bad head pain that does not go away.  You have a sudden change in vision, balance, or speech. These symptoms may represent a serious problem that is an emergency. Do not wait to see if the symptoms will go away. Get medical help right away. Call your local emergency services (911 in the [place] Do not drive yourself to the hospital. Summary  Occipital neuralgia is a type of headache that causes brief episodes of very bad pain in the back of the head.  Pain from occipital neuralgia may spread (radiate) to other parts of the head.  Treatment for this condition includes rest, massage, and medicines. This information is not intended to replace advice given to you by your health care provider. Make sure you discuss any questions you have with your health care provider. Document Revised: 08/09/2020 Document Reviewed: 08/09/2020 Elsevier Patient Education  2021 Elsevier Inc.  Topiramate tablets What is this medicine? TOPIRAMATE (toe PYRE a mate) is used for the prevention of migraine headaches and IDIOPATHIC INTRACRANIAL HYPERTENSION(Pseudotumor Cerebri). This medicine may be used for other purposes; ask your health care provider or pharmacist if you have questions. COMMON BRAND NAME(S): Topamax, Topiragen What should I tell my health care provider before I take this medicine? They need to know if you have any of  these conditions:  bleeding disorder  kidney disease  lung disease  suicidal thoughts, plans, or attempt  an unusual or allergic reaction to topiramate, other medicines, foods, dyes, or preservatives  pregnant or trying to get pregnant  breast-feeding How should I use this medicine? Take this medicine by mouth with a glass of water. Follow the directions on the prescription label. Do not cut, crush or chew this medicine. Swallow the tablets whole. You can take it with or without food. If it upsets your stomach, take it with food. Take your medicine at regular intervals. Do not take it more often than directed. Do not stop taking except on your doctor's advice. A special MedGuide will be given to you by the pharmacist with each prescription and refill. Be sure to read this information carefully each time. Talk to your pediatrician regarding the use of this medicine in children. While this drug may be prescribed for children as young as 50 years of age for selected conditions, precautions do apply. Overdosage: If you think you have taken too much of this medicine  contact a poison control center or emergency room at once. NOTE: This medicine is only for you. Do not share this medicine with others. What if I miss a dose? If you miss a dose, take it as soon as you can. If your next dose is to be taken in less than 6 hours, then do not take the missed dose. Take the next dose at your regular time. Do not take double or extra doses. What may interact with this medicine? This medicine may interact with the following medications:  acetazolamide  alcohol  antihistamines for allergy, cough, and cold  aspirin and aspirin-like medicines  atropine  birth control pills  certain medicines for anxiety or sleep  certain medicines for bladder problems like oxybutynin, tolterodine  certain medicines for depression like amitriptyline, fluoxetine, sertraline  certain medicines for seizures like  carbamazepine, phenobarbital, phenytoin, primidone, valproic acid, zonisamide  certain medicines for stomach problems like dicyclomine, hyoscyamine  certain medicines for travel sickness like scopolamine  certain medicines for Parkinson's disease like benztropine, trihexyphenidyl  certain medicines that treat or prevent blood clots like warfarin, enoxaparin, dalteparin, apixaban, dabigatran, and rivaroxaban  digoxin  general anesthetics like halothane, isoflurane, methoxyflurane, propofol  hydrochlorothiazide  ipratropium  lithium  medicines that relax muscles for surgery  metformin  narcotic medicines for pain  NSAIDs, medicines for pain and inflammation, like ibuprofen or naproxen  phenothiazines like chlorpromazine, mesoridazine, prochlorperazine, thioridazine  pioglitazone This list may not describe all possible interactions. Give your health care provider a list of all the medicines, herbs, non-prescription drugs, or dietary supplements you use. Also tell them if you smoke, drink alcohol, or use illegal drugs. Some items may interact with your medicine. What should I watch for while using this medicine? Visit your doctor or health care professional for regular checks on your progress. Tell your health care professional if your symptoms do not start to get better or if they get worse. Do not stop taking except on your health care professional's advice. You may develop a severe reaction. Your health care professional will tell you how much medicine to take. Wear a medical ID bracelet or chain. Carry a card that describes your disease and details of your medicine and dosage times. This medicine can reduce the response of your body to heat or cold. Dress warm in cold weather and stay hydrated in hot weather. If possible, avoid extreme temperatures like saunas, hot tubs, very hot or cold showers, or activities that can cause dehydration such as vigorous exercise. Check with your  health care professional if you have severe diarrhea, nausea, and vomiting, or if you sweat a lot. The loss of too much body fluid may make it dangerous for you to take this medicine. You may get drowsy or dizzy. Do not drive, use machinery, or do anything that needs mental alertness until you know how this medicine affects you. Do not stand up or sit up quickly, especially if you are an older patient. This reduces the risk of dizzy or fainting spells. Alcohol may interfere with the effect of this medicine. Avoid alcoholic drinks. Tell your health care professional right away if you have any change in your eyesight. Patients and their families should watch out for new or worsening depression or thoughts of suicide. Also watch out for sudden changes in feelings such as feeling anxious, agitated, panicky, irritable, hostile, aggressive, impulsive, severely restless, overly excited and hyperactive, or not being able to sleep. If this happens, especially at the beginning of  treatment or after a change in dose, call your healthcare professional. This medicine may cause serious skin reactions. They can happen weeks to months after starting the medicine. Contact your health care provider right away if you notice fevers or flu-like symptoms with a rash. The rash may be red or purple and then turn into blisters or peeling of the skin. Or, you might notice a red rash with swelling of the face, lips or lymph nodes in your neck or under your arms. Birth control may not work properly while you are taking this medicine. Talk to your health care professional about using an extra method of birth control. Women should inform their health care professional if they wish to become pregnant or think they might be pregnant. There is a potential for serious side effects and harm to an unborn child. Talk to your health care professional for more information. What side effects may I notice from receiving this medicine? Side effects  that you should report to your doctor or health care professional as soon as possible:  allergic reactions like skin rash, itching or hives, swelling of the face, lips, or tongue  blood in the urine  changes in vision  confusion  loss of memory  pain in lower back or side  pain when urinating  redness, blistering, peeling or loosening of the skin, including inside the mouth  signs and symptoms of bleeding such as bloody or black, tarry stools; red or dark brown urine; spitting up blood or brown material that looks like coffee grounds; red spots on the skin; unusual bruising or bleeding from the eyes, gums, or nose  signs and symptoms of increased acid in the body like breathing fast; fast heartbeat; headache; confusion; unusually weak or tired; nausea, vomiting  suicidal thoughts, mood changes  trouble speaking or understanding  unusual sweating  unusually weak or tired Side effects that usually do not require medical attention (report to your doctor or health care professional if they continue or are bothersome):  dizziness  drowsiness  fever  loss of appetite  nausea, vomiting  pain, tingling, numbness in the hands or feet  stomach pain  tiredness  upset stomach This list may not describe all possible side effects. Call your doctor for medical advice about side effects. You may report side effects to FDA at 1-800-FDA-1088. Where should I keep my medicine? Keep out of the reach of children and pets. Store between 15 and 30 degrees C (59 and 86 degrees F). Protect from moisture. Keep the container tightly closed. Get rid of any unused medicine after the expiration date. To get rid of medicines that are no longer needed or have expired:  Take the medicine to a medicine take-back program. Check with your pharmacy or law enforcement to find a location.  If you cannot return the medicine, check the label or package insert to see if the medicine should be thrown out  in the garbage or flushed down the toilet. If you are not sure, ask your health care provider. If it is safe to put it in the trash, empty the medicine out of the container. Mix the medicine with cat litter, dirt, coffee grounds, or other unwanted substance. Seal the mixture in a bag or container. Put it in the trash. NOTE: This sheet is a summary. It may not cover all possible information. If you have questions about this medicine, talk to your doctor, pharmacist, or health care provider.  2021 Elsevier/Gold Standard (2020-04-23 15:41:57)  Ferri's  Clinical Advisor 2018, 1st Edition (1st ed., pp. 52.e5-690.e6). [place], [place]: Elsevier."> Rosalie Gums and [employer] (7th ed., pp. 224-128-8812.e5). [place], [place]: Elsevier, Inc."> https://www.ncbi.nlm.nih.gov/books/NBK507811/">  Idiopathic Intracranial Hypertension  Idiopathic intracranial hypertension (IIH) is a condition that increases pressure around the brain. The fluid that surrounds the brain and spinal cord (cerebrospinal fluid, or CSF) increases and causes the pressure. Idiopathic means that the cause of this condition is not known. IIH affects the brain and spinal cord (neurological disorder). If this condition is not treated, it can cause vision loss or blindness. What are the causes? The cause of this condition is not known. What increases the risk? The following factors may make you more likely to develop this condition:  Being very overweight (obese).  Being a female between the ages of 40 and 71 years old, who has not gone through menopause.  Taking certain medicines, such as birth control or steroids. What are the signs or symptoms? Symptoms of this condition include:  Headaches. This is the most common symptom.  Brief episodes of total blindness.  Double vision, blurred vision, or poor side (peripheral) vision.  Pain in the shoulders or neck.  Nausea and vomiting.  A sound like rushing water or a pulsing  sound within the ears (pulsatile tinnitus), or ringing in the ears. How is this diagnosed? This condition may be diagnosed based on:  Your symptoms and medical history.  Imaging tests of the brain, such as: ? CT scan. ? MRI. ? Magnetic resonance venogram (MRV) to check the veins.  Diagnostic lumbar puncture. This is a procedure to remove and examine a sample of cerebrospinal fluid. This procedure can determine whether too much fluid may be causing IIH.  A thorough eye exam to check for swelling or nerve damage in the eyes. How is this treated? Treatment for this condition depends on the symptoms. The goal of treatment is to decrease the pressure around your brain. Common treatments include:  Weight loss through healthy eating, salt restriction, and exercise, if you are overweight.  Medicines to decrease the production of spinal fluid and lower the pressure within your skull.  Medicines to prevent or treat headaches. Other treatments may include:  Surgery to place drains (shunts) in your brain for removing excess fluid.  Lumbar puncture to remove excess cerebrospinal fluid. Follow these instructions at home:  If you are overweight or obese, work with your health care provider to lose weight.  Take over-the-counter and prescription medicines only as told by your health care provider.  Ask your health care provider if the medicine prescribed to you requires you to avoid driving or using machinery.  Do not use any products that contain nicotine or tobacco, such as cigarettes, e-cigarettes, and chewing tobacco. If you need help quitting, ask your health care provider.  Keep all follow-up visits as told by your health care provider. This is important. Contact a health care provider if: You have changes in your vision, such as:  Double vision.  Blurred vision.  Poor peripheral vision. Get help right away if: You have any of the following symptoms and they get worse or do not  get better:  Headaches.  Nausea.  Vomiting.  Sudden trouble seeing. Summary  Idiopathic intracranial hypertension (IIH) is a condition that increases pressure around the brain. The cause is not known (is idiopathic).  The most common symptom of IIH is headaches. Vision changes, pain in the shoulders or neck, nausea, and vomiting may also occur.  Treatment for this condition  depends on your symptoms. The goal of treatment is to decrease the pressure around your brain.  If you are overweight or obese, work with your health care provider to lose weight.  Take over-the-counter and prescription medicines only as told by your health care provider. This information is not intended to replace advice given to you by your health care provider. Make sure you discuss any questions you have with your health care provider. Document Revised: 09/21/2019 Document Reviewed: 09/21/2019 Elsevier Patient Education  2021 [employer]

## 2020-12-21 ENCOUNTER — Other Ambulatory Visit: Payer: Self-pay

## 2020-12-21 ENCOUNTER — Ambulatory Visit (INDEPENDENT_AMBULATORY_CARE_PROVIDER_SITE_OTHER): Payer: Medicaid Other | Admitting: Podiatry

## 2020-12-21 ENCOUNTER — Ambulatory Visit (INDEPENDENT_AMBULATORY_CARE_PROVIDER_SITE_OTHER): Payer: Medicaid Other

## 2020-12-21 DIAGNOSIS — M722 Plantar fascial fibromatosis: Secondary | ICD-10-CM

## 2020-12-21 MED ORDER — MELOXICAM 15 MG PO TABS: 15.0000 mg | ORAL_TABLET | Freq: Every day | ORAL | 1 refills | Status: DC

## 2020-12-21 NOTE — Progress Notes (Signed)
   Subjective: 55 y.o. 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  Patient states that she sustained an injury in early September while visiting her native country in [place].  When the patient returned to the states she was treated by orthopedist with a steroidal injection and there was no improvement.  Patient is also tried OTC arch supports.  There is been no improvement and she is in significant amount of pain throughout the day to her right heel.  She presents for further treatment evaluation   Past Medical History:  Diagnosis Date  . Abnormal finding in CSF   . Arthritis   . Blindness - both eyes 12/1992   can see shadows w/ left eye  . GERD (gastroesophageal reflux disease)   . Helicobacter pylori gastritis 11/20/2014  . Hyperlipidemia   . Hypertension   . Hypothyroidism    pt denies  . Kidney stones   . Low blood pressure   . Middle ear infection   . Obesity   . Pseudomembranous colitis    hx of  . Pseudotumor cerebri    followed by Dr. Lucia Gaskins @ [facility]  . Right knee meniscal tear    w/ lateral plateau stress fx  . Syncope 10/31/2015     Objective: Physical Exam General: The patient is alert and oriented x3 in no acute distress.  Dermatology: Skin is warm, dry and supple bilateral lower extremities. Negative for open lesions or macerations bilateral.   Vascular: Dorsalis Pedis and Posterior Tibial pulses palpable bilateral.  Capillary fill time is immediate to all digits.  Neurological: Epicritic and protective threshold intact bilateral.   Musculoskeletal: Tenderness to palpation to the plantar aspect of the right heel along the plantar fascia. All other joints range of motion within normal limits bilateral. Strength 5/5 in all groups bilateral.   Radiographic exam: Normal osseous mineralization. Joint spaces preserved. No  fracture/dislocation/boney destruction. No other soft tissue abnormalities or radiopaque foreign bodies.   Assessment: 1. Plantar fasciitis right  Plan of Care:  1. Patient evaluated. Xrays reviewed.   2.  Prescription for meloxicam 15 mg daily 3.  The husband is requesting an MRI right heel.  The patient has dealt with the pain for approximately 6 months now with no improvement.  She has tried multiple conservative modalities. 4.  MRI ordered right foot without contrast 5.  Return to clinic after MRI to discuss findings  Felecia Shelling, DPM [facility]  Dr. Felecia Shelling, DPM    [address] [place], [place] [address]                Office [phone]  Fax [phone]

## 2020-12-27 ENCOUNTER — Other Ambulatory Visit: Payer: Self-pay

## 2020-12-27 ENCOUNTER — Ambulatory Visit
Admission: RE | Admit: 2020-12-27 | Discharge: 2020-12-27 | Disposition: A | Discharge status: Home / Self Care | Payer: Medicaid Other | Source: Ambulatory Visit | Attending: Podiatry | Admitting: Podiatry

## 2020-12-27 DIAGNOSIS — M722 Plantar fascial fibromatosis: Secondary | ICD-10-CM

## 2020-12-27 IMAGING — MR MR FOOT*R* W/O CM
4 of 5 series · 20 of 40 positions shown · non-contrast
Comparison: Radiographs [DATE]

CLINICAL DATA: Chronic foot pain after injury 6 months ago. No
relief from steroid injection two months ago. Plantar fasciitis
suspected.

EXAM:
MRI OF THE RIGHT FOREFOOT WITHOUT CONTRAST
TECHNIQUE: Multiplanar, multisequence MR imaging of the right forefoot was
performed. No intravenous contrast was administered.

[Series 4: T1 · coronal · 3.0mm · 0.29mm/px · 3 of 44 slices shown (1 of 2)]
[im 5/44]
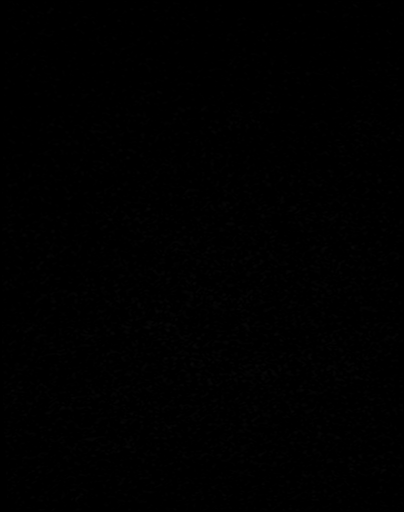
[im 24/44]
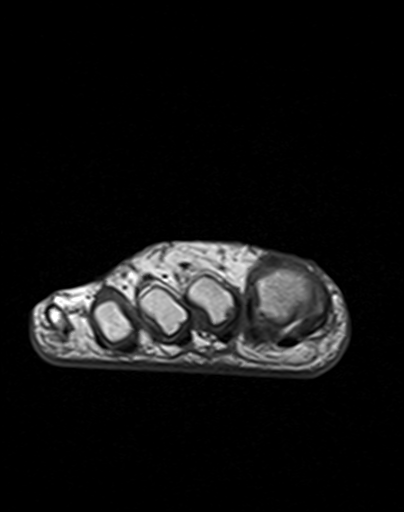
[im 39/44]
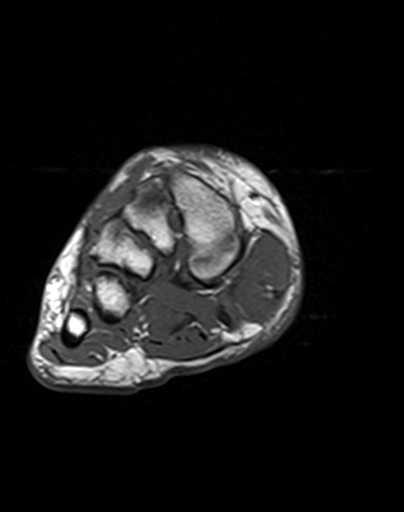

[Series 5: T2 fat-sat · coronal · 3.0mm · 0.23mm/px · 11 of 44 slices shown (1 of 2)]
[im 1/44]
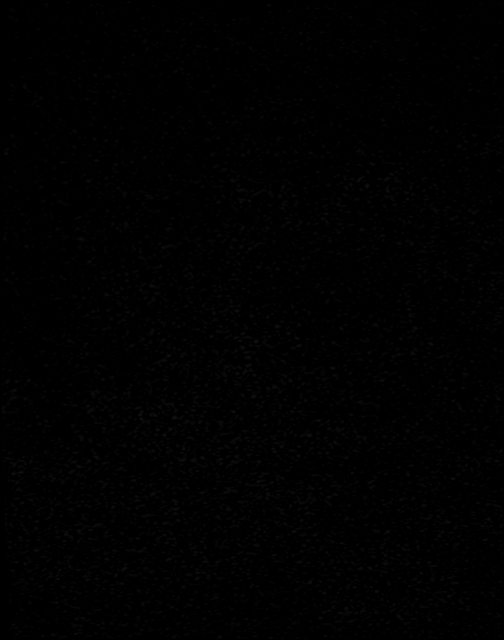
[im 5/44]
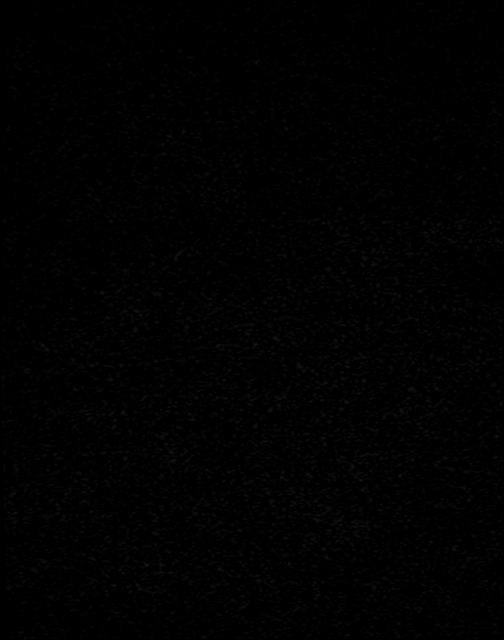
[im 9/44]
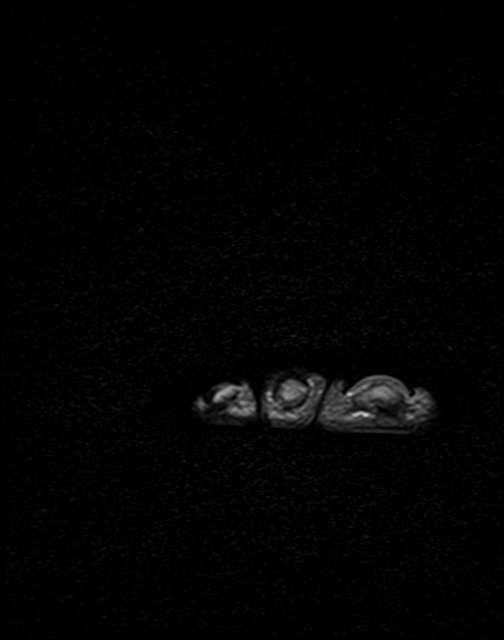
[im 13/44]
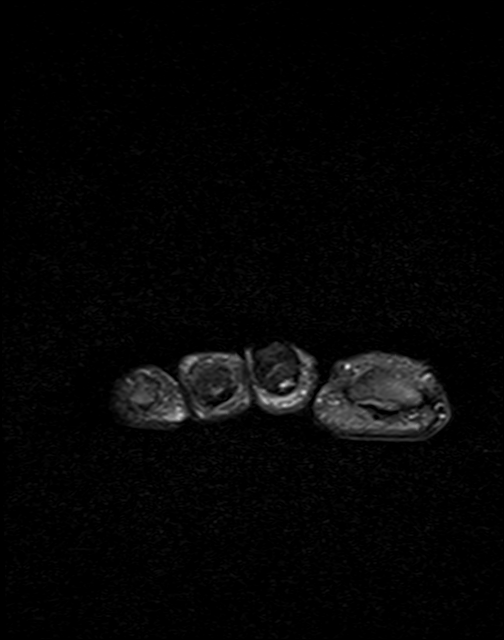
[im 18/44]
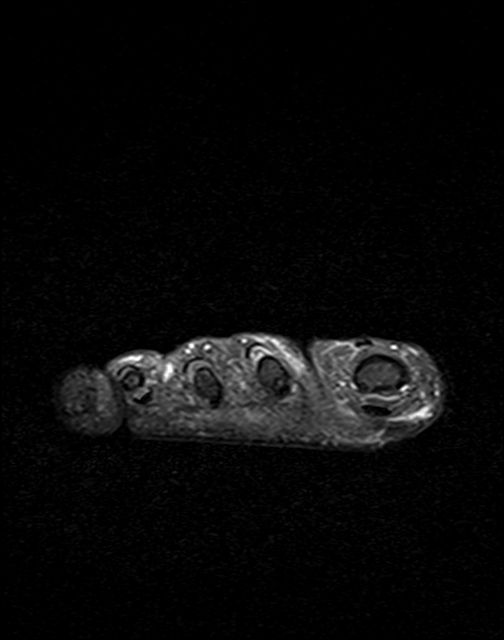
[im 22/44]
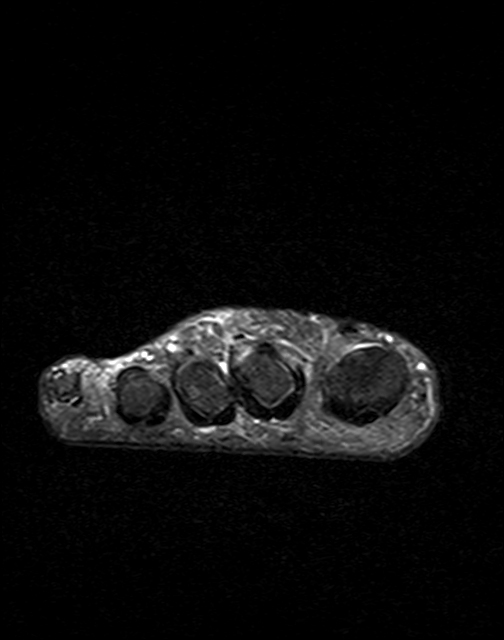
[im 26/44]
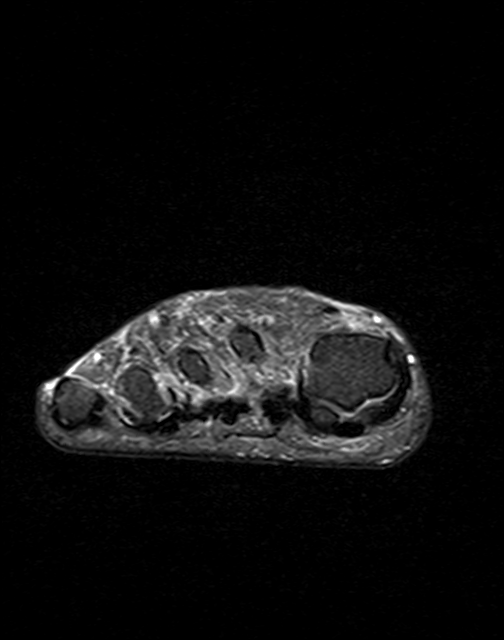
[im 31/44]
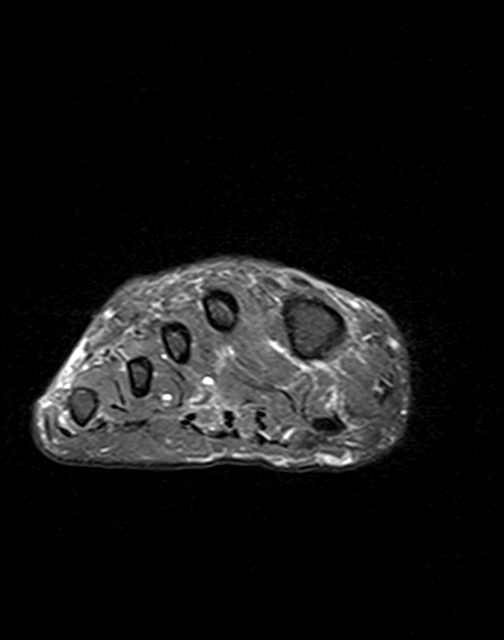
[im 35/44]
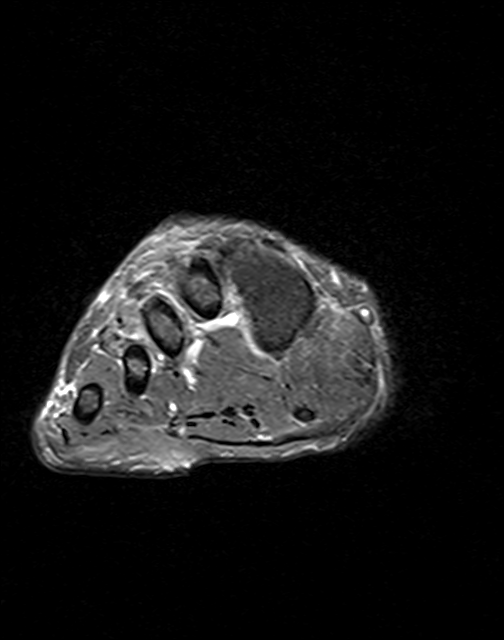
[im 39/44]
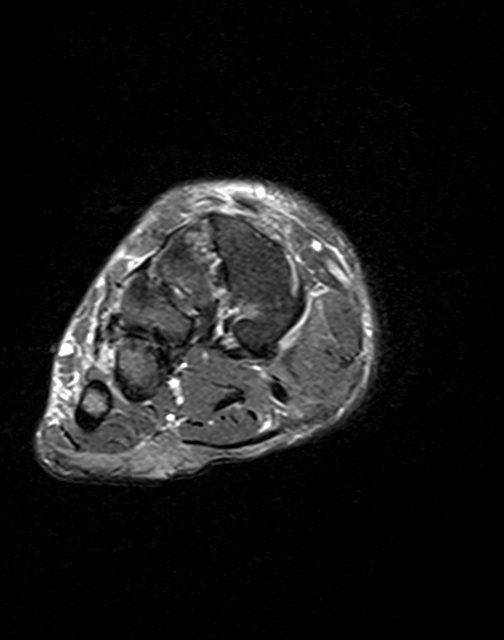
[im 44/44]
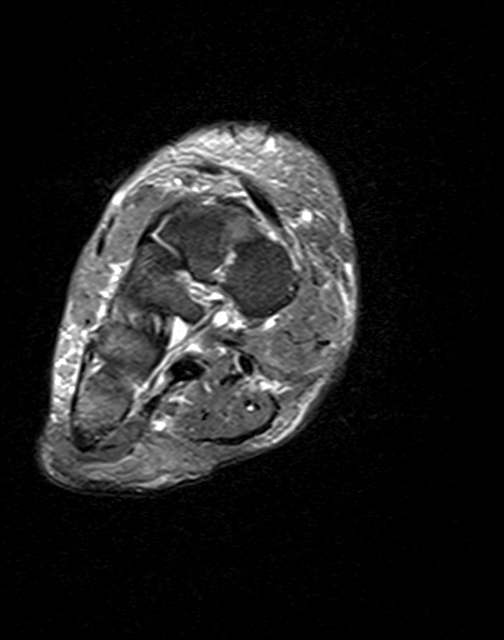

[Series 7: T1 · axial · 3.0mm · 0.35mm/px · z∈[-98,-17]mm · 3 of 22 slices shown (2 of 2)]
[im 1/22]
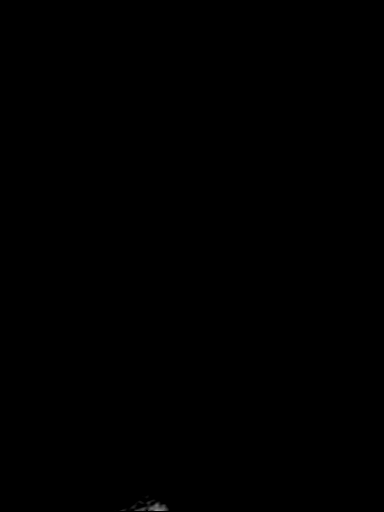
[im 11/22]
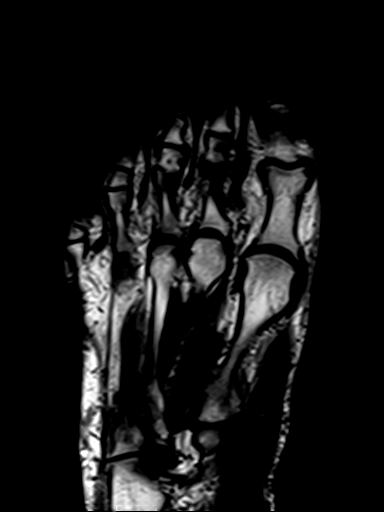
[im 22/22]
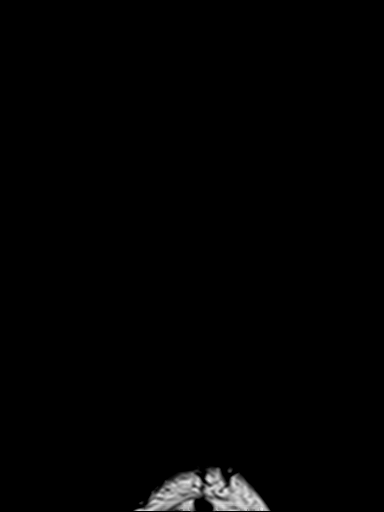

[Series 8: T2 fat-sat · axial · 3.0mm · 0.35mm/px · z∈[-86,-13]mm · 3 of 24 slices shown (2 of 2)]
[im 5/24]
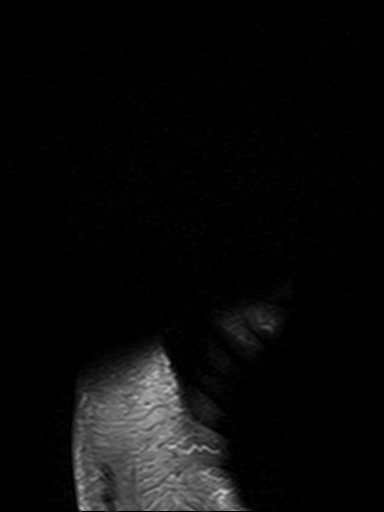
[im 14/24]
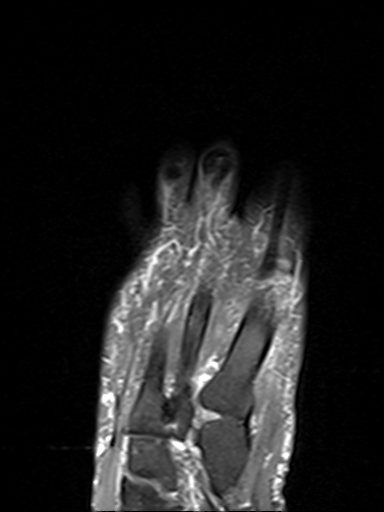
[im 24/24]
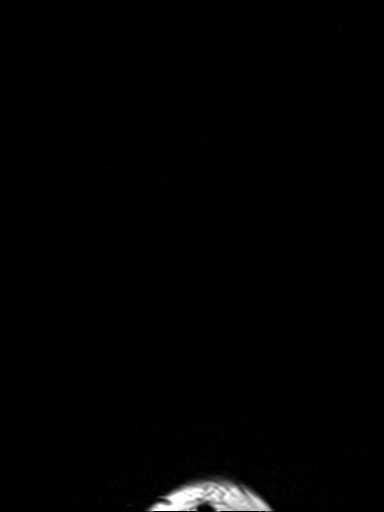

[20 of 40 positions shown; findings below may reference images not displayed]

FINDINGS: Bones/Joint/Cartilage

No evidence of acute fracture or dislocation. There are mild
degenerative changes at the Lisfranc joint and 1st
metatarsal-phalangeal joint. No significant joint effusions.

Ligaments

Intact Lisfranc ligament. The collateral ligaments of the
metatarsophalangeal joints are intact.

Muscles and Tendons

The forefoot muscles and tendons appear normal, without
tenosynovitis. The visualized portions of the plantar fascia appear
normal. The calcaneal attachment is not visualized on this
examination of the forefoot.

Soft tissues

Mild dorsal subcutaneous edema without focal fluid collection. No
evidence of foreign body.
IMPRESSION: 1. No acute findings or explanation for the patient's symptoms. The
calcaneal attachment of the plantar fascia is not imaged.
2. Mild degenerative changes at the Lisfranc joint and 1st
metatarsal-phalangeal joint.
3. Mild nonspecific dorsal subcutaneous edema.

## 2021-01-06 ENCOUNTER — Ambulatory Visit: Payer: Medicaid Other | Admitting: Podiatry

## 2021-01-13 ENCOUNTER — Ambulatory Visit (INDEPENDENT_AMBULATORY_CARE_PROVIDER_SITE_OTHER): Payer: Medicaid Other | Admitting: Podiatry

## 2021-01-13 ENCOUNTER — Other Ambulatory Visit: Payer: Self-pay

## 2021-01-13 DIAGNOSIS — M722 Plantar fascial fibromatosis: Secondary | ICD-10-CM

## 2021-01-13 MED ORDER — BETAMETHASONE SOD PHOS & ACET 6 (3-3) MG/ML IJ SUSP: 3.0000 mg | Freq: Once | INTRAMUSCULAR | Status: DC

## 2021-01-13 MED ORDER — MELOXICAM 15 MG PO TABS: 15.0000 mg | ORAL_TABLET | Freq: Every day | ORAL | 1 refills | Status: DC

## 2021-01-13 NOTE — Progress Notes (Signed)
   Subjective: 55 y.o. 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  Patient states that she sustained an injury in early September while visiting her native country in [place].    Patient presents today to review the MRI.  She states that she has been taking the meloxicam which does help alleviate some of her pain and symptoms.  No new complaints at this time  Past Medical History:  Diagnosis Date  . Abnormal finding in CSF   . Arthritis   . Blindness - both eyes 12/1992   can see shadows w/ left eye  . GERD (gastroesophageal reflux disease)   . Helicobacter pylori gastritis 11/20/2014  . Hyperlipidemia   . Hypertension   . Hypothyroidism    pt denies  . Kidney stones   . Low blood pressure   . Middle ear infection   . Obesity   . Pseudomembranous colitis    hx of  . Pseudotumor cerebri    followed by Dr. Lucia Gaskins @ [facility]  . Right knee meniscal tear    w/ lateral plateau stress fx  . Syncope 10/31/2015     Objective: Physical Exam General: The patient is alert and oriented x3 in no acute distress.  Dermatology: Skin is warm, dry and supple bilateral lower extremities. Negative for open lesions or macerations bilateral.   Vascular: Dorsalis Pedis and Posterior Tibial pulses palpable bilateral.  Capillary fill time is immediate to all digits.  Neurological: Epicritic and protective threshold intact bilateral.   Musculoskeletal: Tenderness to palpation to the plantar aspect of the right heel along the plantar fascia. All other joints range of motion within normal limits bilateral. Strength 5/5 in all groups bilateral.   MRI impression 12/27/2020 right foot: 1. No acute findings or explanation for the patient's symptoms. The calcaneal attachment of the plantar fascia is not imaged. 2. Mild degenerative changes at the Lisfranc joint and  1st metatarsal-phalangeal joint. 3. Mild nonspecific dorsal subcutaneous edema.  Assessment: 1. Plantar fasciitis right  Plan of Care:  1. Patient evaluated.  MRI reviewed.   2.  Continue meloxicam 15 mg daily  3.  Injection of 0.5 cc Celestone Soluspan injected into the plantar fascia right heel 4.  Continue OTC insoles with good supportive shoes 5.  Return to clinic in 4 weeks  Felecia Shelling, DPM [facility]  Dr. Felecia Shelling, DPM    [address] [place], [place] [address]                Office [phone]  Fax [phone]

## 2021-02-24 ENCOUNTER — Ambulatory Visit (INDEPENDENT_AMBULATORY_CARE_PROVIDER_SITE_OTHER): Payer: Medicaid Other | Admitting: Podiatry

## 2021-02-24 ENCOUNTER — Other Ambulatory Visit: Payer: Self-pay

## 2021-02-24 DIAGNOSIS — M722 Plantar fascial fibromatosis: Secondary | ICD-10-CM | POA: Diagnosis not present

## 2021-02-24 MED ORDER — MELOXICAM 15 MG PO TABS: 15.0000 mg | ORAL_TABLET | Freq: Every day | ORAL | 1 refills | Status: DC

## 2021-02-24 MED ORDER — BETAMETHASONE SOD PHOS & ACET 6 (3-3) MG/ML IJ SUSP
3.0000 mg | Freq: Once | INTRAMUSCULAR | Status: AC
  Administered 2021-02-24: 3 mg via INTRA_ARTICULAR

## 2021-02-24 NOTE — Progress Notes (Signed)
   Subjective: 55 y.o. 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  Patient states that she sustained an injury in early September while visiting her native country in [place].    Patient presents today to review the MRI.  Patient states that the injection helped significantly.  She continues to take the meloxicam daily. Past Medical History:  Diagnosis Date  . Abnormal finding in CSF   . Arthritis   . Blindness - both eyes 12/1992   can see shadows w/ left eye  . GERD (gastroesophageal reflux disease)   . Helicobacter pylori gastritis 11/20/2014  . Hyperlipidemia   . Hypertension   . Hypothyroidism    pt denies  . Kidney stones   . Low blood pressure   . Middle ear infection   . Obesity   . Pseudomembranous colitis    hx of  . Pseudotumor cerebri    followed by Dr. Lucia Gaskins @ [facility]  . Right knee meniscal tear    w/ lateral plateau stress fx  . Syncope 10/31/2015     Objective: Physical Exam General: The patient is alert and oriented x3 in no acute distress.  Dermatology: Skin is warm, dry and supple bilateral lower extremities. Negative for open lesions or macerations bilateral.   Vascular: Dorsalis Pedis and Posterior Tibial pulses palpable bilateral.  Capillary fill time is immediate to all digits.  Neurological: Epicritic and protective threshold intact bilateral.   Musculoskeletal: Tenderness to palpation to the plantar aspect of the right heel along the plantar fascia. All other joints range of motion within normal limits bilateral. Strength 5/5 in all groups bilateral.   MRI impression 12/27/2020 right foot: 1. No acute findings or explanation for the patient's symptoms. The calcaneal attachment of the plantar fascia is not imaged. 2. Mild degenerative changes at the Lisfranc joint and 1st metatarsal-phalangeal joint. 3. Mild nonspecific  dorsal subcutaneous edema.  Assessment: 1. Plantar fasciitis right  Plan of Care:  1. Patient evaluated.  2.  Continue meloxicam 15 mg daily.  Refill provided.  The patient is going to be out of the country for several months and they are requesting a 90-day supply. 3.  Injection of 0.5 cc Celestone Soluspan injected into the plantar fascia right heel 4.  Continue OTC insoles with good supportive shoes 5.  Return to clinic as needed  Felecia Shelling, DPM [facility]  Dr. Felecia Shelling, DPM    [address] [place], [place] [address]                Office [phone]  Fax [phone]

## 2021-03-18 NOTE — Progress Notes (Deleted)
No chief complaint on file.    HISTORY OF PRESENT ILLNESS: 03/18/21 ALL:  Janice Hanson is a 55 y.o. female here today for follow up for IIH. She was seen by Dr Lucia Gaskins 11/2020 and started on topiramate. Referral to PT placed for concerns of occipital pain.     HISTORY (copied from Dr Trevor Mace previous note)  HPI: This is a 55 year old patient with a past medical history of remote IIH with secondary optic nerve atrophy, we saw her back in 2017, at that time she was no longer on Diamox and multiple LPs had show normal opening pressures.  LPs had been performed over a year for surveillance.  In 2017 she denied any recent headaches or new vision changes.  Daughter provides most information and interpretation, she feels numbness in the back of the head, tingling and pressure in her ears, neck pain, muscle tightness, The headaches are similar to prior but the new symptoms stared in November after an ear infection, she saw ENT and they said no infection, she has a hearing test upcoming, she has numbness in her leg in the foot, they did an xray no tear or break, no back pain. She has pain in her foot where she hit it in the heel, she has been evaluated. Discussed PT. She felt better after the fluid was taken off in the ED, she does not have a lot of headaches since,no new vision changes.     MRI brain 11/21/2020: Brain: No acute infarction, hemorrhage, hydrocephalus, extra-axial collection or mass lesion. Minimal FLAIR hyperintensity in the cerebral white matter, mainly around the frontal horns of the lateral ventricles and in the frontal white matter, similar to 2017 and related to nonspecific remote insult. Partially empty sella which could be related to chart history of pseudotumor.  Vascular: Normal flow voids  Skull and upper cervical spine: Normal marrow signal  Sinuses/Orbits: Negative    REVIEW OF SYSTEMS: Out of a complete 14 system review of symptoms, the patient  complains only of the following symptoms, and all other reviewed systems are negative.    ALLERGIES: No Known Allergies   HOME MEDICATIONS: Outpatient Medications Prior to Visit  Medication Sig Dispense Refill  . Cyanocobalamin (VITAMIN B-12 PO) Take by mouth.    . meloxicam (MOBIC) 15 MG tablet Take 1 tablet (15 mg total) by mouth daily. 90 tablet 1  . omeprazole (PRILOSEC) 40 MG capsule Take 40 mg by mouth daily.    Marland Kitchen PRESCRIPTION MEDICATION Take 5 mg by mouth daily. Patient on BP medication. Daughter is pretty sure it's Amlodipine    . topiramate (TOPAMAX) 50 MG tablet Take 1 tablet (50 mg total) by mouth at bedtime. 30 tablet 6   Facility-Administered Medications Prior to Visit  Medication Dose Route Frequency Provider Last Rate Last Admin  . 0.9 %  sodium chloride infusion  500 mL Intravenous Continuous Iva Boop, MD      . betamethasone acetate-betamethasone sodium phosphate (CELESTONE) injection 3 mg  3 mg Intra-articular Once Felecia Shelling, DPM         PAST MEDICAL HISTORY: Past Medical History:  Diagnosis Date  . Abnormal finding in CSF   . Arthritis   . Blindness - both eyes 12/1992   can see shadows w/ left eye  . GERD (gastroesophageal reflux disease)   . Helicobacter pylori gastritis 11/20/2014  . Hyperlipidemia   . Hypertension   . Hypothyroidism    pt denies  . Kidney stones   .  Low blood pressure   . Middle ear infection   . Obesity   . Pseudomembranous colitis    hx of  . Pseudotumor cerebri    followed by Dr. Lucia Gaskins @ [facility]  . Right knee meniscal tear    w/ lateral plateau stress fx  . Syncope 10/31/2015     PAST SURGICAL HISTORY: Past Surgical History:  Procedure Laterality Date  . KNEE ARTHROSCOPY Right 10/10/2016   Procedure: ARTHROSCOPY KNEE WITH PARTIAL MEDIAL MENISECTOMY AND LATERAL SUBCHONDROPLASTY;  Surgeon: Yolonda Kida, MD;  Location: [facility];  Service: Orthopedics;  Laterality: Right;  .  TONSILLECTOMY       FAMILY HISTORY: Family History  Problem Relation Age of Onset  . Diabetes Brother   . Heart disease Paternal Grandfather   . Kidney Stones Maternal Uncle   . Diabetes Maternal Grandmother   . Heart disease Maternal Grandmother   . Diabetes Sister   . High blood pressure Other   . Colon cancer Neg Hx   . Pseudotumor cerebri Neg Hx      SOCIAL HISTORY: Social History   Socioeconomic History  . Marital status: Married    Spouse name: Not on file  . Number of children: 4  . Years of education: Not on file  . Highest education level: Not on file  Occupational History  . Occupation: na  Tobacco Use  . Smoking status: Never Smoker  . Smokeless tobacco: Never Used  Vaping Use  . Vaping Use: Never used  Substance and Sexual Activity  . Alcohol use: No    Alcohol/week: 0.0 standard drinks  . Drug use: No  . Sexual activity: Not on file  Other Topics Concern  . Not on file  Social History Narrative   Married originally from [place]   4 children 2 girls/2 boys   Caffeine use: 1-2 cups tea daily   Lives with husband and daughter   Social Determinants of Health   Financial Resource Strain: Not on file  Food Insecurity: Not on file  Transportation Needs: Not on file  Physical Activity: Not on file  Stress: Not on file  Social Connections: Not on file  Intimate Partner Violence: Not on file      PHYSICAL EXAM  There were no vitals filed for this visit. There is no height or weight on file to calculate BMI.   Generalized: Well developed, in no acute distress  Cardiology: normal rate and rhythm, no murmur auscultated  Respiratory: clear to auscultation bilaterally    Neurological examination  Mentation: Alert oriented to time, place, history taking. Follows all commands speech and language fluent Cranial nerve II-XII: Pupils were equal round reactive to light. Extraocular movements were full, visual field were full on confrontational test.  Facial sensation and strength were normal. Uvula tongue midline. Head turning and shoulder shrug  were normal and symmetric. Motor: The motor testing reveals 5 over 5 strength of all 4 extremities. Good symmetric motor tone is noted throughout.  Sensory: Sensory testing is intact to soft touch on all 4 extremities. No evidence of extinction is noted.  Coordination: Cerebellar testing reveals good finger-nose-finger and heel-to-shin bilaterally.  Gait and station: Gait is normal. Tandem gait is normal. Romberg is negative. No drift is seen.  Reflexes: Deep tendon reflexes are symmetric and normal bilaterally.     DIAGNOSTIC DATA (LABS, IMAGING, TESTING) - I reviewed patient records, labs, notes, testing and imaging myself where available.  Lab Results  Component Value Date  WBC 5.6 11/21/2020   HGB 13.9 11/21/2020   HCT 41.0 11/21/2020   MCV 85.4 11/21/2020   PLT 301 11/21/2020      Component Value Date/Time   NA 138 11/21/2020 0031   NA 140 10/07/2015 0955   K 3.1 (L) 11/21/2020 0031   CL 105 11/21/2020 0031   CO2 26 11/21/2020 0031   GLUCOSE 134 (H) 11/21/2020 0031   BUN 8 11/21/2020 0031   BUN 11 10/07/2015 0955   CREATININE 0.73 11/21/2020 0031   CALCIUM 8.8 (L) 11/21/2020 0031   PROT 7.8 07/05/2019 2047   PROT 7.2 10/07/2015 0955   ALBUMIN 3.4 (L) 07/05/2019 2047   ALBUMIN 3.6 10/07/2015 0955   AST 21 07/05/2019 2047   ALT 19 07/05/2019 2047   ALKPHOS 92 07/05/2019 2047   BILITOT 0.3 07/05/2019 2047   BILITOT 0.2 10/07/2015 0955   GFRNONAA >60 11/21/2020 0031   GFRAA >60 07/05/2019 2047   No results found for: CHOL, HDL, LDLCALC, LDLDIRECT, TRIG, CHOLHDL Lab Results  Component Value Date   HGBA1C 6.3 (H) 10/07/2015   Lab Results  Component Value Date   VITAMINB12 218 11/04/2015   Lab Results  Component Value Date   TSH 1.670 10/07/2015    No flowsheet data found.   No flowsheet data found.   ASSESSMENT AND PLAN  55 y.o. year old female  has a  past medical history of Abnormal finding in CSF, Arthritis, Blindness - both eyes (12/1992), GERD (gastroesophageal reflux disease), Helicobacter pylori gastritis (11/20/2014), Hyperlipidemia, Hypertension, Hypothyroidism, Kidney stones, Low blood pressure, Middle ear infection, Obesity, Pseudomembranous colitis, Pseudotumor cerebri, Right knee meniscal tear, and Syncope (10/31/2015). here with     No diagnosis found.    No orders of the defined types were placed in this encounter.    No orders of the defined types were placed in this encounter.     I spent 20 minutes of face-to-face and non-face-to-face time with patient.  This included previsit chart review, lab review, study review, order entry, electronic health record documentation, patient education.    Shawnie Dapper, MSN, FNP-C 03/18/2021, 9:19 AM  [facility] [address] [place], [place] [address] [phone]

## 2021-03-23 ENCOUNTER — Ambulatory Visit: Payer: Medicaid Other | Admitting: Family Medicine

## 2021-10-06 ENCOUNTER — Other Ambulatory Visit: Payer: Self-pay | Admitting: Internal Medicine

## 2021-10-06 DIAGNOSIS — G932 Benign intracranial hypertension: Secondary | ICD-10-CM

## 2021-10-12 ENCOUNTER — Inpatient Hospital Stay: Admission: RE | Admit: 2021-10-12 | Payer: Medicaid Other | Source: Ambulatory Visit

## 2021-10-13 ENCOUNTER — Ambulatory Visit
Admission: RE | Admit: 2021-10-13 | Discharge: 2021-10-13 | Disposition: A | Discharge status: Home / Self Care | Payer: Medicaid Other | Source: Ambulatory Visit | Attending: Internal Medicine | Admitting: Internal Medicine

## 2021-10-13 DIAGNOSIS — G932 Benign intracranial hypertension: Secondary | ICD-10-CM

## 2021-10-13 IMAGING — XA DG SPINAL PUNCT LUMBAR DIAG WITH FL CT GUIDANCE
1 series · 1 of 1 positions shown · non-contrast
Comparison: MRI head [DATE]

CLINICAL DATA: 55-year-old female with longstanding history of
pseudotumor cerebrum. She undergoes lumbar puncture with
decompression elevated CSF pressure once per year for the past 20
years.

EXAM:
DIAGNOSTIC LUMBAR PUNCTURE UNDER FLUOROSCOPIC GUIDANCE

[Series 1: ortho standard · 1 of 1 slices shown]
[im 1/1]
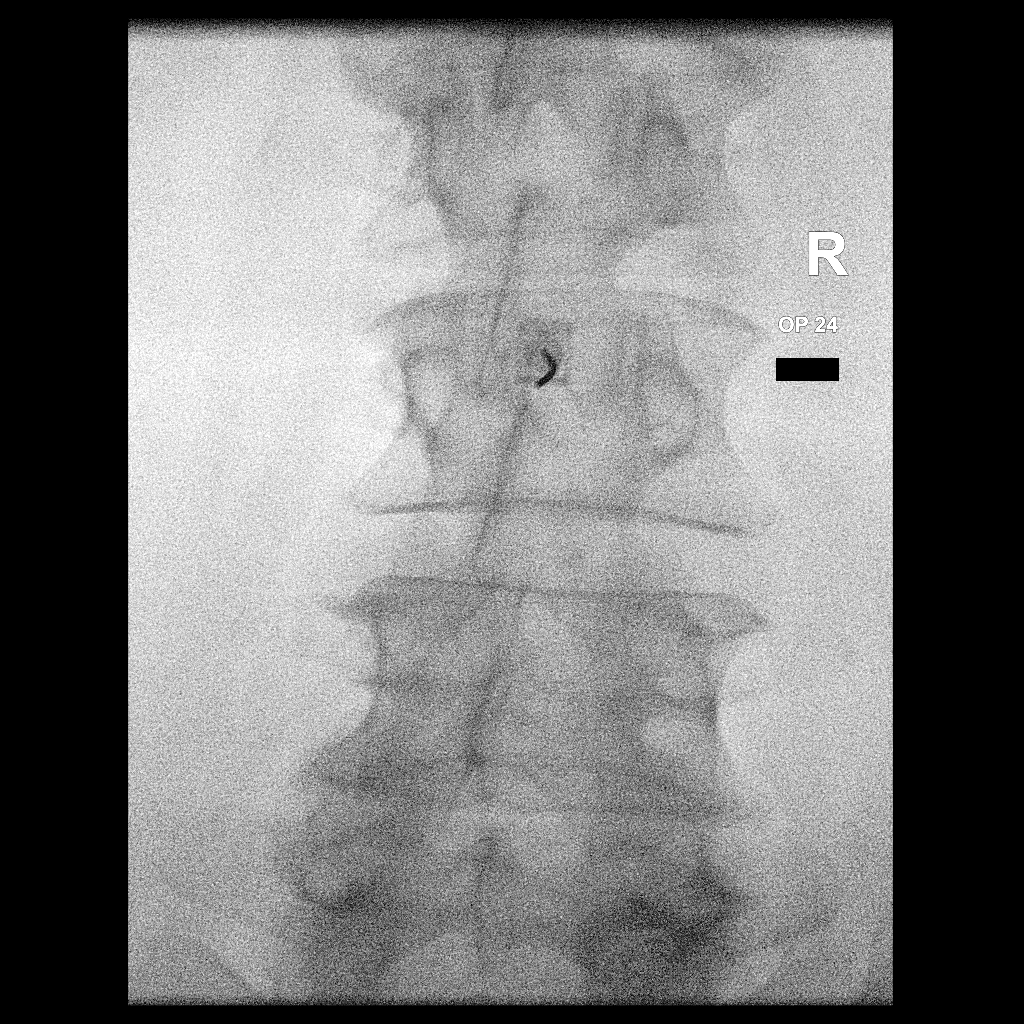

[1 of 1 positions shown; findings below may reference images not displayed]

FLUOROSCOPY TIME:  Fluoroscopy Time:  0 minutes 15 seconds

Radiation Exposure Index (if provided by the fluoroscopic device):
1.2 mGy

Number of Acquired Spot Images: 0

PROCEDURE:
Informed consent was obtained from the patient prior to the
procedure, including potential complications of headache, allergy,
and pain. With the patient prone, the lower back was prepped with
Betadine. 1% Lidocaine was used for local anesthesia. Lumbar
puncture was performed at the L2-L3 level using a 20 gauge needle
with return of clear CSF with an opening pressure of 24 cm water.
Twenty ml of CSF were obtained for laboratory studies. Closing
pressure was normalized at 15 cm H2O. The patient tolerated the
procedure well and there were no apparent complications.
IMPRESSION: 1. Elevated opening pressure at 24 cm H2O.
2. Normalization of closing pressure at 15 cm H2O after removal of
20 mL clear CSF.

## 2021-10-13 NOTE — Discharge Instructions (Signed)

## 2021-11-12 ENCOUNTER — Emergency Department (HOSPITAL_COMMUNITY)
Admission: EM | Admit: 2021-11-12 | Discharge: 2021-11-13 | Disposition: A | Discharge status: Home / Self Care | Payer: Medicaid Other | Attending: Emergency Medicine | Admitting: Emergency Medicine

## 2021-11-12 ENCOUNTER — Encounter (HOSPITAL_COMMUNITY): Payer: Self-pay

## 2021-11-12 ENCOUNTER — Other Ambulatory Visit: Payer: Self-pay

## 2021-11-12 DIAGNOSIS — R55 Syncope and collapse: Secondary | ICD-10-CM | POA: Diagnosis present

## 2021-11-12 DIAGNOSIS — G43001 Migraine without aura, not intractable, with status migrainosus: Secondary | ICD-10-CM

## 2021-11-12 DIAGNOSIS — G932 Benign intracranial hypertension: Secondary | ICD-10-CM | POA: Diagnosis not present

## 2021-11-12 DIAGNOSIS — G43901 Migraine, unspecified, not intractable, with status migrainosus: Secondary | ICD-10-CM | POA: Diagnosis not present

## 2021-11-12 DIAGNOSIS — Z79899 Other long term (current) drug therapy: Secondary | ICD-10-CM | POA: Diagnosis not present

## 2021-11-12 NOTE — ED Triage Notes (Addendum)
Declined interpreter service.   Pt reports dizziness and several near syncopal episodes this week. Unsteady gait. Hx of IIH. Pt says sx have began after getting a lumbar puncture in the past. Last known well 1 week ago.

## 2021-11-13 ENCOUNTER — Emergency Department (HOSPITAL_COMMUNITY): Payer: Medicaid Other

## 2021-11-13 ENCOUNTER — Encounter (HOSPITAL_COMMUNITY): Payer: Self-pay

## 2021-11-13 LAB — CBC WITH DIFFERENTIAL/PLATELET
Abs Immature Granulocytes: 0.01 10*3/uL (ref 0.00–0.07)
Basophils Absolute: 0.1 10*3/uL (ref 0.0–0.1)
Basophils Relative: 1 %
Eosinophils Absolute: 0.3 10*3/uL (ref 0.0–0.5)
Eosinophils Relative: 5 %
HCT: 43.1 % (ref 36.0–46.0)
Hemoglobin: 14.4 g/dL (ref 12.0–15.0)
Immature Granulocytes: 0 %
Lymphocytes Relative: 39 %
Lymphs Abs: 2 10*3/uL (ref 0.7–4.0)
MCH: 28 pg (ref 26.0–34.0)
MCHC: 33.4 g/dL (ref 30.0–36.0)
MCV: 83.7 fL (ref 80.0–100.0)
Monocytes Absolute: 0.5 10*3/uL (ref 0.1–1.0)
Monocytes Relative: 10 %
Neutro Abs: 2.4 10*3/uL (ref 1.7–7.7)
Neutrophils Relative %: 45 %
Platelets: 270 10*3/uL (ref 150–400)
RBC: 5.15 MIL/uL — ABNORMAL HIGH (ref 3.87–5.11)
RDW: 13.7 % (ref 11.5–15.5)
WBC: 5.2 10*3/uL (ref 4.0–10.5)
nRBC: 0 % (ref 0.0–0.2)

## 2021-11-13 LAB — COMPREHENSIVE METABOLIC PANEL
ALT: 16 U/L (ref 0–44)
AST: 18 U/L (ref 15–41)
Albumin: 3.4 g/dL — ABNORMAL LOW (ref 3.5–5.0)
Alkaline Phosphatase: 82 U/L (ref 38–126)
Anion gap: 3 — ABNORMAL LOW (ref 5–15)
BUN: 7 mg/dL (ref 6–20)
CO2: 30 mmol/L (ref 22–32)
Calcium: 8.8 mg/dL — ABNORMAL LOW (ref 8.9–10.3)
Chloride: 106 mmol/L (ref 98–111)
Creatinine, Ser: 0.62 mg/dL (ref 0.44–1.00)
GFR, Estimated: >60 mL/min (ref >60)
Glucose, Bld: 128 mg/dL — ABNORMAL HIGH (ref 70–99)
Potassium: 3.5 mmol/L (ref 3.5–5.1)
Sodium: 139 mmol/L (ref 135–145)
Total Bilirubin: 0.6 mg/dL (ref 0.3–1.2)
Total Protein: 7.7 g/dL (ref 6.5–8.1)

## 2021-11-13 LAB — PROTIME-INR
INR: 1 (ref 0.8–1.2)
Prothrombin Time: 13 seconds (ref 11.4–15.2)

## 2021-11-13 MED ORDER — IOHEXOL 350 MG/ML SOLN
75.0000 mL | Freq: Once | INTRAVENOUS | Status: AC | PRN
Start: 2021-11-13 — End: 2021-11-13
  Administered 2021-11-13: 75 mL via INTRAVENOUS

## 2021-11-13 MED ORDER — METOCLOPRAMIDE HCL 5 MG/ML IJ SOLN
10.0000 mg | Freq: Once | INTRAMUSCULAR | Status: AC
  Administered 2021-11-13: 10 mg via INTRAVENOUS
  Filled 2021-11-13: qty 2

## 2021-11-13 NOTE — ED Provider Notes (Signed)
[facility]-EMERGENCY DEPT Provider Note   CSN: [id number] Arrival date & time: 11/12/21  2309     History  Chief Complaint  Patient presents with   Near Syncope   Gait Problem    Janice Hanson is a 56 y.o. female.  HPI     56 year old female comes in with chief complaint of dizziness, gait disturbance, numbness, headache.  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  She gets annual lumbar puncture, the last one being about 2 weeks ago.  Reportedly, her opening pressure was in the low 20s.  Patient indicates that for the last 2 weeks, after the LP was completed she started having constant tinnitus with numbness over her head.  Those symptoms are constant.  She started having intermittent episodes of dizziness, usually provoked when she is sitting for a long time or toilet.  Over the last couple of days she has been having some headaches.  Headaches are generalized, moderate to severe in intensity.  She typically does not get headaches because of her IH anymore.  Patient also reports bilateral upper extremity and lower extremity tingling sensation.  No new medications.  She denies any photophobia, there is no positional component to her headache.  Home Medications Prior to Admission medications   Medication Sig Start Date End Date Taking? Authorizing Provider  amLODipine (NORVASC) 10 MG tablet Take 10 mg by mouth daily. 10/04/21  Yes [provider]  hydrochlorothiazide (HYDRODIURIL) 25 MG tablet Take 25 mg by mouth daily. 10/04/21  Yes [provider]  omeprazole (PRILOSEC) 40 MG capsule Take 40 mg by mouth daily.   Yes [provider]  vitamin B-12 (CYANOCOBALAMIN) 1000 MCG tablet Take 1,000 mcg by mouth daily.   Yes [provider]  gabapentin (NEURONTIN) 100 MG capsule Take 100 mg by mouth 3 (three) times daily. Patient not taking: Reported on  11/13/2021 11/10/21   [provider]      Allergies    Patient has no known allergies.    Review of Systems   Review of Systems  Constitutional:  Positive for activity change.  Neurological:  Positive for headaches.   Physical Exam Updated Vital Signs BP (!) 124/57    Pulse (!) 58    Temp 97.7 F (36.5 C) (Oral)    Resp 16    Ht 5\' 7"  (1.702 m)    Wt 117.9 kg    SpO2 94%    BMI 40.72 kg/m  Physical Exam Vitals and nursing note reviewed.  Constitutional:      Appearance: She is well-developed.  HENT:     Head: Atraumatic.  Cardiovascular:     Rate and Rhythm: Normal rate.  Pulmonary:     Effort: Pulmonary effort is normal.  Musculoskeletal:     Cervical back: Normal range of motion and neck supple.  Skin:    General: Skin is warm and dry.  Neurological:     Mental Status: She is alert and oriented to person, place, and time. Mental status is at baseline.     Sensory: No sensory deficit.     Motor: No weakness.     Coordination: Coordination normal.     Gait: Gait normal.    ED Results / Procedures / Treatments   Labs (all labs ordered are listed, but only abnormal results are displayed) Labs Reviewed  COMPREHENSIVE METABOLIC PANEL - Abnormal; Notable for the following components:  Result Value   Glucose, Bld 128 (*)    Calcium 8.8 (*)    Albumin 3.4 (*)    Anion gap 3 (*)    All other components within normal limits  CBC WITH DIFFERENTIAL/PLATELET - Abnormal; Notable for the following components:   RBC 5.15 (*)    All other components within normal limits  PROTIME-INR    EKG None  Radiology CT VENOGRAM HEAD  Result Date: 11/13/2021 CLINICAL DATA:  Initial evaluation for acute headache. EXAM: CT VENOGRAM HEAD TECHNIQUE: Venographic phase images of the brain were obtained following the administration of intravenous contrast. Multiplanar reformats and maximum intensity projections were generated. RADIATION DOSE REDUCTION: This exam was performed  according to the departmental dose-optimization program which includes automated exposure control, adjustment of the mA and/or kV according to patient size and/or use of iterative reconstruction technique. CONTRAST:  25mL OMNIPAQUE IOHEXOL 350 MG/ML SOLN COMPARISON:  Prior study from 11/21/2020 FINDINGS: Brain: Cerebral volume within normal limits for patient age. No evidence for acute intracranial hemorrhage. No findings to suggest acute large vessel territory infarct. No mass lesion, midline shift, or mass effect. Ventricles are normal in size without evidence for hydrocephalus. No extra-axial fluid collection identified. Empty sella noted. Vascular: No hyperdense vessel seen prior to contrast administration. Following contrast administration, normal enhancement seen throughout the superior sagittal sinus to the torcula. Transverse and sigmoid sinuses are patent as are the visualized proximal internal jugular veins. Straight sinus, vein of Galen, internal cerebral veins, basal veins of Rosenthal appear patent. No appreciable cortical vein thrombosis. No abnormality seen about the cavernous sinus. Superior orbital veins symmetric and within normal limits. Skull: Scalp soft tissues demonstrate no acute abnormality. Calvarium intact. Sinuses/Orbits: Globes and orbital soft tissues within normal limits. Visualized paranasal sinuses are clear. No mastoid effusion. IMPRESSION: 1. Negative CT venogram. No evidence for dural venous sinus thrombosis or other acute intracranial abnormality. 2. Empty sella. Finding consistent with history of idiopathic intracranial hypertension. Electronically Signed   By: Rise Mu M.D.   On: 11/13/2021 04:53    Procedures Procedures    Medications Ordered in ED Medications  metoCLOPramide (REGLAN) injection 10 mg (10 mg Intravenous Given 11/13/21 0100)  iohexol (OMNIPAQUE) 350 MG/ML injection 75 mL (75 mLs Intravenous Contrast Given 11/13/21 0309)    ED Course/ Medical  Decision Making/ A&P Clinical Course as of 11/13/21 0657  Sat Nov 13, 2021  0616 Upon reassessment, patient reports that the headache has resolved. She continued to have no neurologic complains. Strict return precautions discussed, pt will return to the ER if there is visual complains, seizures, altered mental status, loss of consciousness, dizziness, new focal weakness, or numbness.   Patient ambulated by the nursing staff, she was steady with her gait.  The patient appears reasonably screened and/or stabilized for discharge and I doubt any other medical condition or other [facility] requiring further screening, evaluation, or treatment in the ED at this time prior to discharge.   Results from the ER workup discussed with the patient face to face and all questions answered to the best of my ability. The patient is safe for discharge with strict return precautions.   [AN]    Clinical Course User Index [AN] Derwood Kaplan, MD                           Medical Decision Making Number and complexity of problems evaluated during this encounter: - Moderate: =1 chronic illnesses with exacerbation,  progression, or side effects of  - Moderate: 1 undiagnosed new problem with uncertain prognosis  Amount and/or Complexity of Data: - Unique tests ordered including labs and imaging: > 3 - Prior notes reviewed: Outpatient neurology - Additional history provided by independent historian: Patient's daughter, who was at the bedside. - Consulted or discussed management/test interpretation w/ external professional: Neurologist.  We discussed the patient's medical history, presenting symptoms, CT venogram results.  They recommended that if patient's headache has resolved, then she can proceed with outpatient follow-up.  Otherwise she will need MRI brain to ensure there is no posterior fossa syndrome.  Risk of Morbidity, Mortality, or Complications - Risk level: High: Higher level of testing such as MRI and  hospitalization considered, however, patient's symptoms resolved with headache cocktail, and she has close follow-up with GI already established with likely outpatient MRI set up.  She has good social support system, therefore we were comfortable discharging her with strict ER return precautions.  Problems Addressed: IIH (idiopathic intracranial hypertension): chronic illness or injury Migraine without aura and with status migrainosus, not intractable: acute illness or injury with systemic symptoms  Amount and/or Complexity of Data Reviewed Independent Historian: caregiver External Data Reviewed: notes. Labs: ordered. Radiology: ordered.  Risk Prescription drug management.   56 year old female comes in with multiple complaints. She indicates that she has history of IIH and migraines.  I reviewed patient's outpatient neurology notes as well.  It appears that she had lumbar puncture done about 2 weeks ago.  Subsequently she started having symptoms that started with numbness around her head that is constant, intermittent dizziness with intermittent ataxic gait, more recently she is having headache.  Given her history of IIH, differential diagnosis includes venous dural thrombosis.  CT venogram ordered.  Given her LP, post LP syndrome considered in the differential diagnosis.  However, the headache is not typical of post LP syndrome.  The other constellation of symptoms she is having are also not consistent with post LP syndrome.  Complex migraine also in the differential diagnosis, especially given that she has history of it.  Patient given IV Reglan along with IV fluids.  Her CT venogram results were negative.  Blood work is reassuring.  Discussed case with Dr. Onalee HuaMcNeil Kirkpatrick, neurology.  He indicated that if patient's symptoms have resolved, then she can carry forward with the outpatient neurology follow-up.  If she is still having headaches or is ataxic, order MRI brain without  contrast.  Patient reassessed.  She is still with her family.  She indicates that her headache had resolved.  Her numbness and tingling had also resolved.  We ambulated her, the nursing staff informed me that patient was steady and not dizzy.  We informed them the plan for close follow-up and getting the outpatient MRI completed.  Family is in agreement with the plan.   Final Clinical Impression(s) / ED Diagnoses Final diagnoses:  Migraine without aura and with status migrainosus, not intractable  IIH (idiopathic intracranial hypertension)    Rx / DC Orders ED Discharge Orders     None         Derwood KaplanNanavati, Desiray Orchard, MD 11/13/21 0710

## 2021-11-13 NOTE — ED Notes (Signed)
Pt ambulated in the hall with no c/o dizziness, but does endorse a mild headache.

## 2021-11-13 NOTE — Discharge Instructions (Signed)
You are seen in the ER for headache and associated numbness, dizziness.  Fortunately, it appears that your symptoms resolved with treatment in the ED.  Our suspicion right now is that you most likely have migrainous headaches.  CT scan is reassuring.  No evidence of clots.  Given that you have a close follow-up with neurology coming up, we recommend that you continue to take Tylenol 500 mg every 4-6 hours for now.  Please return to the ER if the headache gets severe and in not improving, you have associated new one sided numbness, tingling, weakness or confusion, seizures, poor balance or poor vision.

## 2021-11-13 NOTE — ED Notes (Signed)
Pt discharged. Instructions given. AAOX4. Pt in no apparent distress with mild pain. The opportunity to ask questions was provided.  

## 2021-11-30 ENCOUNTER — Emergency Department (HOSPITAL_COMMUNITY)
Admission: EM | Admit: 2021-11-30 | Discharge: 2021-12-01 | Disposition: A | Discharge status: Home / Self Care | Payer: Medicaid Other | Attending: Emergency Medicine | Admitting: Emergency Medicine

## 2021-11-30 ENCOUNTER — Emergency Department (HOSPITAL_COMMUNITY): Payer: Medicaid Other

## 2021-11-30 DIAGNOSIS — R42 Dizziness and giddiness: Secondary | ICD-10-CM | POA: Insufficient documentation

## 2021-11-30 DIAGNOSIS — R110 Nausea: Secondary | ICD-10-CM | POA: Insufficient documentation

## 2021-11-30 DIAGNOSIS — I10 Essential (primary) hypertension: Secondary | ICD-10-CM | POA: Diagnosis not present

## 2021-11-30 DIAGNOSIS — Z79899 Other long term (current) drug therapy: Secondary | ICD-10-CM | POA: Insufficient documentation

## 2021-11-30 DIAGNOSIS — R251 Tremor, unspecified: Secondary | ICD-10-CM | POA: Diagnosis not present

## 2021-11-30 LAB — CBC WITH DIFFERENTIAL/PLATELET
Abs Immature Granulocytes: 0.04 10*3/uL (ref 0.00–0.07)
Basophils Absolute: 0 10*3/uL (ref 0.0–0.1)
Basophils Relative: 1 %
Eosinophils Absolute: 0.1 10*3/uL (ref 0.0–0.5)
Eosinophils Relative: 2 %
HCT: 44.1 % (ref 36.0–46.0)
Hemoglobin: 14.8 g/dL (ref 12.0–15.0)
Immature Granulocytes: 1 %
Lymphocytes Relative: 28 %
Lymphs Abs: 1.8 10*3/uL (ref 0.7–4.0)
MCH: 28.2 pg (ref 26.0–34.0)
MCHC: 33.6 g/dL (ref 30.0–36.0)
MCV: 84.2 fL (ref 80.0–100.0)
Monocytes Absolute: 0.6 10*3/uL (ref 0.1–1.0)
Monocytes Relative: 10 %
Neutro Abs: 3.6 10*3/uL (ref 1.7–7.7)
Neutrophils Relative %: 58 %
Platelets: 296 10*3/uL (ref 150–400)
RBC: 5.24 MIL/uL — ABNORMAL HIGH (ref 3.87–5.11)
RDW: 13.2 % (ref 11.5–15.5)
WBC: 6.2 10*3/uL (ref 4.0–10.5)
nRBC: 0 % (ref 0.0–0.2)

## 2021-11-30 LAB — COMPREHENSIVE METABOLIC PANEL
ALT: 17 U/L (ref 0–44)
AST: 20 U/L (ref 15–41)
Albumin: 3.2 g/dL — ABNORMAL LOW (ref 3.5–5.0)
Alkaline Phosphatase: 78 U/L (ref 38–126)
Anion gap: 8 (ref 5–15)
BUN: 10 mg/dL (ref 6–20)
CO2: 26 mmol/L (ref 22–32)
Calcium: 8.9 mg/dL (ref 8.9–10.3)
Chloride: 106 mmol/L (ref 98–111)
Creatinine, Ser: 0.84 mg/dL (ref 0.44–1.00)
GFR, Estimated: >60 mL/min (ref >60)
Glucose, Bld: 125 mg/dL — ABNORMAL HIGH (ref 70–99)
Potassium: 3.8 mmol/L (ref 3.5–5.1)
Sodium: 140 mmol/L (ref 135–145)
Total Bilirubin: 0.3 mg/dL (ref 0.3–1.2)
Total Protein: 7.2 g/dL (ref 6.5–8.1)

## 2021-11-30 IMAGING — CT CT HEAD W/O CM
3 series · 15 of 47 positions shown, 18 images · non-contrast
Comparison: None.

CLINICAL DATA: Tremor, nausea, dizziness



[Series 5: head 5.0 h30s · axial · 0.44mm/px · z∈[-67,+63]mm · 9 of 32 slices shown, 12 images]
[im 3/32  brain]
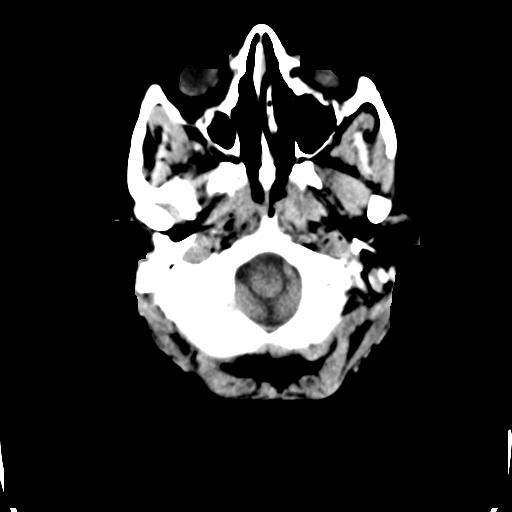
[im 3/32  bone]
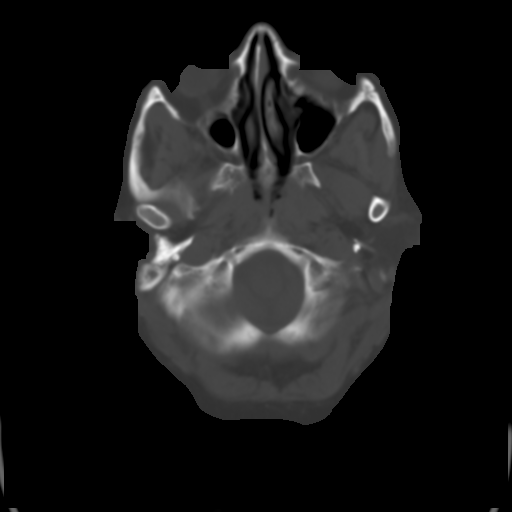
[im 6/32  brain]
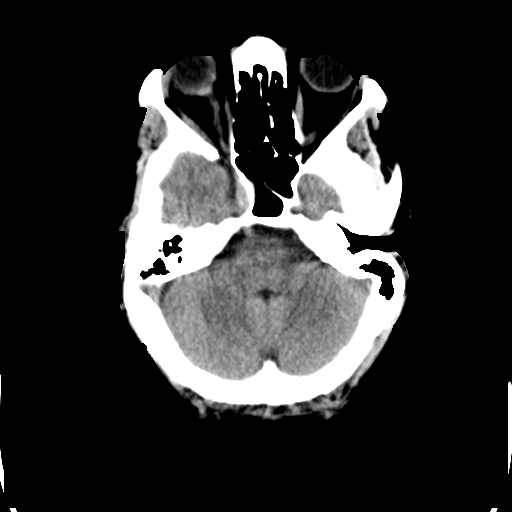
[im 9/32  brain]
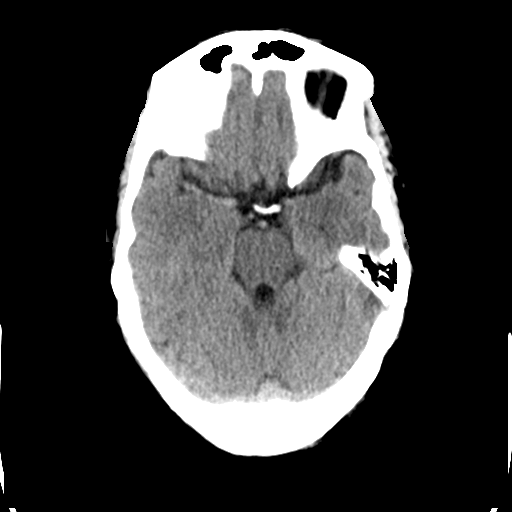
[im 12/32  brain]
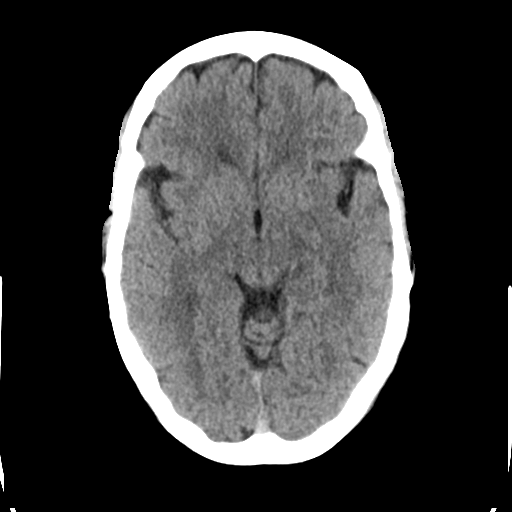
[im 17/32  brain]
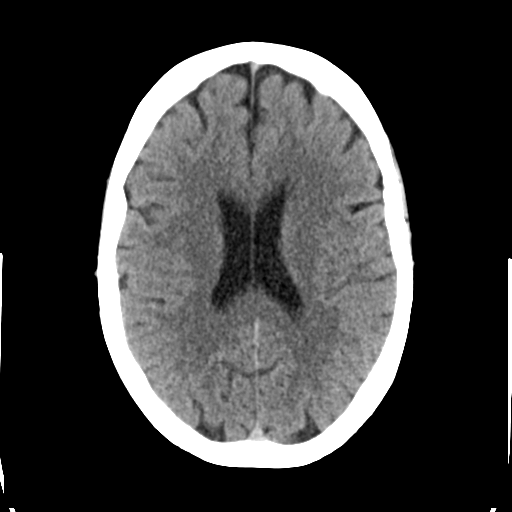
[im 17/32  bone]
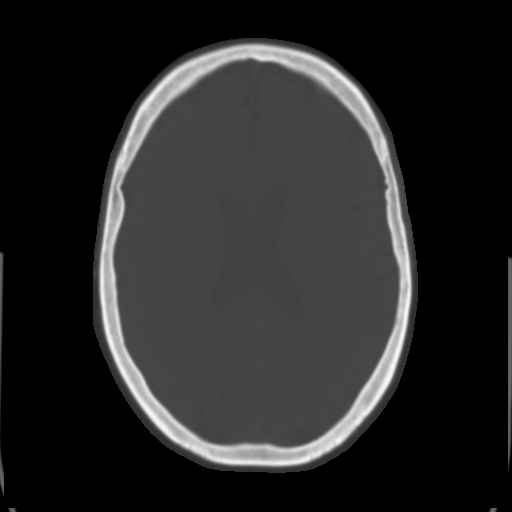
[im 20/32  brain]
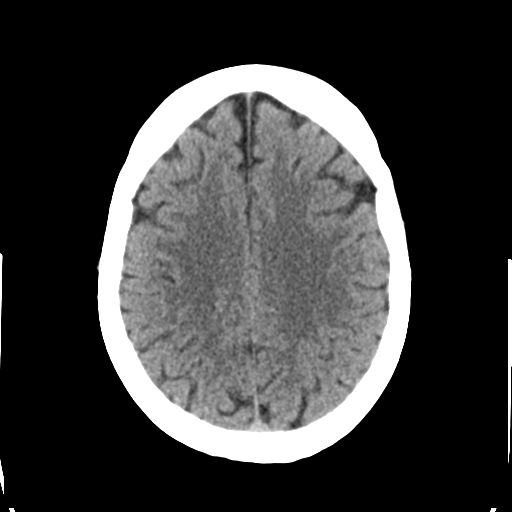
[im 23/32  brain]
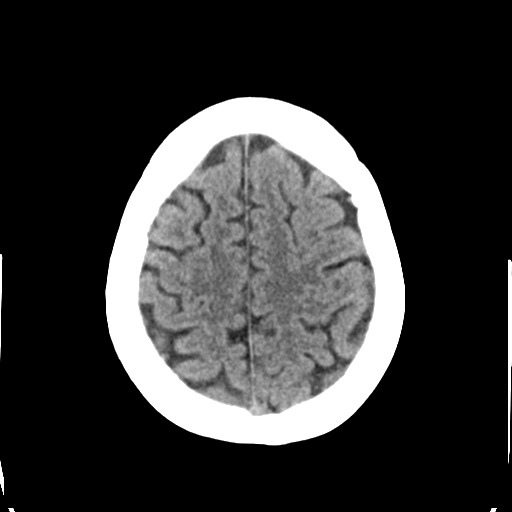
[im 26/32  brain]
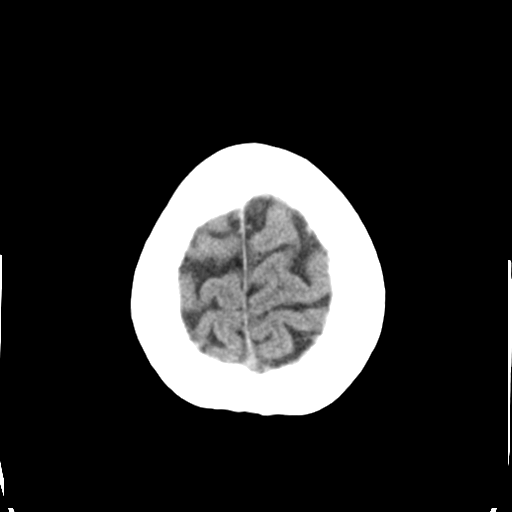
[im 29/32  brain]
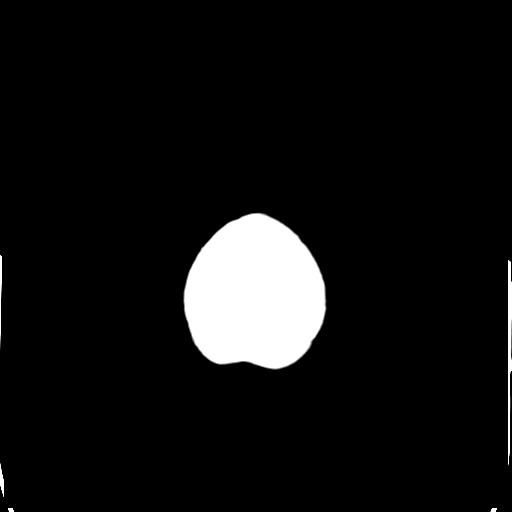
[im 29/32  bone]
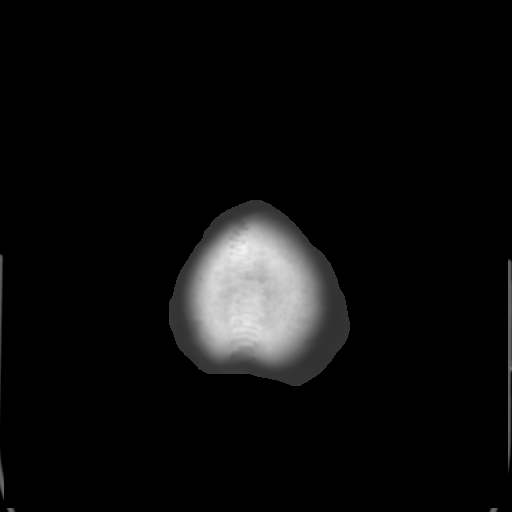

[Series 7: head 3.0 mpr cor · coronal · 0.30mm/px · 3 of 74 slices shown]
[im 25/74  brain]
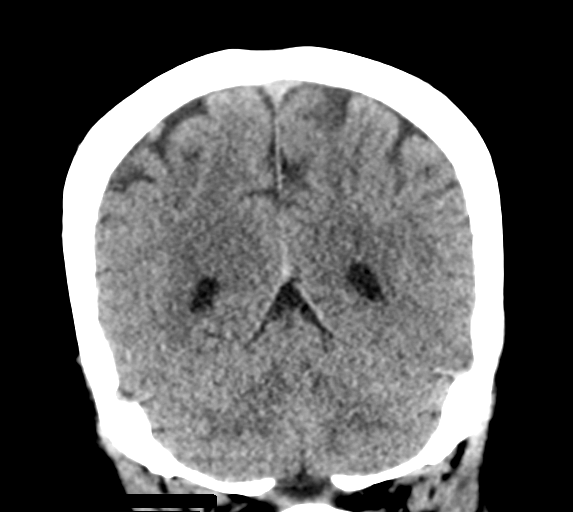
[im 33/74  brain]
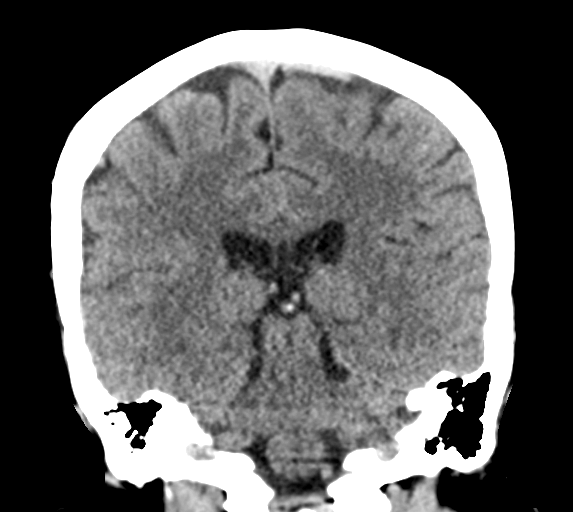
[im 41/74  brain]
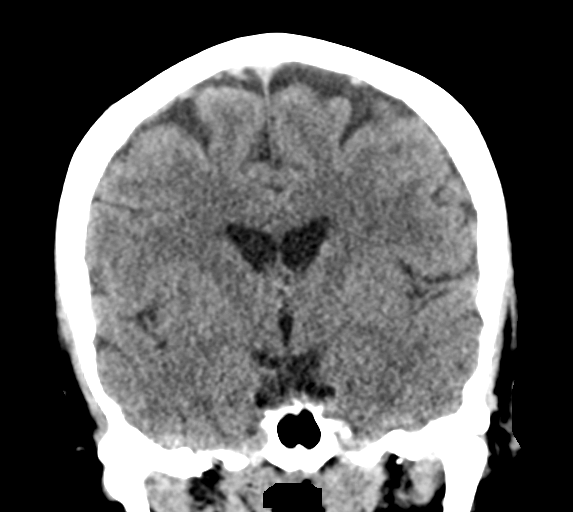

[Series 8: head 3.0 mpr sag · sagittal · 0.31mm/px · 3 of 57 slices shown]
[im 19/57  brain]
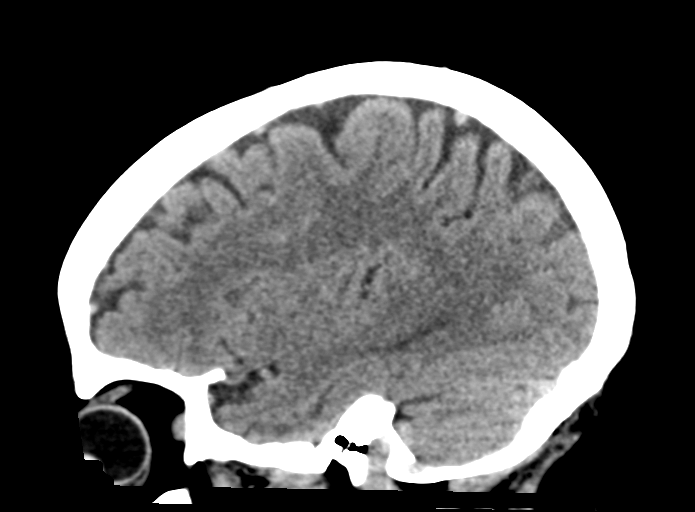
[im 29/57  brain]
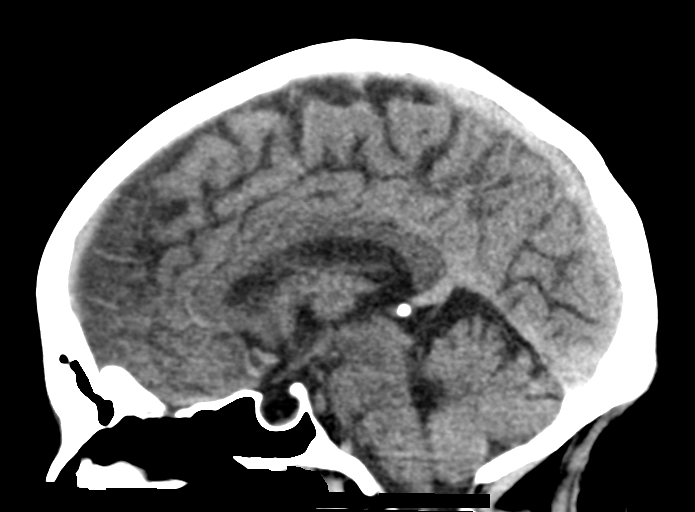
[im 38/57  brain]
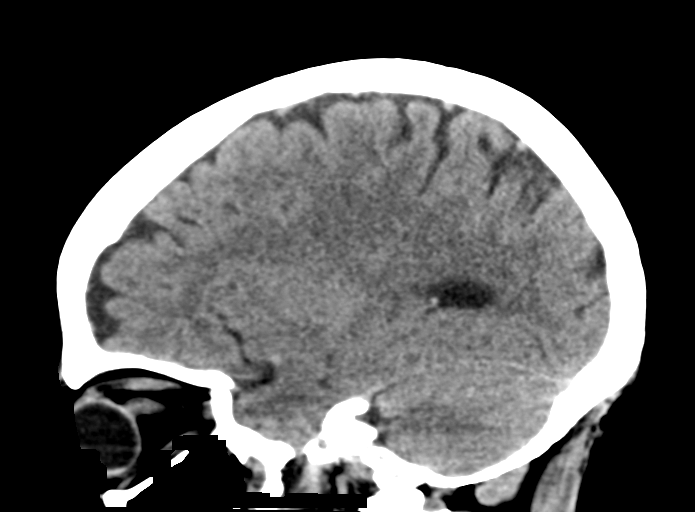

[15 of 47 positions shown; findings below may reference images not displayed]

FINDINGS: Brain: Normal anatomic configuration. No abnormal intra or
extra-axial mass lesion or fluid collection. No abnormal mass effect
or midline shift. No evidence of acute intracranial hemorrhage or
infarct. Ventricular size is normal. Cerebellum unremarkable.

Vascular: Unremarkable

Skull: Intact

Sinuses/Orbits: Paranasal sinuses are clear. Orbits are
unremarkable.

Other: Mastoid air cells and middle ear cavities are clear.
IMPRESSION: Normal examination.  No acute abnormality.

## 2021-11-30 NOTE — ED Provider Triage Note (Signed)
Emergency Medicine Provider Triage Evaluation Note  Janice Hanson , a 56 y.o. female  was evaluated in triage.  Pt complains of headache, dizziness, tremors, numbness and weakness.  Reports the symptoms have been ongoing since she had a LP performed in December.  Normally the symptoms last for approximately 10 minutes and then resolve however today symptoms have been more severe and have lasted a longer duration.  Symptoms started approximately 545pm.  Tremor is located throughout her entire body but most visible in her head.  Review of Systems  Positive: Headache, dizziness, tremors, numbness, weakness, Negative: Bowel/bladder dysfunction  Physical Exam  BP 135/81 (BP Location: Right Arm)    Pulse 71    Temp 98.2 F (36.8 C) (Oral)    Resp 19    SpO2 98%  Gen:   Awake, no distress   Resp:  Normal effort  MSK:   Moves extremities without difficulty  Other:  No facial asymmetry or dysarthria.  Equal grip strength.  Patient able to lift both lower extremities and hold against gravity without difficulty.  +5 strength to bilateral upper extremities.  Sensation to light touch grossly intact to bilateral upper and lower extremities  Medical Decision Making  Medically screening exam initiated at 8:06 PM.  Appropriate orders placed.  Janice Hanson was informed that the remainder of the evaluation will be completed by another provider, this initial triage assessment does not replace that evaluation, and the importance of remaining in the ED until their evaluation is complete.     Haskel Schroeder, [place] 11/30/21 2008

## 2021-11-30 NOTE — ED Triage Notes (Signed)
Pt via [facility], w hx idiopathic intracranial hypertension, spinal taps yearly, last month was last tap. Over last month, tremors, numbness & tingling, nausea, dizziness. Normally episodes lasting approx 65min, today episodes for 2hrs. Bilateral blindness. Neurologist Ahern  130/74 HR 77 98% RA

## 2021-12-01 ENCOUNTER — Other Ambulatory Visit: Payer: Self-pay

## 2021-12-01 ENCOUNTER — Emergency Department (HOSPITAL_COMMUNITY): Payer: Medicaid Other

## 2021-12-01 LAB — MAGNESIUM: Magnesium: 2 mg/dL (ref 1.7–2.4)

## 2021-12-01 IMAGING — MR MR HEAD W/O CM
6 of 10 series · 27 of 48 positions shown · non-contrast
Comparison: Head CT [DATE] and MRI [DATE]

CLINICAL DATA: Tremors, headache, dizziness, numbness, and
weakness. History of idiopathic intracranial hypertension.

EXAM:
MRI HEAD WITHOUT CONTRAST
TECHNIQUE: Multiplanar, multiecho pulse sequences of the brain and surrounding
structures were obtained without intravenous contrast.

[Series 2: DWI · axial · 3.0mm · 0.94mm/px · z∈[-102,+43]mm · 8 of 100 slices shown (1 of 2)]
[im 1/100]
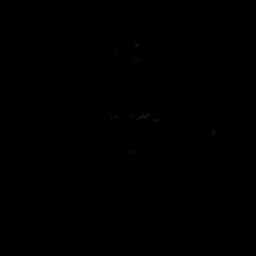
[im 12/100]
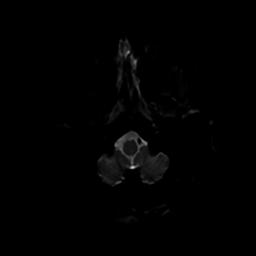
[im 34/100]
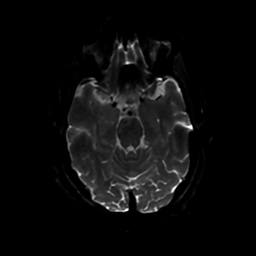
[im 45/100]
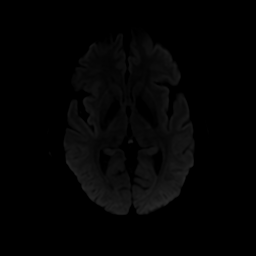
[im 56/100]
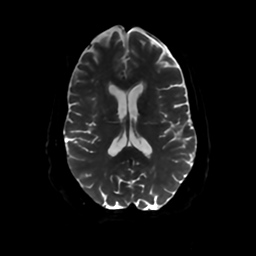
[im 67/100]
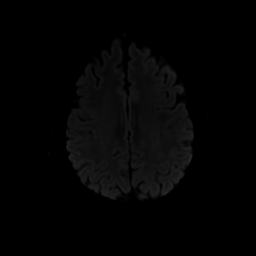
[im 89/100]
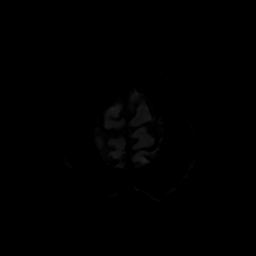
[im 100/100]
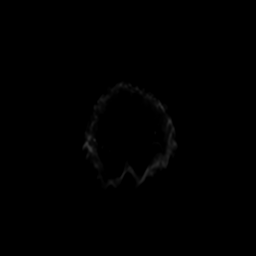

[Series 3: DWI · coronal · 4.0mm · 0.94mm/px · 7 of 76 slices shown (2 of 2)]
[im 1/76]
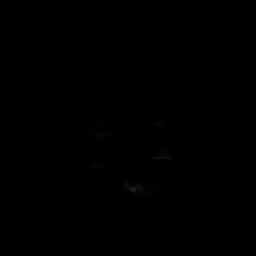
[im 13/76]
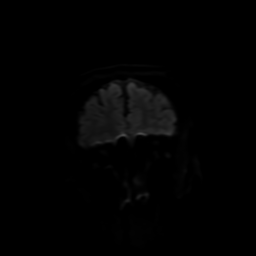
[im 26/76]
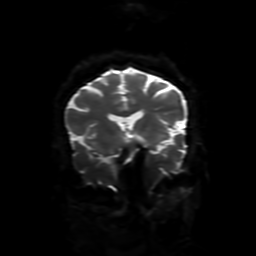
[im 38/76]
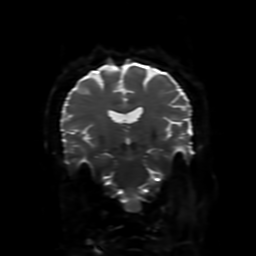
[im 51/76]
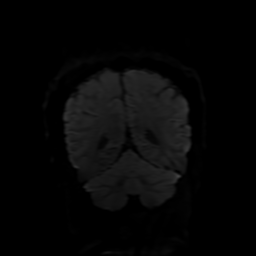
[im 63/76]
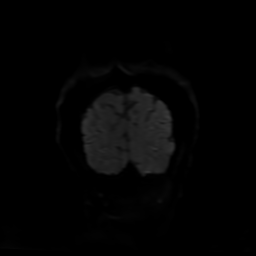
[im 76/76]
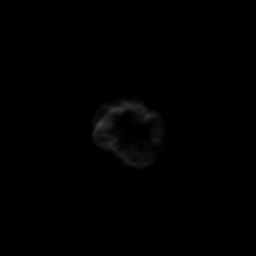

[Series 4: FLAIR · sagittal · 5.0mm · 0.23mm/px · 2 of 24 slices shown (1 of 2)]
[im 1/24]
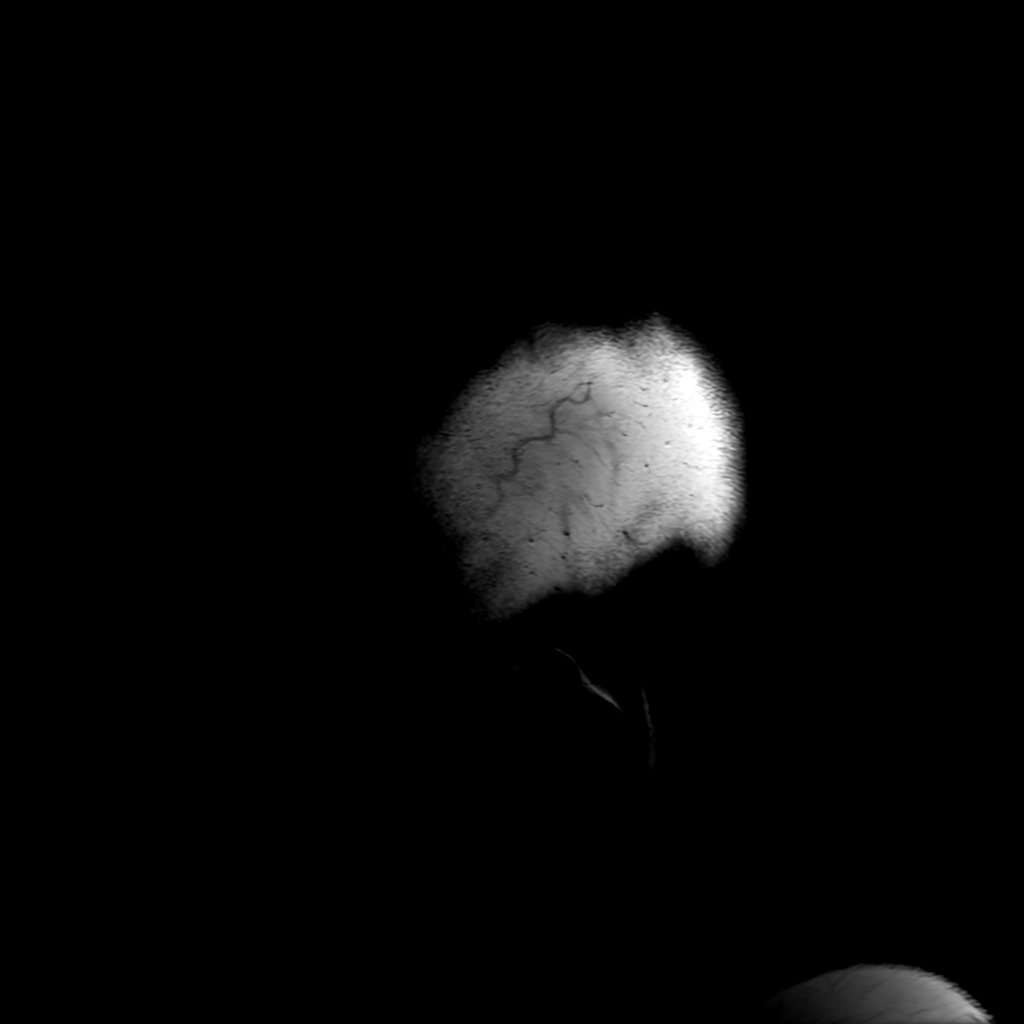
[im 24/24]
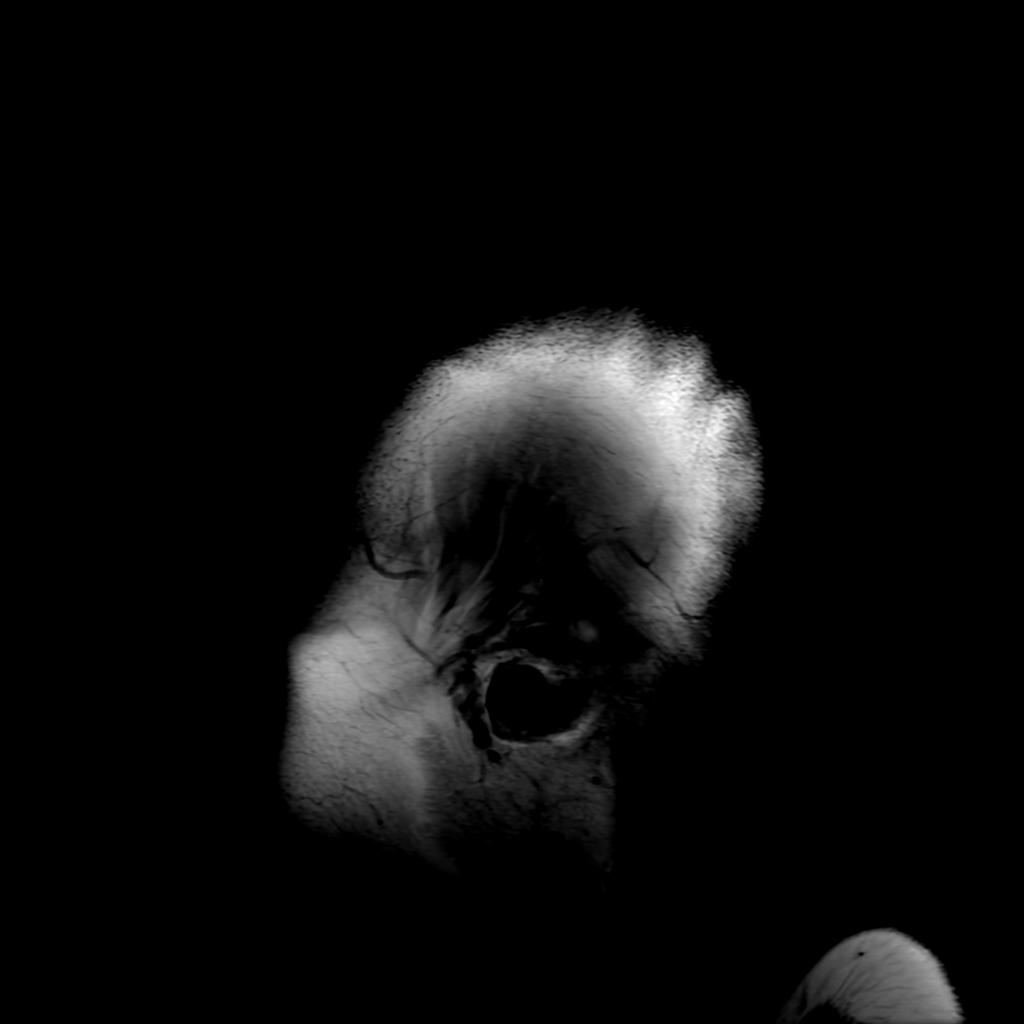

[Series 6: FLAIR · axial · 4.0mm · 0.45mm/px · z∈[-109,+37]mm · 3 of 35 slices shown (2 of 2)]
[im 1/35]
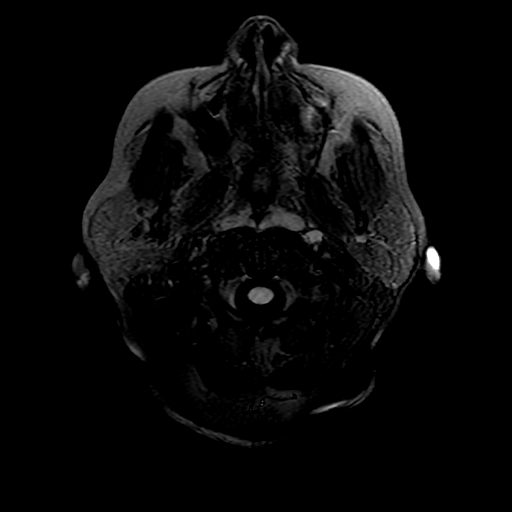
[im 18/35]
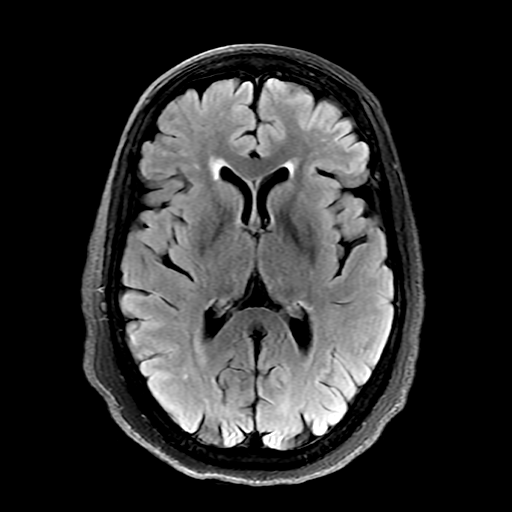
[im 35/35]
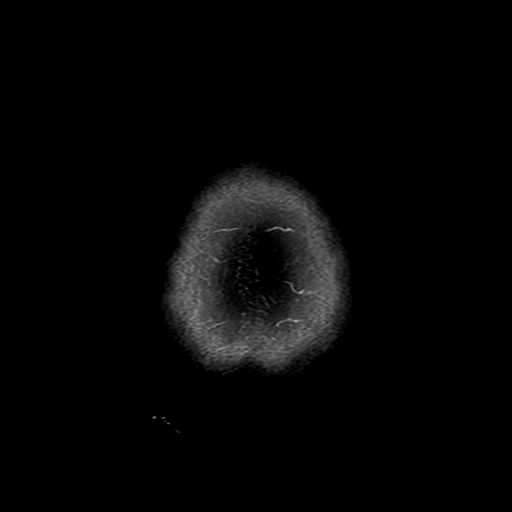

[Series 250: ADC · axial · 3.0mm · 0.94mm/px · z∈[-102,+43]mm · 4 of 50 slices shown (1 of 2)]
[im 1/50]
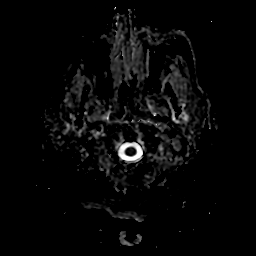
[im 17/50]
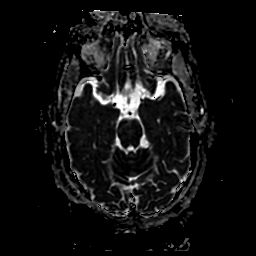
[im 33/50]
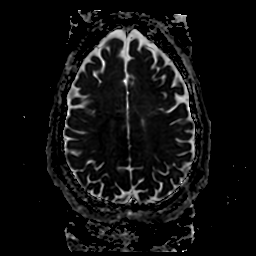
[im 50/50]
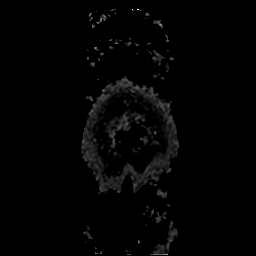

[Series 350: ADC · coronal · 4.0mm · 0.94mm/px · 3 of 39 slices shown (2 of 2)]
[im 1/39]
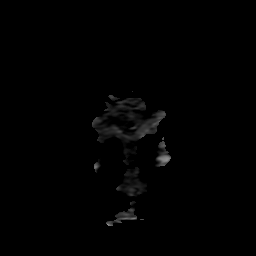
[im 20/39]
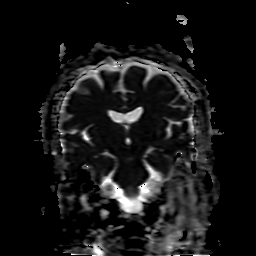
[im 39/39]
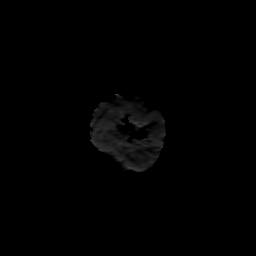

[27 of 48 positions shown; findings below may reference images not displayed]

FINDINGS: Brain: There is no evidence of an acute infarct, intracranial
hemorrhage, mass, midline shift, or extra-axial fluid collection. An
enlarged, partially empty sella is unchanged from the prior MRI.
Small T2 hyperintensities in the subcortical and deep cerebral white
matter bilaterally, greatest in the frontal lobes, are unchanged
from the prior MRI and are mildly advanced for age. The ventricles
and sulci are normal. The cerebellar tonsils are normally
positioned.

Vascular: Major intracranial vascular flow voids are preserved.

Skull and upper cervical spine: Unremarkable bone marrow signal.

Sinuses/Orbits: Unremarkable orbits. Paranasal sinuses and mastoid
air cells are clear.

Other: None.
IMPRESSION: 1. No acute intracranial abnormality.
2. Unchanged mild cerebral white matter T2 hyperintensities,
nonspecific though may reflect chronic small vessel ischemia,
migraines, or prior infection/inflammation.
3. Partially empty sella which can be seen with idiopathic
intracranial hypertension.

## 2021-12-01 NOTE — Discharge Instructions (Signed)
The results of your MRI were reassuring.  I spoke with neurology who recommended keeping your appointment with your outpatient neurologist.  They recommended please remaining hydrated.  I would like for you to return to the emergency department sooner if you experience worsening weakness or numbness in your arms or legs, slurred speech, worsening fainting spells, seizure-like activity, or any other concerns you might have.

## 2021-12-01 NOTE — ED Notes (Signed)
Patient transported to MRI 

## 2021-12-01 NOTE — ED Provider Notes (Signed)
[facility] Provider Note   CSN: [id number] Arrival date & time: 11/30/21  1940     History Chief Complaint  Patient presents with   Tremors   Nausea   Dizziness    Janice Hanson is a 56 y.o. female with hx of IH requiring annual lumbar punctures, hypertension, hyperlipidemia who presents to the emergency department with tremors which started yesterday.  Patient had a lumbar puncture back in December and since then she has been having intermittent traveling paresthesias, dizziness, syncope, and tinnitus.  [facility] for similar symptoms but was not having tremors at that time.  CT venogram of the head was performed at that time which was negative.  There was question about performing an MRI of the brain without contrast if the patient did not improve during her stay in the emergency department.  However, patient ultimately got better and she was discharged with neurology follow-up.  Per the daughter at bedside who provides most of the history, they followed up with her primary care doctor who referred him to a neurologist and has an appointment scheduled for the 17th of this month.  Starting yesterday, daughter states that she has been having these tremors that occur primarily in the face where she gets very tense and has spasms.  This happens in the face and neck.  All the symptoms that she has been having since December only happen when she is lying upright.  The symptoms improve or resolve when she is under lying supine or on her side.  Patient was seen and evaluated by ENT yesterday secondary to her tinnitus and was told that her ears are normal.  Patient Active Problem List   Diagnosis Date Noted   Medial meniscus tear 10/10/2016   Insufficiency fracture of tibia 10/10/2016   Hereditary and idiopathic peripheral neuropathy 01/25/2016   B12 deficiency 01/25/2016   Pre-diabetes 01/25/2016   Syncope  10/31/2015   Helicobacter pylori gastritis 11/20/2014   Chest pain, atypical 08/16/2014   SOB (shortness of breath) 08/16/2014   Obese 08/16/2014   Pseudotumor cerebri 08/16/2014   Blindness 08/16/2014   Chest pain 08/16/2014   GERD (gastroesophageal reflux disease) 08/16/2014      Dizziness     Home Medications Prior to Admission medications   Medication Sig Start Date End Date Taking? Authorizing Provider  acetaminophen (TYLENOL) 500 MG tablet Take 500 mg by mouth every 6 (six) hours as needed for moderate pain or headache.   Yes [provider]  amLODipine (NORVASC) 10 MG tablet Take 10 mg by mouth daily. 10/04/21  Yes [provider]  omeprazole (PRILOSEC) 40 MG capsule Take 40 mg by mouth daily.   Yes [provider]  vitamin B-12 (CYANOCOBALAMIN) 1000 MCG tablet Take 1,000 mcg by mouth daily.   Yes [provider]  ciprofloxacin-dexamethasone (CIPRODEX) OTIC suspension 4 drops See admin instructions. Bid x 7 days Patient not taking: Reported on 12/01/2021    [provider]      Allergies    Patient has no known allergies.    Review of Systems   Review of Systems  Neurological:  Positive for dizziness.  All other systems reviewed and are negative.  Physical Exam Updated Vital Signs BP 130/82    Pulse (!) 58    Temp 98.2 F (36.8 C) (Oral)    Resp 16    SpO2 100%  Physical Exam Vitals and nursing note reviewed.  Constitutional:      General: She is not in acute distress.    Appearance: Normal appearance.  HENT:     Head: Normocephalic and atraumatic.  Eyes:     General:        Right eye: No discharge.        Left eye: No discharge.  Cardiovascular:     Comments: Regular rate and rhythm.  S1/S2 are distinct without any evidence of murmur, rubs, or gallops.  Radial pulses are 2+ bilaterally.  Dorsalis pedis pulses are 2+ bilaterally.  No evidence of pedal edema. Pulmonary:     Comments: Clear to auscultation  bilaterally.  Normal effort.  No respiratory distress.  No evidence of wheezes, rales, or rhonchi heard throughout. Abdominal:     General: Abdomen is flat. Bowel sounds are normal. There is no distension.     Tenderness: There is no abdominal tenderness. There is no guarding or rebound.  Musculoskeletal:        General: Normal range of motion.     Cervical back: Neck supple.  Skin:    General: Skin is warm and dry.     Findings: No rash.  Neurological:     General: No focal deficit present.     Mental Status: She is alert.     Comments: Cranial nerves II through XII are intact.  Unable to perform extraocular movements as patient is blind and unable to follow commands.  However, patient is able to look around the room spontaneously without any obvious signs of entrapment or palsy. 5/5 strength to the upper and lower extremities.  Normal sensation to the upper and lower extremities.  No visible spasms.  Psychiatric:        Mood and Affect: Mood normal.        Behavior: Behavior normal.    ED Results / Procedures / Treatments   Labs (all labs ordered are listed, but only abnormal results are displayed) Labs Reviewed  COMPREHENSIVE METABOLIC PANEL - Abnormal; Notable for the following components:      Result Value   Glucose, Bld 125 (*)    Albumin 3.2 (*)    All other components within normal limits  CBC WITH DIFFERENTIAL/PLATELET - Abnormal; Notable for the following components:   RBC 5.24 (*)    All other components within normal limits  MAGNESIUM    EKG None  Radiology CT HEAD WO CONTRAST ( )  Result Date: 11/30/2021 CLINICAL DATA:  Tremor, nausea, dizziness EXAM: CT HEAD WITHOUT CONTRAST TECHNIQUE: Contiguous axial images were obtained from the base of the skull through the vertex without intravenous contrast. RADIATION DOSE REDUCTION: This exam was performed according to the departmental dose-optimization program which includes automated exposure control, adjustment of the  mA and/or kV according to patient size and/or use of iterative reconstruction technique. COMPARISON:  None. FINDINGS: Brain: Normal anatomic configuration. No abnormal intra or extra-axial mass lesion or fluid collection. No abnormal mass effect or midline shift. No evidence of acute intracranial hemorrhage or infarct. Ventricular size is normal. Cerebellum unremarkable. Vascular: Unremarkable Skull: Intact Sinuses/Orbits: Paranasal sinuses are clear. Orbits are unremarkable. Other: Mastoid air cells and middle ear cavities are clear. IMPRESSION: Normal examination.  No acute abnormality. Electronically Signed   By: Helyn Numbers M.D.   On: 11/30/2021 20:44   MR BRAIN WO CONTRAST  Result Date: 12/01/2021 CLINICAL DATA:  Tremors, headache, dizziness, numbness, and weakness. History of idiopathic intracranial hypertension. EXAM: MRI HEAD WITHOUT CONTRAST TECHNIQUE: Multiplanar, multiecho pulse sequences of  the brain and surrounding structures were obtained without intravenous contrast. COMPARISON:  Head CT 11/30/2021 and MRI 11/21/2020 FINDINGS: Brain: There is no evidence of an acute infarct, intracranial hemorrhage, mass, midline shift, or extra-axial fluid collection. An enlarged, partially empty sella is unchanged from the prior MRI. Small T2 hyperintensities in the subcortical and deep cerebral white matter bilaterally, greatest in the frontal lobes, are unchanged from the prior MRI and are mildly advanced for age. The ventricles and sulci are normal. The cerebellar tonsils are normally positioned. Vascular: Major intracranial vascular flow voids are preserved. Skull and upper cervical spine: Unremarkable bone marrow signal. Sinuses/Orbits: Unremarkable orbits. Paranasal sinuses and mastoid air cells are clear. Other: None. IMPRESSION: 1. No acute intracranial abnormality. 2. Unchanged mild cerebral white matter T2 hyperintensities, nonspecific though may reflect chronic small vessel ischemia, migraines, or  prior infection/inflammation. 3. Partially empty sella which can be seen with idiopathic intracranial hypertension. Electronically Signed   By: Sebastian Ache M.D.   On: 12/01/2021 09:28    Procedures Procedures  Cardiac telemetry reveals normal sinus rhythm with brief episodes of bradycardia in the high 50s.  Respirations normal she is oxygenating well on room air.  Medications Ordered in ED Medications - No data to display  ED Course/ Medical Decision Making/ A&P                           Medical Decision Making Amount and/or Complexity of Data Reviewed Labs: ordered. Radiology: ordered.   Valen Glassner is a 56 y.o. female with significant comorbidities impact her care today to include intracranial hypertension requiring serial lumbar punctures, hypertension, and hyperlipidemia who presents to the emergency department with intermittent paresthesias, tremors, dizziness, and syncope spells.  Differential diagnosis includes possible space-occupying lesion, MS, exacerbation of IH.  I considered however do believe stroke or hemorrhage is less likely at this time.  Per old records, there was question about getting MRI without contrast of the brain back in January.  Patient is having worsening symptoms at this time I do feel that an MRI would be beneficial.  I spoke with radiology about the case and they agree that MRI brain without contrast would be the best test moving forward to help evaluate some of the above diagnoses on my differential.  Overall, the patient is resting comfortably and in no acute distress at this time.  Her neurological exam is completely normal at the bedside however it is limited.  Initial work-up was ordered in triage to include basic labs and a CT head without contrast.  I personally reviewed the labs which did not show any significant abnormalities at this time.  No electrolyte abnormalities to explain her tremors.  Calcium was normal.  I will add on a mag level.  I  personally reviewed CT head without contrast which did not show any significant abnormalities.  I will plan to reassess.  MRI brain without contrast is ordered.  I personally reviewed the MRI results and did not see any obvious space-occupying lesions, obvious stroke, or signs of MS.  Radiologist interpretation states that it is unchanged from previous.  I consulted Dr. Viviann Spare with neurology given the complexity of the case and they recommended outpatient follow-up. He mentioned that she might have a small leak which would be beneficial for her considering that she does have IH.  Given the clinical scenario, do believe the patient is safe for outpatient follow-up.  As I mentioned previously she does have  a follow-up on the 17th of this month with neurology.  I will have them keep that appointment. I discussed all of the results at the bedside patient and family.  Strict return precautions were given including returning to the emergency department for new or worsening weakness or numbness, seizure-like activity, slurred speech, severe headache, or any other concerns they might have.  She safe discharge.   Final Clinical Impression(s) / ED Diagnoses Final diagnoses:  Dizziness    Rx / DC Orders ED Discharge Orders     None         Teressa Lower, [place] 12/01/21 1003    Tegeler, Canary Brim, MD 12/01/21 1228

## 2021-12-10 ENCOUNTER — Ambulatory Visit
Admission: RE | Admit: 2021-12-10 | Discharge: 2021-12-10 | Disposition: A | Discharge status: Home / Self Care | Payer: Medicaid Other | Source: Ambulatory Visit | Attending: Neurology | Admitting: Neurology

## 2021-12-10 ENCOUNTER — Other Ambulatory Visit: Payer: Self-pay | Admitting: Neurology

## 2021-12-10 ENCOUNTER — Encounter: Payer: Self-pay | Admitting: Neurology

## 2021-12-10 ENCOUNTER — Other Ambulatory Visit: Payer: Self-pay

## 2021-12-10 ENCOUNTER — Ambulatory Visit: Payer: Medicaid Other | Admitting: Neurology

## 2021-12-10 VITALS — BP 106/72 | HR 67 | Ht 67.0 in | Wt 266.4 lb

## 2021-12-10 DIAGNOSIS — G932 Benign intracranial hypertension: Secondary | ICD-10-CM

## 2021-12-10 DIAGNOSIS — H4720 Unspecified optic atrophy: Secondary | ICD-10-CM

## 2021-12-10 DIAGNOSIS — H547 Unspecified visual loss: Secondary | ICD-10-CM | POA: Diagnosis not present

## 2021-12-10 DIAGNOSIS — G971 Other reaction to spinal and lumbar puncture: Secondary | ICD-10-CM | POA: Diagnosis not present

## 2021-12-10 HISTORY — DX: Other reaction to spinal and lumbar puncture: G97.1

## 2021-12-10 IMAGING — XA Imaging study
2 series · 2 of 2 positions shown · non-contrast
Comparison: none

CLINICAL DATA: Chronic spinal headache since lumbar puncture in
[REDACTED].

[Series 1: ortho adipose · 1 of 1 slices shown (1 of 2)]
[im 1/1]
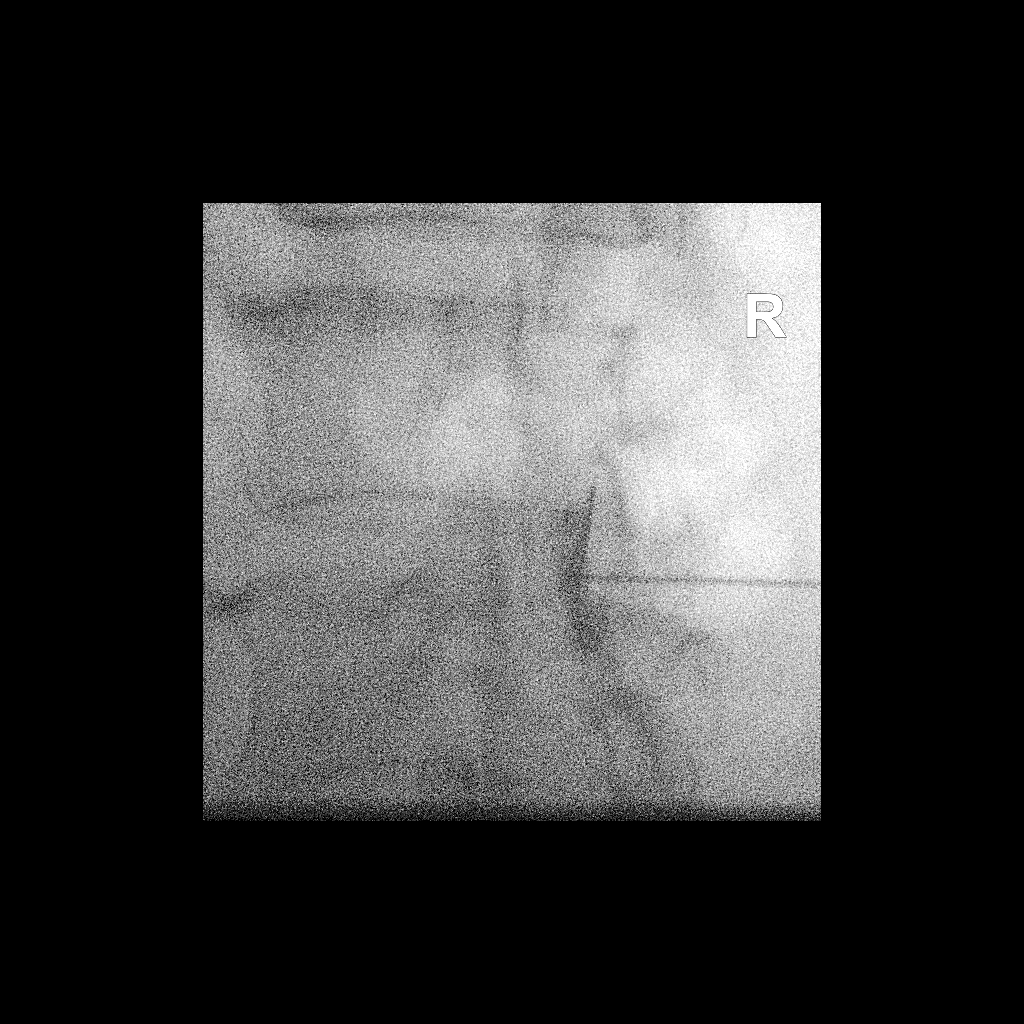

[Series 2: ortho adipose · 1 of 1 slices shown (2 of 2)]
[im 1/1]
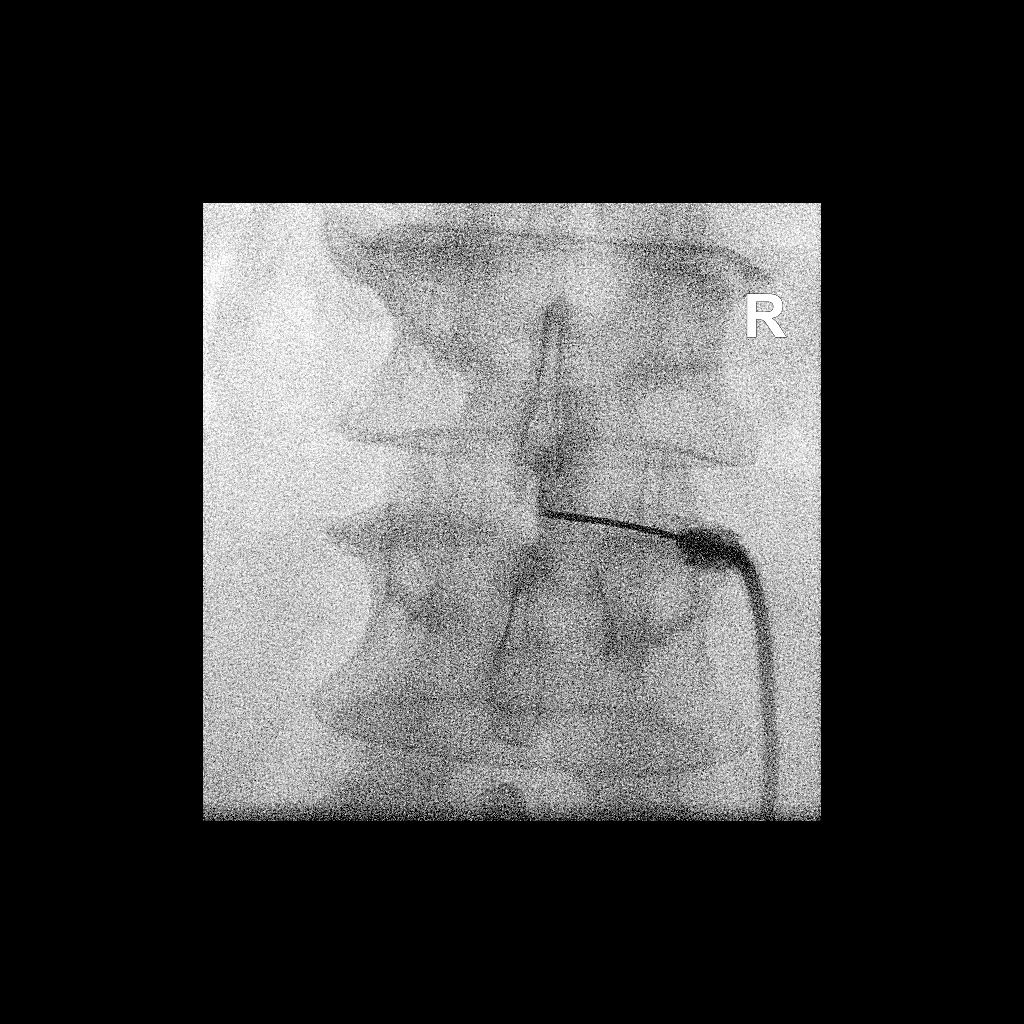

[2 of 2 positions shown; findings below may reference images not displayed]

FLUOROSCOPY TIME:  Radiation Exposure Index (as provided by the
fluoroscopic device): 3.3 mGy Kerma

PROCEDURE:
LUMBAR EPIDURAL BLOOD PATCH INJECTION

After a thorough discussion of risks and benefits of the procedure,
written and verbal consent was obtained.

Prior to the procedure, 20 ml of the patient's blood was harvested
using stringent sterile technique.

An interlaminar approach was performed at L2-L3 via a right
interlaminar approach. Under stringent sterile technique, overlying
skin was cleansed with betadine soap and anesthetized with 1%
lidocaine without epinephrine. A 3.5 inch 20 gauge needle was
advanced using loss-of-resistance technique.

DIAGNOSTIC EPIDURAL INJECTION

Injection of Isovue-M 200 shows a good epidural pattern with spread
above and below the level of needle placement, primarily on the side
of needle placement. No vascular or subarachnoid opacification is
seen.

THERAPEUTIC EPIDURAL INJECTION

15 ml of the patient's blood was injected into the epidural space at
the site of prior lumbar puncture.
IMPRESSION: Technically successful lumbar blood patch at L2-L3.

## 2021-12-10 MED ORDER — IOPAMIDOL (ISOVUE-M 200) INJECTION 41%
1.0000 mL | Freq: Once | INTRAMUSCULAR | Status: AC
  Administered 2021-12-10: 1 mL via EPIDURAL

## 2021-12-10 NOTE — Progress Notes (Signed)
[id number]    Provider:  Dr Jaynee Eagles Referring Provider: Elwyn Reach, MD Primary Care Physician:  Barbette Merino, MD  CC:  IDIOPATHIC INTRACRANIAL HYPERTENSION  12/10/2021: This is a 56 year old patient with a past medical history of remote IIH with secondary optic nerve atrophy, we saw her last in 2022, at that time she was no longer on Diamox and multiple LPs had show normal opening pressures.  LPs and MRIs had been performed for years for surveillance.   Daughter and fatima interpreter is here today. Opening presure 09/2021 showed opening pressure 24. Since the lumbar puncture, worsening headaches, dizziness, ringing in the ears, dizziness is worse than the headache, worse when sitting and standing. We need a blood patch, discussed with family. We called [facility] multiple times, explained what a blood patch it, explained why she has been having positional headaches since LP. She has remote IDIOPATHIC INTRACRANIAL HYPERTENSION, has been having years of LPs, remote not current last OP 24, optic atrophy.  IMPRESSION 11/2021: MRI  of the brain personally reviewed(additional 15 minutes) and agree with findings 1. No acute intracranial abnormality. 2. Unchanged mild cerebral white matter T2 hyperintensities, nonspecific though may reflect chronic small vessel ischemia, migraines, or prior infection/inflammation. 3. Partially empty sella which can be seen with idiopathic intracranial hypertension.  Patient complains of symptoms per HPI as well as the following symptoms: positional headache since spinal tap . Pertinent negatives and positives per HPI. All others negative  HPI 11/2020: This is a 56 year old patient with a past medical history of remote IIH with secondary optic nerve atrophy, we saw her back in 2017, at that time she was no longer on Diamox and multiple LPs had show normal opening pressures.  LPs had been performed over a year for surveillance.  In 2017  she denied any recent headaches or new vision changes.  Daughter provides most information and interpretation, she feels numbness in the back of the head, tingling and pressure in her ears, neck pain, muscle tightness, The headaches are similar to prior but the new symptoms stared in November after an ear infection, she saw ENT and they said no infection, she has a hearing test upcoming, she has numbness in her leg in the foot, they did an xray no tear or break, no back pain. She has pain in her foot where she hit it in the heel, she has been evaluated. Discussed PT. She felt better after the fluid was taken off in the ED, she does not have a lot of headaches since,no new vision changes.     MRI brain 11/21/2020: Brain: No acute infarction, hemorrhage, hydrocephalus, extra-axial collection or mass lesion. Minimal FLAIR hyperintensity in the cerebral white matter, mainly around the frontal horns of the lateral ventricles and in the frontal white matter, similar to 2017 and related to nonspecific remote insult. Partially empty sella which could be related to chart history of pseudotumor.   Vascular: Normal flow voids   Skull and upper cervical spine: Normal marrow signal   Sinuses/Orbits: Negative   IMPRESSION: No acute finding or change from 2017. (personally reviewed and agree with findings)  I reviewed Dr. Raechel Chute notes patient was seen for headache in the emergency room at the end of last month, left parietal, tingling, gradual, similar to prior headaches, associated with numbness and vomiting, migrainous, she also reported numbness throughout her head face and neck.  She requested an MRI which was stable.  Examination was nonfocal including neurologic.  Opening pressure was 23  and closing pressure was 14 and patient felt improved.   Interval update 01/25/2016: 56 year old patient with a past medical history IIH with secondary optic nerve atrophy. She is no longer on Diamox recent LPs  have showed normal opening pressure. LPs performed every year for surveillance. She denies any recent headaches or new vision changes. Today she describes that her feet are getting smaller and she is having numbness in her toes. She also complains of muscle pain and joint pain today.  Labs tested for paresthesias at last appointment:  Ace, HIv, sed rate, ANA, rpr, b1, thyroid, hep c, heavy metals, IFE, crp, lyme. Also tested NMO antibodies which were negative.  b12 was low, 218 and she has not taken supplements despite being advised to. Needs to take 2000 g oral B12 daily for B12 deficiency. Discussed that B12 deficiency can cause numbness and paresthesias as well as dementia. hgba1c 6.3, discussed again. Diet, exercise and follow up with primary care  Will repeat lumbar puncure as well. [place]aving [place]. Would like LP next week.   Interval Update 11/04/2015: She has a weird feeling in the back of her right knee. It is very painful. Her toes are "separating". No tingling or burning in the feet. But the toes are "separating". She has pain on the top of the feet when she presses on it. No numbness, no burning, no tingling. She had tingling last time but it has gone away. Pain in the knee started 1 month - 3 weeks ago.  She has had 3 headaches in the last month. They last a day or two. She has some shooting pain in the feet and tingling. She snores at night. But not excessively and not excessively tired during the day. Sleep eval has been normal in the past    HPI:  Janice Hanson is a 56 y.o. female here as a referral from Dr. Jonelle Sidle for pseudotumor cerebri. She has had LPs with opening pressure of 26 in July 2015 and opening pressure of 19.5 in June of 2016. Her daughter is an interpreter and they decline any cone interpreters and sign a release form. Husband also in the room and provides information. 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 She has been treated with different steroid injections. She was treated with diamox. She was on diamox for 2 months and then stopped. She stopped the Diamox on her own because it was making her urinate. She stopped the diamox in 2002 and has not been treated since then. She was seen years ago by Dr. Ron Parker in the old building for at least 5 years. Vision never improved. She can see some light in the left eye. Little vision in the right eye. Now she gets yearly LPs to check the opening pressure. She has been to the emergency room three times in the last few weeks for severe hypotension, slurred speech, left-sided weakness. She is having headaches in the back of her head. Like someone is banging on her head. Like a buzzing the right side of the head twice. She snores at night. She has gained weight. She is excessively tired during the day, very sleepy. She wakes up with morning headaches. Her feet has needles in the feet. Her toes are separated. However her Epworth sleepiness scale is only a 1. She reports tingling in the feet. No other focal neurologic deficits.  Reviewed notes, labs and imaging from outside physicians, which showed:  CLINICAL DATA:  Headache and fatigue ; nausea and vomiting. Left-sided numbness and slurred speech   EXAM: CT HEAD WITHOUT CONTRAST   TECHNIQUE: Contiguous axial images were obtained from the base of the skull through the vertex without intravenous contrast.   COMPARISON:  April 17, 2015   CT of the head and 10/13/2015 (personally reviewed images and agree with the following): The ventricles are normal in size and configuration. There is no intracranial mass hemorrhage, extra-axial fluid collection, or midline shift. Gray-white compartments are normal. There is no acute infarct evident. The bony calvarium appears intact. The mastoid air cells are clear. Visualized orbits appear normal.   IMPRESSION: Study within normal limits and stable.  Review of  Systems: Patient complains of symptoms per HPI as well as the following symptoms: headache, neck pain, right foot pain . Pertinent negatives and positives per HPI. All others negative    Social History   Socioeconomic History   Marital status: Married    Spouse name: Not on file   Number of children: 4   Years of education: Not on file   Highest education level: Not on file  Occupational History   Occupation: na  Tobacco Use   Smoking status: Never   Smokeless tobacco: Never  Vaping Use   Vaping Use: Never used  Substance and Sexual Activity   Alcohol use: No    Alcohol/week: 0.0 standard drinks   Drug use: No   Sexual activity: Not on file  Other Topics Concern   Not on file  Social History Narrative   Married originally from [place]   4 children 2 girls/2 boys   Caffeine use: 1-2 cups tea daily   Lives with husband and daughter.   Right-handed.   Social Determinants of Health   Financial Resource Strain: Not on file  Food Insecurity: Not on file  Transportation Needs: Not on file  Physical Activity: Not on file  Stress: Not on file  Social Connections: Not on file  Intimate Partner Violence: Not on file    Family History  Problem Relation Age of Onset   Stomach cancer Mother    Diabetes Father    Hypertension Father    Diabetes Sister    Diabetes Brother    Diabetes Maternal Grandmother    Heart disease Maternal Grandmother    Heart disease Paternal Grandfather    Kidney Stones Maternal Uncle    High blood pressure Other    Colon cancer Neg Hx    Pseudotumor cerebri Neg Hx     Past Medical History:  Diagnosis Date   Abnormal finding in CSF    Arthritis    Blindness - both eyes 12/1992   can see shadows w/ left eye   GERD (gastroesophageal reflux disease)    Helicobacter pylori gastritis 11/20/2014   Hyperlipidemia    Hypertension    Hypothyroidism    pt denies   Kidney stones    Low blood pressure    Middle ear infection    Obesity     Pseudomembranous colitis    hx of   Pseudotumor cerebri    followed by Dr. Jaynee Eagles @ [facility]   Right knee meniscal tear    w/ lateral plateau stress fx   Spinal puncture headache 12/10/2021   Syncope 10/31/2015    Past Surgical History:  Procedure Laterality Date   KNEE ARTHROSCOPY Right 10/10/2016   Procedure: ARTHROSCOPY KNEE WITH PARTIAL MEDIAL MENISECTOMY AND LATERAL SUBCHONDROPLASTY;  Surgeon: Nicholes Stairs, MD;  Location: [facility];  Service: Orthopedics;  Laterality: Right;   TONSILLECTOMY      Current Outpatient Medications  Medication Sig Dispense Refill   acetaminophen (TYLENOL) 500 MG tablet Take 500 mg by mouth every 6 (six) hours as needed for moderate pain or headache.     amLODipine (NORVASC) 10 MG tablet Take 10 mg by mouth daily.     omeprazole (PRILOSEC) 40 MG capsule Take 40 mg by mouth daily.     No current facility-administered medications for this visit.    Allergies as of 12/10/2021   (No Known Allergies)    Vitals: BP 106/72    Pulse 67    Ht 5\' 7"  (1.702 m)    Wt 266 lb 6.4 oz (120.8 kg)    BMI 41.72 kg/m  Last Weight:  Wt Readings from Last 1 Encounters:  12/10/21 266 lb 6.4 oz (120.8 kg)   Last Height:   Ht Readings from Last 1 Encounters:  12/10/21 5\' 7"  (1.702 m)      Physical exam: stable Exam: Gen: NAD, conversant, well nourised, obese, well groomed                     CV: RRR, no MRG. No Carotid Bruits. + distal mild peripheral edema, warm, nontender Eyes: Conjunctivae clear without exudates or hemorrhage  Neuro: exam stable Detailed Neurologic Exam  Speech:    Speech is normal; fluent and spontaneous with normal comprehension.  Cognition:    The patient is oriented to person, place, and time;     recent and remote memory intact;     language fluent;     normal attention, concentration,     fund of knowledge  Cranial Nerves:    The pupils are equal, round, and reactive to light. Optic nerve  pallor and atrophy bilaterally. Can only see shadows with the right eye, can see waving hands on the left periphery and fingers left centrally (stable from my last visit) . Extraocular movements are intact. Trigeminal sensation is intact and the muscles of mastication are normal. The face is symmetric. The palate elevates in the midline. Hearing intact. Voice is normal. Shoulder shrug is normal. The tongue has normal motion without fasciculations.   Coordination:    No dysmetria or ataxia   Motor Observation:    No asymmetry, no atrophy, and no involuntary movements noted. Tone:    Normal muscle tone.    Posture:    Posture is normal. normal erect in wheelchair    Strength:    Strength is antigravity and equal in the upper and lower limbs, no focal weakness     Sensation: intact to LT, pinprick distally     Reflex Exam:  DTR's:    Deep tendon reflexes in the upper and lower extremities are symmetrical bilaterally.   Toes:    The toes are downgoing bilaterally.   Clonus:    Clonus is absent.  Assessment/Plan:   56 year old female with past medical history with a history of blindness reportedly secondary to pseudotumor cerebri. She has not been on diamox for years, multiple LPs over years have been unremarkable/stable, most recent LP was OP 24(last was 23 last year). She reported some headaches may be migrainous and started Topiramate in the past which could help with both disorders however I do think the IDIOPATHIC INTRACRANIAL HYPERTENSION has been resolved for many years.   Daughter and fatima interpreter is here today. Opening presure 09/2021 showed opening pressure 24,  MRI without acute etiology. Since the lumbar puncture, worsening headaches, dizziness, ringing in the ears, dizziness is worse than the headache, worse when standing. We need a blood patch, discussed with family. We called [facility] multiple times, explained what a blood patch it, explained why she has been  having positional headaches since LP.   Will send to ophthalmology, has seen Dr. Katy Fitch in the past, will see if any changes to her optic atrophy or vision. In the past, r. Groat recommended NMO lab testing, normal, will ask she returns to him for yearly eval.  -MRI of the brain recent: unchanged Nonspecific white matter changes and unchanged enlarged partially empty sella. No acute events  - in the past, referred Ortho and Podiatrist for foot pain left ankle after injury, sensory exam normal, no symptoms of peripheral neuropathy.  - In the past, PT for neck pain and what sounds like occipital neuralgia  -Labs tested for paresthesias in the past:  Ace, HIv, sed rate, ANA, rpr, b1, thyroid, hep c, heavy metals, IFE, crp, lyme. Also tested NMO antibodies which were negative.  - B12 was low in the past, 218 and she has not taken supplements despite being advised to. Needs to take 2000 mcg oral B12 daily for B12 deficiency. Discussed that B12 deficiency again and can cause numbness and paresthesias as well as dementia. Follow with primary care.  - hgba1c was elevated in the past, discussed again. Diet, exercise and follow up with primary care   Ophthalmology referral Orders Placed This Encounter  Procedures   Ambulatory referral to Ophthalmology     To prevent or relieve headaches, try the following: Cool Compress. Lie down and place a cool compress on your head.   Avoid headache triggers. If certain foods or odors seem to have triggered your migraines in the past, avoid them. A headache diary might help you identify triggers.   Include physical activity in your daily routine. Try a daily walk or other moderate aerobic exercise.   Manage stress. Find healthy ways to cope with the stressors, such as delegating tasks on your to-do list.   Practice relaxation techniques. Try deep breathing, yoga, massage and visualization.   Eat regularly. Eating regularly scheduled meals and maintaining a  healthy diet might help prevent headaches. Also, drink plenty of fluids.   Follow a regular sleep schedule. Sleep deprivation might contribute to headaches Consider biofeedback. With this mind-body technique, you learn to control certain bodily functions -- such as muscle tension, heart rate and blood pressure -- to prevent headaches or reduce headache pain.   Proceed to emergency room if you experience new or worsening symptoms or symptoms do not resolve, if you have new neurologic symptoms or if headache is severe, or for any concerning symptom.     Sarina Ill, MD  [facility] [address] [place], [place] [address]  Phone [phone] Fax [phone]   I spent over 40 minutes of face-to-face and non-face-to-face time with patient on the  1. Spinal puncture headache   2. history of IIH (idiopathic intracranial hypertension)   3. Optic atrophy   4. Vision loss     diagnosis.  This included previsit chart review, lab review, study review, order entry, electronic health record documentation, patient education on the different diagnostic and therapeutic options, counseling and coordination of care, risks and benefits of management, compliance, or risk factor reduction

## 2021-12-10 NOTE — Progress Notes (Signed)
20cc blood collected from pts right AC for blood patch.  Pt tolerated well. 1 successful attempt. Gauze and tape applied after.

## 2021-12-10 NOTE — Discharge Instructions (Signed)

## 2021-12-10 NOTE — Patient Instructions (Addendum)
Spinal Headache A spinal headache is a severe headache that can happen after a person has had a lumbar puncture or epidural anesthesia. During these procedures, a needle is passed between the bones of the spine. The headache usually starts from a few hours to 1-2 days after the procedure. The headache can last for a few days. In rare cases, it can last for more than a week. What are the causes? This condition is caused by a leak of spinal fluid from the spine through the hole that is left by the needle. What are the signs or symptoms? Symptoms of this condition include: A severe headache. A headache that is worse when you sit or stand and better when you lie down. Neck pain and stiffness, especially when tilting your chin toward your chest. Nausea and vomiting. Dizziness Ringing in ears How is this diagnosed? This condition is usually diagnosed based on: Your medical history. Your symptoms. A CT scan or MRI of the brain to help rule out other conditions. How is this treated? Treatment for this condition may include: Replacing fluids that leaked through the needle hole. Fluids may be replaced by: Drinking more fluids. Getting fluids through an IV. Caffeine to help reduce your headache. Your health care provider may recommend drinking caffeinated beverages such as soda, coffee, or tea. Having an epidural blood patch procedure. In this procedure, a small amount of your blood is injected into the area of the leak to seal it. Pain medicines. Resting and lying flat for a few days. Follow these instructions at home:   Take over-the-counter and prescription medicines only as told by your health care provider. Drink enough fluid to keep your urine pale yellow. Drink caffeinated beverages as told by your health care provider. Lie down to relieve pain if your pain gets worse when you sit or stand. Return to your normal activities as told by your health care provider. Ask your health care  provider what activities are safe for you. Keep all follow-up visits. This is important. Contact a health care provider if: You are nauseous and vomit. Get help right away if: Your pain becomes very severe. Your pain cannot be controlled. You develop a fever. You have a stiff neck. You have vision problems. You lose control of your bowel or bladder (have incontinence). You have trouble walking, you feel weak, or you lose feeling in part of your body. Summary A spinal headache is a severe headache that can happen after a person has had a lumbar puncture or epidural anesthesia. This condition is caused by a leak of spinal fluid from the spine through the hole that is left by the needle. The headache can last for a few days. In rare cases, it lasts for more than a week. Supportive measures, such as drinking more fluid and taking pain medicines, are usually recommended. In some cases, it may be necessary to inject a small amount of your blood into the area of the leak to seal it. This information is not intended to replace advice given to you by your health care provider. Make sure you discuss any questions you have with your health care provider. Document Revised: 06/04/2021 Document Reviewed: 06/04/2021 Elsevier Patient Education  2022 Elsevier Inc.  Epidural Blood Patch for Spinal Headache, Care After This sheet gives you information about how to care for yourself after your procedure. Your health care provider may also give you more specific instructions. If you have problems or questions, contact your health care provider.  What can I expect after the procedure? After the procedure, it is common to have: Relief from your headache almost right away, or slow easing of pain over the next 24 hours. Mild backaches. These may last for a few days. A mild feeling of prickly, tingly, or numb skin (paresthesia). Neck pain. Pain down your arm or leg (radiating pain). Follow these instructions at  home: Injection site care  Follow instructions from your health care provider about how to take care of your injection site. Make sure you: Wash your hands with soap and water before and after you change your bandage (dressing). If soap and water are not available, use hand sanitizer. Change or remove your dressing as told by your health care provider. Check your injection area every day for signs of infection. Check for: Redness, swelling, or pain. Fluid or blood. Warmth. Pus or a bad smell. Activity  For the first 24 hours after the procedure, rest in bed as much as you can. Do not drive for 24 hours if you were given a sedative during your procedure. When picking up items from a low position, squat by bending your knees. Do not bend at the waist. Avoid too much straining, including while using the toilet. This can cause the blood patch to move out of position and cause the headache to return. Do not lift anything that is heavier than 10 lb (4.5 kg), or the limit that you are told, until your health care provider says that it is safe. Return to your normal activities as told by your health care provider. Ask your health care provider what activities are safe for you. General instructions Drink enough fluids to keep your urine pale yellow. Do not take baths, swim, or use a hot tub until your health care provider approves. Ask your health care provider if you may take showers. You may only be allowed to take sponge baths. Take over-the-counter and prescription medicines only as told by your health care provider. Keep all follow-up visits as told by your health care provider. This is important. Contact a health care provider if: You have redness, swelling, or pain around your injection site. You have fluid or blood coming from your injection site. Your injection site feels warm to the touch. You have pus or a bad smell coming from your injection site. You have a fever. You have back pain  or pain that goes down your legs, or you have trouble with your bowels or bladder. Your headache comes back. Get help right away if you have: A very bad headache. Nausea or vomiting. Summary After the procedure, it is common to have relief from your headache, mild backaches, mild numbness, neck pain, and some pain down your arm or leg. Follow instructions from your health care provider about how to take care of your injection site. For the first 24 hours after the procedure, rest in bed as much as you can. Get help right away if you have a very bad headache, nausea, or vomiting. This information is not intended to replace advice given to you by your health care provider. Make sure you discuss any questions you have with your health care provider. Document Revised: 01/21/2021 Document Reviewed: 05/08/2019 Elsevier Patient Education  2022 [employer].

## 2021-12-13 ENCOUNTER — Other Ambulatory Visit: Payer: Medicaid Other

## 2021-12-13 ENCOUNTER — Telehealth: Payer: Self-pay | Admitting: Neurology

## 2021-12-13 NOTE — Telephone Encounter (Signed)
Sent to Dr. Groat ph # [phone] 

## 2021-12-20 ENCOUNTER — Telehealth: Payer: Self-pay | Admitting: Neurology

## 2021-12-20 NOTE — Telephone Encounter (Signed)
Pt's daughter has called to report that pt's symptoms have returned: Pt is hearing sounds in her right ear.  Pt feels dizzy when she bends down to pick something up and Pt has a heat sensation on her thigh.  Please call pt's daughter to discuss.

## 2021-12-20 NOTE — Telephone Encounter (Signed)
I called daughter back and relayed Dr. Jaynee Eagles Message.  Daughter had called Dr. Katy Fitch and was not able to make appt since they had not received referral.  I will let referral know.  They are booking in July 2023.

## 2021-12-20 NOTE — Telephone Encounter (Signed)
I spoke to daughter of pt.  Pt had Blood patch 12-10-2021. Since then laying down she noted resolved symptoms of dizziness when looking down, bending down, R ear tinnitus, and R thigh heat, tingling numbness improved.  Now she states that since she has been up, noted that the dizziness/ looking down, and bending down, R tinnitus, L thigh heat has returned. Headache comes and goes (takes tylenol helps). Gave # to Dr. Katy Fitch, as not seen or have called as yet. She will call them.  Please advise.

## 2021-12-21 NOTE — Telephone Encounter (Signed)
Janice Hanson with Dr. Dione Booze office stated she did receive the referral and gave them a call once, she stated she will give the patient a call again. She also stated she gives patient two calls and waits a week and if she does not hear from them she close's the referral.

## 2021-12-21 NOTE — Telephone Encounter (Signed)
I called Dr. Dione Booze office and they told me only Jasmine December does referreals and she will not be in until 10:30 AM to 11 AM.. I will call back

## 2021-12-22 NOTE — Telephone Encounter (Signed)
I sent mychart message to daughter of pt Margaretmary Lombard) to let her know that Jasmine December with Dr. Lucious Groves office will be calling.  I also relayed that she can call and ask to speak to [facility] as well to make an appointment.  (They did have referral).   ?

## 2022-01-03 NOTE — Telephone Encounter (Signed)
I called and spoke to daughter of pt.  Even with seeing Dr. Katy Fitch and ENT, both exams clear per daughter.  I called Dr. Patrici Ranks office saw on 12-30-2021 (report pending but to see her back in 2 yrs).  Pt still with tingling (generalized) comes and goes, also with looking down and bending down gets dizzy.  Not sure if resolves when resumes upright position.  Pt is well hydrated.  [facility] getting on flight), pt not with her to ask.  What can be done?? Please advise.  May call back and LM. ?

## 2022-01-03 NOTE — Telephone Encounter (Signed)
Pt's daughter, Margaretmary Lombard said pt symptoms are still the same. Have seen Dr. Dione Booze and ENT specialist.She having tingling whole, dizziness when bending down, cold sensation in legs.Would like a call from the nurse to discuss if there's something can be done to relief these symptoms She having tingling whole, dizziness when bending down, cold sensation in legs. ? ?

## 2022-01-04 NOTE — Telephone Encounter (Signed)
I called and could not LM for [facility].  Will try later.  ?

## 2022-01-04 NOTE — Telephone Encounter (Signed)
I called daughter of pt.  I relayed that per Dr. Jaynee Eagles that she could find nothing neurologically wrong with patient.  We have sent her to ENT, Ophthalmology, and she has had a recent MRI. She would ask her to follow up with primary care.  She verbalized understanding.  Appreciated call back.   ?

## 2022-01-25 ENCOUNTER — Ambulatory Visit: Payer: Medicaid Other | Admitting: Cardiology

## 2022-01-25 ENCOUNTER — Encounter: Payer: Self-pay | Admitting: Cardiology

## 2022-01-25 VITALS — BP 116/71 | HR 84 | Temp 97.4°F | Resp 17 | Ht 67.0 in | Wt 262.0 lb

## 2022-01-25 DIAGNOSIS — I10 Essential (primary) hypertension: Secondary | ICD-10-CM

## 2022-01-25 DIAGNOSIS — Z13220 Encounter for screening for lipoid disorders: Secondary | ICD-10-CM

## 2022-01-25 DIAGNOSIS — H543 Unqualified visual loss, both eyes: Secondary | ICD-10-CM

## 2022-01-25 DIAGNOSIS — E039 Hypothyroidism, unspecified: Secondary | ICD-10-CM

## 2022-01-25 DIAGNOSIS — R7303 Prediabetes: Secondary | ICD-10-CM

## 2022-01-25 DIAGNOSIS — R0989 Other specified symptoms and signs involving the circulatory and respiratory systems: Secondary | ICD-10-CM

## 2022-01-25 DIAGNOSIS — R55 Syncope and collapse: Secondary | ICD-10-CM

## 2022-01-25 NOTE — Progress Notes (Signed)
? ?ID:  Janice Hanson, DOB 12-23-1965, MRN [id number] ? ?PCP:  Elwyn Reach, MD  ?Cardiologist:  Rex Kras, DO, [facility] (established care January 25, 2022) ? ?REASON FOR CONSULT: Syncope/dizziness ? ?REQUESTING PHYSICIAN:  ?Elwyn Reach, MD ?[address] ?[place],  [place] [id number] ? ?Chief Complaint  ?Patient presents with  ? New Patient (Initial Visit)  ? Loss of Consciousness  ? ? ?HPI  ?Janice Hanson is a 56 y.o. Arabic female whose past medical history and cardiovascular risk factors include: History of pseudotumor cerebri leading to intracranial hypertension (per daughter), bilateral eye blindness, hypertension, hypothyroidism, arthritis, GERD, obesity due to excess calories. ? ?Patient was referred to the practice for evaluation of possible syncope/dizziness.  She has been having episodes of fainting / dizziness and has tremors per referring physician and therefore cardiovascular consultation was requested.  Patient is accompanied by her daughter who also serves as an Astronomer during today's encounter. ? ?Patient's daughter states that mom has been having health issues since December 2022.  Given her history of pseudotumor cerebri and intracranial hypertension she usually has a scheduled spinal tap once a year.  Last 1 was scheduled in December 2022.  Since then she has been experiencing lightheadedness, dizziness, ringing in the ears (which she described as an engine sound).  Since then she has been evaluated by ENT, neurology, and ophthalmology. ? ?Now referred to cardiology for evaluation of dizziness.  Patient denies any prior history of carotid disease or aortic stenosis or other valvular heart disease.  Her dizziness is usually secondary to changing positions or looking up or down.  She denies syncope but at times has experienced near syncope. ? ?Patient denies any active chest pain or heart failure symptoms. ? ?FUNCTIONAL STATUS: ?No structured exercise program or daily routine.   ? ?ALLERGIES: ?No Known Allergies ? ?MEDICATION LIST PRIOR TO VISIT: ?Current Meds  ?Medication Sig  ? acetaminophen (TYLENOL) 500 MG tablet Take 500 mg by mouth every 6 (six) hours as needed for moderate pain or headache.  ? Cyanocobalamin (B-12 PO) Take 1 tablet by mouth daily.  ? omeprazole (PRILOSEC) 40 MG capsule Take 40 mg by mouth daily.  ? [DISCONTINUED] amLODipine (NORVASC) 10 MG tablet Take 10 mg by mouth daily.  ?  ? ?PAST MEDICAL HISTORY: ?Past Medical History:  ?Diagnosis Date  ? Abnormal finding in CSF   ? Arthritis   ? 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 can see shadows w/ left eye  ? GERD (gastroesophageal reflux disease)   ? Helicobacter pylori gastritis 11/20/2014  ? Hyperlipidemia   ? Hypertension   ? Hypothyroidism   ? pt denies  ? Kidney stones   ? Low blood pressure   ? Middle ear infection   ? Obesity   ? Pseudomembranous colitis   ? hx of  ? Pseudotumor cerebri   ? followed by Dr. Jaynee Eagles @ [facility]  ? Right knee meniscal tear   ? w/ lateral plateau stress fx  ? Spinal puncture headache 12/10/2021  ? Syncope 10/31/2015  ? ? ?PAST SURGICAL HISTORY: ?Past Surgical History:  ?Procedure Laterality Date  ? KNEE ARTHROSCOPY Right 10/10/2016  ? Procedure: ARTHROSCOPY KNEE WITH PARTIAL MEDIAL MENISECTOMY AND LATERAL SUBCHONDROPLASTY;  Surgeon: Nicholes Stairs, MD;  Location: [facility];  Service: Orthopedics;  Laterality: Right;  ? TONSILLECTOMY    ? ? ?FAMILY HISTORY: ?The patient family history includes Diabetes in her brother, father, and maternal grandmother; Heart disease in her  maternal grandmother and paternal grandfather; High blood pressure in her brother; Hypertension in her father; Kidney Stones in her maternal uncle; Kidney disease in her brother; Stomach cancer in her mother. ? ?SOCIAL HISTORY:  ?The patient  reports that she has never smoked. She has never used smokeless tobacco. She reports that she does not drink alcohol and does not use drugs. ? ?REVIEW OF  SYSTEMS: ?Review of Systems  ?Cardiovascular:  Positive for near-syncope. Negative for chest pain, dyspnea on exertion, leg swelling, palpitations, paroxysmal nocturnal dyspnea and syncope.  ?Respiratory:  Negative for shortness of breath.   ? ?PHYSICAL EXAM: ? ?  01/25/2022  ?  1:28 PM 12/10/2021  ? 12:21 PM 12/10/2021  ?  8:35 AM  ?Vitals with BMI  ?Height _0   _1   ?Weight 262 lbs  266 lbs 6 oz  ?BMI 41.03  41.71  ?Systolic 409 811 914  ?Diastolic 71 97 72  ?Pulse 84 62 67  ? ?Orthostatic VS for the past 72 hrs (Last 3 readings): ? Orthostatic BP Patient Position BP Location Cuff Size Orthostatic Pulse  ?01/25/22 1339 98/82 Standing Left Arm Large 93  ?01/25/22 1336 110/80 Sitting Left Arm Large 80  ?01/25/22 1329 122/76 Supine Left Arm Large 76  ? ?CONSTITUTIONAL: Age-appropriate female, hemodynamically stable, no acute distress.   ?SKIN: Skin is warm and dry. No rash noted. No cyanosis. No pallor. No jaundice ?HEAD: Normocephalic and atraumatic.  ?EYES: No scleral icterus ?MOUTH/THROAT: Moist oral membranes.  ?NECK: No JVD present. No thyromegaly noted.  Faint bilateral carotid bruits  ?CHEST Normal respiratory effort. No intercostal retractions  ?LUNGS: Clear to auscultation bilaterally.  No stridor. No wheezes. No rales.  ?CARDIOVASCULAR: Regular rate and rhythm, positive S1-S2, no murmurs rubs or gallops appreciated. ?ABDOMINAL: Obese, soft, nontender, nondistended, positive bowel sounds all 4 quadrants. No apparent ascites.  ?EXTREMITIES: No peripheral edema, warm to touch, 2+ bilateral DP and PT pulses ?HEMATOLOGIC: No significant bruising ?NEUROLOGIC: Oriented to person, place, and time. Nonfocal. Normal muscle tone.  ?PSYCHIATRIC: Normal mood and affect. Normal behavior. Cooperative ? ?CARDIAC DATABASE: ?EKG: ?January 25, 2022: NSR, 77 bpm, without underlying ischemia or injury pattern.   ? ?Echocardiogram: ?No results found for this or any previous visit from the past 1095 days. ?  ? ?Stress Testing: ?No  results found for this or any previous visit from the past 1095 days. ? ? ?Heart Catheterization: ?None ? ?LABORATORY DATA: ? ?  Latest Ref Rng & Units 11/30/2021  ?  7:32 PM 11/13/2021  ? 12:59 AM 11/21/2020  ? 12:31 AM  ?CBC  ?WBC 4.0 - 10.5 K/uL 6.2   5.2   5.6    ?Hemoglobin 12.0 - 15.0 g/dL 14.8   14.4   13.9    ?Hematocrit 36.0 - 46.0 % 44.1   43.1   41.0    ?Platelets 150 - 400 K/uL 296   270   301    ? ? ? ?  Latest Ref Rng & Units 11/30/2021  ?  7:32 PM 11/13/2021  ? 12:59 AM 11/21/2020  ? 12:31 AM  ?CMP  ?Glucose 70 - 99 mg/dL 125   128   134    ?BUN 6 - 20 mg/dL _2 ?Creatinine 0.44 - 1.00 mg/dL 0.84   0.62   0.73    ?Sodium 135 - 145 mmol/L 140   139   138    ?Potassium 3.5 - 5.1 mmol/L 3.8  3.5   3.1    ?Chloride 98 - 111 mmol/L 106   106   105    ?CO2 22 - 32 mmol/L _0 ?Calcium 8.9 - 10.3 mg/dL 8.9   8.8   8.8    ?Total Protein 6.5 - 8.1 g/dL 7.2   7.7     ?Total Bilirubin 0.3 - 1.2 mg/dL 0.3   0.6     ?Alkaline Phos 38 - 126 U/L 78   82     ?AST 15 - 41 U/L 20   18     ?ALT 0 - 44 U/L 17   16     ? ? ?Lipid Panel  ?No results found for: CHOL, TRIG, HDL, CHOLHDL, VLDL, LDLCALC, LDLDIRECT, LABVLDL ? ?No components found for: NTPROBNP ?No results for input(s): PROBNP in the last 8760 hours. ?No results for input(s): TSH in the last 8760 hours. ? ?BMP ?Recent Labs  ?  11/13/21 ?7622 11/30/21 ?1932  ?NA 139 140  ?K 3.5 3.8  ?CL 106 106  ?CO2 30 26  ?GLUCOSE 128* 125*  ?BUN 7 10  ?CREATININE 0.62 0.84  ?CALCIUM 8.8* 8.9  ?GFRNONAA >60 >60  ? ? ?HEMOGLOBIN A1C ?Lab Results  ?Component Value Date  ? HGBA1C 6.3 (H) 10/07/2015  ? ? ?IMPRESSION: ? ?  ICD-10-CM   ?1. Near syncope  R55 EKG 12-Lead  ?  ?2. Benign hypertension  I10 PCV ECHOCARDIOGRAM COMPLETE  ?  ?3. Acquired hypothyroidism  E03.9 TSH  ?  ?4. Blind in both eyes  H54.3   ?  ?5. Bilateral carotid bruits  R09.89 PCV CAROTID DUPLEX (BILATERAL)  ?  ?6. Screening for hyperlipidemia  Z13.220 Lipid Panel With LDL/HDL Ratio  ?  LDL  cholesterol, direct  ?  CMP14+EGFR  ?  ?7. Prediabetes  R73.03   ?  ?  ? ?RECOMMENDATIONS: ?Maleaha Hughett is a 56 y.o. Arabic female whose past medical history and cardiac risk factors include: History of pseudotumor

## 2022-01-26 LAB — CMP14+EGFR
ALT: 15 IU/L (ref 0–32)
AST: 17 IU/L (ref 0–40)
Albumin/Globulin Ratio: 1 — ABNORMAL LOW (ref 1.2–2.2)
Albumin: 4 g/dL (ref 3.8–4.9)
Alkaline Phosphatase: 110 IU/L (ref 44–121)
BUN/Creatinine Ratio: 11 (ref 9–23)
BUN: 10 mg/dL (ref 6–24)
Bilirubin Total: 0.3 mg/dL (ref 0.0–1.2)
CO2: 26 mmol/L (ref 20–29)
Calcium: 9.7 mg/dL (ref 8.7–10.2)
Chloride: 104 mmol/L (ref 96–106)
Creatinine, Ser: 0.89 mg/dL (ref 0.57–1.00)
Globulin, Total: 4 g/dL (ref 1.5–4.5)
Glucose: 127 mg/dL — ABNORMAL HIGH (ref 70–99)
Potassium: 4.4 mmol/L (ref 3.5–5.2)
Sodium: 141 mmol/L (ref 134–144)
Total Protein: 8 g/dL (ref 6.0–8.5)
eGFR: 76 mL/min/{1.73_m2} (ref >59)

## 2022-01-26 LAB — LDL CHOLESTEROL, DIRECT: LDL Direct: 127 mg/dL — ABNORMAL HIGH (ref 0–99)

## 2022-01-26 LAB — LIPID PANEL WITH LDL/HDL RATIO
Cholesterol, Total: 201 mg/dL — ABNORMAL HIGH (ref 100–199)
HDL: 42 mg/dL (ref >39)
LDL Chol Calc (NIH): 136 mg/dL — ABNORMAL HIGH (ref 0–99)
LDL/HDL Ratio: 3.2 ratio (ref 0.0–3.2)
Triglycerides: 129 mg/dL (ref 0–149)
VLDL Cholesterol Cal: 23 mg/dL (ref 5–40)

## 2022-01-26 LAB — TSH: TSH: 1.59 u[IU]/mL (ref 0.450–4.500)

## 2022-01-27 NOTE — Progress Notes (Signed)
Called and spoke to pts daughter. She voiced understanding and will female the pt aware.

## 2022-02-01 ENCOUNTER — Ambulatory Visit: Payer: Medicaid Other

## 2022-02-01 DIAGNOSIS — R0989 Other specified symptoms and signs involving the circulatory and respiratory systems: Secondary | ICD-10-CM

## 2022-02-01 DIAGNOSIS — I10 Essential (primary) hypertension: Secondary | ICD-10-CM

## 2022-02-04 NOTE — Progress Notes (Signed)
Called and spoke to patient she voiced understanding

## 2022-02-22 ENCOUNTER — Encounter: Payer: Self-pay | Admitting: Cardiology

## 2022-02-22 ENCOUNTER — Ambulatory Visit: Payer: Medicaid Other | Admitting: Cardiology

## 2022-02-22 VITALS — BP 123/74 | HR 78 | Temp 97.8°F | Resp 16 | Ht 67.0 in | Wt 261.2 lb

## 2022-02-22 DIAGNOSIS — G932 Benign intracranial hypertension: Secondary | ICD-10-CM

## 2022-02-22 DIAGNOSIS — H543 Unqualified visual loss, both eyes: Secondary | ICD-10-CM

## 2022-02-22 DIAGNOSIS — E7800 Pure hypercholesterolemia, unspecified: Secondary | ICD-10-CM

## 2022-02-22 DIAGNOSIS — I10 Essential (primary) hypertension: Secondary | ICD-10-CM

## 2022-02-22 DIAGNOSIS — E039 Hypothyroidism, unspecified: Secondary | ICD-10-CM

## 2022-02-22 DIAGNOSIS — R7303 Prediabetes: Secondary | ICD-10-CM

## 2022-02-22 DIAGNOSIS — R55 Syncope and collapse: Secondary | ICD-10-CM

## 2022-02-22 NOTE — Progress Notes (Signed)
? ?ID:  Janice Hanson, DOB 11-Aug-1966, MRN [id number] ? ?PCP:  Elwyn Reach, MD  ?Cardiologist:  Rex Kras, DO, [facility] (established care January 25, 2022) ? ?Date: 02/22/22 ?Last Office Visit: 01/25/2022 ? ?Chief Complaint  ?Patient presents with  ? Near Syncope  ? Results  ? Follow-up  ? ? ?HPI  ?Janice Hanson is a 56 y.o. Arabic female whose past medical history and cardiovascular risk factors include: History of pseudotumor cerebri leading to intracranial hypertension (per daughter), bilateral eye blindness, hypertension, hypothyroidism, arthritis, GERD, obesity due to excess calories. ? ?She was referred to the practice for evaluation of possible syncope/dizziness.  Based on symptoms she has been having episodes of near syncope and also carries a diagnosis of pseudotumor cerebri.  Given her symptoms she has been evaluated by neurology, ENT, and ophthalmology.  And now referred to cardiology for further evaluation and management. ? ?Given her blood pressure and symptoms of dizziness/lightheadedness I have asked her to hold off on amlodipine.  After the intervention patient states that she feels better and the frequency of lightheaded and dizziness have improved but not resolved.  She denies any near-syncope or syncopal events since last office visit.  Home blood pressure remains stable. ? ?She has undergone echocardiogram and carotid duplex with results reviewed with both her and her daughter at today's office visit.  Given her history of prediabetes I also screened her for hyperlipidemia, labs independently reviewed with both the patient and her daughter at today's visit and noted below for further reference. ? ?Denies angina pectoris or heart failure symptoms. ? ?FUNCTIONAL STATUS: ?No structured exercise program or daily routine.  ? ?ALLERGIES: ?No Known Allergies ? ?MEDICATION LIST PRIOR TO VISIT: ?Current Meds  ?Medication Sig  ? acetaminophen (TYLENOL) 500 MG tablet Take 500 mg by mouth every 6 (six)  hours as needed for moderate pain or headache.  ? Cyanocobalamin (B-12 PO) Take 1 tablet by mouth daily.  ? omeprazole (PRILOSEC) 40 MG capsule Take 40 mg by mouth daily.  ?  ? ?PAST MEDICAL HISTORY: ?Past Medical History:  ?Diagnosis Date  ? Abnormal finding in CSF   ? Arthritis   ? 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 can see shadows w/ left eye  ? GERD (gastroesophageal reflux disease)   ? Helicobacter pylori gastritis 11/20/2014  ? Hyperlipidemia   ? Hypertension   ? Hypothyroidism   ? pt denies  ? Kidney stones   ? Low blood pressure   ? Middle ear infection   ? Obesity   ? Pseudomembranous colitis   ? hx of  ? Pseudotumor cerebri   ? followed by Dr. Jaynee Eagles @ [facility]  ? Right knee meniscal tear   ? w/ lateral plateau stress fx  ? Spinal puncture headache 12/10/2021  ? Syncope 10/31/2015  ? ? ?PAST SURGICAL HISTORY: ?Past Surgical History:  ?Procedure Laterality Date  ? KNEE ARTHROSCOPY Right 10/10/2016  ? Procedure: ARTHROSCOPY KNEE WITH PARTIAL MEDIAL MENISECTOMY AND LATERAL SUBCHONDROPLASTY;  Surgeon: Nicholes Stairs, MD;  Location: [facility];  Service: Orthopedics;  Laterality: Right;  ? TONSILLECTOMY    ? ? ?FAMILY HISTORY: ?The patient family history includes Diabetes in her brother, father, and maternal grandmother; Heart disease in her maternal grandmother and paternal grandfather; High blood pressure in her brother; Hypertension in her father; Kidney Stones in her maternal uncle; Kidney disease in her brother; Stomach cancer in her mother. ? ?SOCIAL HISTORY:  ?The patient  reports that she has never  smoked. She has never used smokeless tobacco. She reports that she does not drink alcohol and does not use drugs. ? ?REVIEW OF SYSTEMS: ?Review of Systems  ?Cardiovascular:  Negative for chest pain, dyspnea on exertion, leg swelling, near-syncope, palpitations, paroxysmal nocturnal dyspnea and syncope.  ?Respiratory:  Negative for shortness of breath.   ?Neurological:  Positive for  dizziness (improving) and light-headedness (improving).  ? ?PHYSICAL EXAM: ? ?  02/22/2022  ?  2:37 PM 01/25/2022  ?  1:28 PM 12/10/2021  ? 12:21 PM  ?Vitals with BMI  ?Height 5\' 7"  5\' 7"    ?Weight 261 lbs 3 oz 262 lbs   ?BMI 40.9 41.03   ?Systolic [id number]  ?Diastolic 74 71 97  ?Pulse 78 84 62  ? ?CONSTITUTIONAL: Age-appropriate female, hemodynamically stable, no acute distress.   ?SKIN: Skin is warm and dry. No rash noted. No cyanosis. No pallor. No jaundice ?HEAD: Normocephalic and atraumatic.  ?EYES: No scleral icterus ?MOUTH/THROAT: Moist oral membranes.  ?NECK: No JVD present. No thyromegaly noted.   ?CHEST Normal respiratory effort. No intercostal retractions  ?LUNGS: Clear to auscultation bilaterally.  No stridor. No wheezes. No rales.  ?CARDIOVASCULAR: Regular rate and rhythm, positive S1-S2, no murmurs rubs or gallops appreciated. ?ABDOMINAL: Obese, soft, nontender, nondistended, positive bowel sounds all 4 quadrants. No apparent ascites.  ?EXTREMITIES: No peripheral edema, warm to touch, 2+ bilateral DP and PT pulses ?HEMATOLOGIC: No significant bruising ?NEUROLOGIC: Oriented to person, place, and time. Nonfocal. Normal muscle tone.  ?PSYCHIATRIC: Normal mood and affect. Normal behavior. Cooperative ?No significant change in physical examination since last office visit. ? ?CARDIAC DATABASE: ?EKG: ?January 25, 2022: NSR, 77 bpm, without underlying ischemia or injury pattern.   ? ?Echocardiogram: ?02/01/2022: ?Normal LV systolic function with visual EF 60-65%. Left ventricle cavity is normal in size. Normal left ventricular wall thickness. Normal global wall motion. Normal diastolic filling pattern, normal LAP.  ?Left atrial cavity is normal in size. The interatrial septum is redundant but appears to be intact by 2D and CF Doppler interrogation. ?Trace aortic regurgitation. ?Mild (Grade I) mitral regurgitation. ?Mild tricuspid regurgitation. No evidence of pulmonary hypertension. ?No prior study for comparison.   ? ?Stress Testing: ?No results found for this or any previous visit from the past 1095 days. ? ? ?Heart Catheterization: ?None ? ?Carotid artery duplex 02/01/2022: ?No hemodynamically significant arterial disease in the internal carotid artery bilaterally. ?Antegrade right vertebral artery flow. Antegrade left vertebral artery flow. ? ?LABORATORY DATA: ? ?  Latest Ref Rng & Units 11/30/2021  ?  7:32 PM 11/13/2021  ? 12:59 AM 11/21/2020  ? 12:31 AM  ?CBC  ?WBC 4.0 - 10.5 K/uL 6.2   5.2   5.6    ?Hemoglobin 12.0 - 15.0 g/dL 14.8   14.4   13.9    ?Hematocrit 36.0 - 46.0 % 44.1   43.1   41.0    ?Platelets 150 - 400 K/uL 296   270   301    ? ? ? ?  Latest Ref Rng & Units 01/25/2022  ?  2:41 PM 11/30/2021  ?  7:32 PM 11/13/2021  ? 12:59 AM  ?CMP  ?Glucose 70 - 99 mg/dL 127   125   128    ?BUN 6 - 24 mg/dL 10   10   7     ?Creatinine 0.57 - 1.00 mg/dL 0.89   0.84   0.62    ?Sodium 134 - 144 mmol/L 141   140   139    ?  Potassium 3.5 - 5.2 mmol/L 4.4   3.8   3.5    ?Chloride 96 - 106 mmol/L 104   106   106    ?CO2 20 - 29 mmol/L 26   26   30     ?Calcium 8.7 - 10.2 mg/dL 9.7   8.9   8.8    ?Total Protein 6.0 - 8.5 g/dL 8.0   7.2   7.7    ?Total Bilirubin 0.0 - 1.2 mg/dL 0.3   0.3   0.6    ?Alkaline Phos 44 - 121 IU/L 110   78   82    ?AST 0 - 40 IU/L 17   20   18     ?ALT 0 - 32 IU/L 15   17   16     ? ? ?Lipid Panel  ?   ?Component Value Date/Time  ? CHOL 201 (H) 01/25/2022 1441  ? TRIG 129 01/25/2022 1441  ? HDL 42 01/25/2022 1441  ? LDLCALC 136 (H) 01/25/2022 1441  ? LDLDIRECT 127 (H) 01/25/2022 1441  ? LABVLDL 23 01/25/2022 1441  ? ? ?No components found for: NTPROBNP ?No results for input(s): PROBNP in the last 8760 hours. ?Recent Labs  ?  01/25/22 ?1441  ?TSH 1.590  ? ? ?BMP ?Recent Labs  ?  11/13/21 [id number] 11/30/21 ?1932 01/25/22 ?1441  ?NA 139 140 141  ?K 3.5 3.8 4.4  ?CL 106 106 104  ?CO2 30 26 26   ?GLUCOSE 128* 125* 127*  ?BUN 7 10 10   ?CREATININE 0.62 0.84 0.89  ?CALCIUM 8.8* 8.9 9.7  ?GFRNONAA >60 >60  --   ? ? ?HEMOGLOBIN  A1C ?Lab Results  ?Component Value Date  ? HGBA1C 6.3 (H) 10/07/2015  ? ? ?IMPRESSION: ? ?  ICD-10-CM   ?1. Near syncope  R55   ?  ?2. Benign hypertension  I10   ?  ?3. Pseudotumor cerebri  G93.2   ?  ?4. A

## 2022-02-28 ENCOUNTER — Telehealth: Payer: Self-pay | Admitting: Neurology

## 2022-02-28 NOTE — Telephone Encounter (Signed)
I called daughter of pt and relayed that was returning call.  Held [phone] with MM, moved up appt from [place] 03-23-2022 ?

## 2022-02-28 NOTE — Telephone Encounter (Signed)
Pt's daughter has called to report that Symptoms have returned along with pt feeling like she is choking.  Daughter states pt does not want to go to ER again because last time they didn't know how to help her.  Pt is expected to fly to [place] next week, daughter wants to know if pt should  go. ?

## 2022-02-28 NOTE — Telephone Encounter (Signed)
Pt daughter returned the call, would like a call back.  ?

## 2022-02-28 NOTE — Telephone Encounter (Signed)
I called daughter of pt.  Pt having similar sx of what she presented before.  Last seen 11/2021, had appt 03-23-2022 f/u, moved up appt due to pt to go fly back to [place].  R sided neck / side paresthesia (carwling sesnation) some choking sensation, when bends down feels dizzy.  Asking if ok to fly.  Dr Jaynee Eagles out of the office, moved up appt to tomorrow with MM/NP.  ?

## 2022-03-01 ENCOUNTER — Telehealth: Payer: Self-pay | Admitting: *Deleted

## 2022-03-01 ENCOUNTER — Encounter: Payer: Self-pay | Admitting: Adult Health

## 2022-03-01 ENCOUNTER — Ambulatory Visit: Payer: Medicaid Other | Admitting: Adult Health

## 2022-03-01 VITALS — Ht 68.0 in | Wt 263.6 lb

## 2022-03-01 DIAGNOSIS — G932 Benign intracranial hypertension: Secondary | ICD-10-CM | POA: Diagnosis not present

## 2022-03-01 DIAGNOSIS — M542 Cervicalgia: Secondary | ICD-10-CM | POA: Diagnosis not present

## 2022-03-01 NOTE — Telephone Encounter (Signed)
I called daughter, Conception Oms.  MM/NP reviewed chart and wanted to let daughter know that per testing that has been done on her mother already has been ok, have not been able to find anything to diagnose what is going on with her.  MM/NP is happy to evaluate/examine her today, but would not be able to give clearance re: flying.  She was ok to keep appt today.  ?

## 2022-03-01 NOTE — Progress Notes (Signed)
? ? ?PATIENT: Janice Hanson ?DOB: 25-Feb-1966 ? ?REASON FOR VISIT: follow up ?HISTORY FROM: patient ?PRIMARY NEUROLOGIST: Dr Jaynee Eagles ? ?Chief Complaint  ?Patient presents with  ? Follow-up  ?  Pt in 12 pt with daughter and interpreter. Pt states she is dizzy, pain in neck ,choking when eating ,headaches,and her legs feel like needles are sticking her    ? ? ? ?HISTORY OF PRESENT ILLNESS: ?Today 03/01/22: ? ?Janice Hanson is a 56 year old Arabic female here today with her daughter and interpreter.  In the past she has been seen for migraine headaches.  Today she reports multiple symptoms including dizziness, pain in the neck, choking when eating and pins and needle sensation in the upper legs.  Patient states the most concerning symptom is the pain in her neck.  She states the pain has been there prior to having her lumbar puncture.  Primarily on the left side of the neck.  Pain radiates down to mid bicep.  Denies any significant weakness. ? ?Patient has had recent work-up for pseudotumor cerebri.  Had a lumbar puncture but had to get a blood patch.  Recently saw her ophthalmologist Dr. Katy Fitch papilledema was not present. ? ?Patient has been evaluated by cardiology for ongoing dizziness.  Amlodipine was reduced and the patient reported that her symptoms improved but did not resolve. ? ?REVIEW OF SYSTEMS: Out of a complete 14 system review of symptoms, the patient complains only of the following symptoms, and all other reviewed systems are negative. ? ?ALLERGIES: ?No Known Allergies ? ?HOME MEDICATIONS: ?Outpatient Medications Prior to Visit  ?Medication Sig Dispense Refill  ? acetaminophen (TYLENOL) 500 MG tablet Take 500 mg by mouth every 6 (six) hours as needed for moderate pain or headache.    ? Cyanocobalamin (B-12 PO) Take 1 tablet by mouth daily.    ? omeprazole (PRILOSEC) 40 MG capsule Take 40 mg by mouth daily.    ? ?No facility-administered medications prior to visit.  ? ? ?PAST MEDICAL HISTORY: ?Past Medical  History:  ?Diagnosis Date  ? Abnormal finding in CSF   ? Arthritis   ? 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 can see shadows w/ left eye  ? GERD (gastroesophageal reflux disease)   ? Helicobacter pylori gastritis 11/20/2014  ? Hyperlipidemia   ? Hypertension   ? Hypothyroidism   ? pt denies  ? Kidney stones   ? Low blood pressure   ? Middle ear infection   ? Obesity   ? Pseudomembranous colitis   ? hx of  ? Pseudotumor cerebri   ? followed by Dr. Jaynee Eagles @ [facility]  ? Right knee meniscal tear   ? w/ lateral plateau stress fx  ? Spinal puncture headache 12/10/2021  ? Syncope 10/31/2015  ? ? ?PAST SURGICAL HISTORY: ?Past Surgical History:  ?Procedure Laterality Date  ? KNEE ARTHROSCOPY Right 10/10/2016  ? Procedure: ARTHROSCOPY KNEE WITH PARTIAL MEDIAL MENISECTOMY AND LATERAL SUBCHONDROPLASTY;  Surgeon: Nicholes Stairs, MD;  Location: [facility];  Service: Orthopedics;  Laterality: Right;  ? TONSILLECTOMY    ? ? ?FAMILY HISTORY: ?Family History  ?Problem Relation Age of Onset  ? Stomach cancer Mother   ? Diabetes Father   ? Hypertension Father   ? High blood pressure Brother   ? Diabetes Brother   ? Kidney disease Brother   ? Kidney Stones Maternal Uncle   ? Diabetes Maternal Grandmother   ? Heart disease Maternal Grandmother   ? Heart disease Paternal Grandfather   ?  Colon cancer Neg Hx   ? Pseudotumor cerebri Neg Hx   ? ? ?SOCIAL HISTORY: ?Social History  ? ?Socioeconomic History  ? Marital status: Married  ?  Spouse name: Not on file  ? Number of children: 6  ? Years of education: Not on file  ? Highest education level: Not on file  ?Occupational History  ? Occupation: na  ?Tobacco Use  ? Smoking status: Never  ? Smokeless tobacco: Never  ?Vaping Use  ? Vaping Use: Never used  ?Substance and Sexual Activity  ? Alcohol use: No  ?  Alcohol/week: 0.0 standard drinks  ? Drug use: No  ? Sexual activity: Not on file  ?Other Topics Concern  ? Not on file  ?Social History Narrative  ? Married  originally from [place]  ? 4 children 2 girls/2 boys  ? 2 children have passed  ? Caffeine use: 1-2 cups tea daily  ? Lives with husband and daughter.  ? Right-handed.  ? ?Social Determinants of Health  ? ?Financial Resource Strain: Not on file  ?Food Insecurity: Not on file  ?Transportation Needs: Not on file  ?Physical Activity: Not on file  ?Stress: Not on file  ?Social Connections: Not on file  ?Intimate Partner Violence: Not on file  ? ? ? ? ?PHYSICAL EXAM ? ?Vitals:  ? 03/01/22 1351 03/01/22 1355  ?Weight: 263 lb 9.6 oz (119.6 kg) 263 lb 9.6 oz (119.6 kg)  ?Height: 5\' 8"  (1.727 m) 5\' 8"  (1.727 m)  ? ?Body mass index is 40.08 kg/m?. ? ?Generalized: Well developed, in no acute distress  ? ?Neurological examination  ?Mentation: Alert oriented to time, place, history taking. Follows all commands speech and language fluent ?Cranial nerve II-XII: Pupils were equal round reactive to light. Extraocular movements were full, visual field were full on confrontational test. Facial sensation and strength were normal. Uvula tongue midline. Head turning and shoulder shrug  were normal and symmetric. ?Motor: The motor testing reveals 5 over 5 strength of all 4 extremities.  Mild giveaway weakness noted in the left upper extremity.  Reports soreness to palpation of the left trapezius muscle ?Sensory: Sensory testing is intact to soft touch on all 4 extremities. No evidence of extinction is noted.  ?Coordination: Cerebellar testing reveals good finger-nose-finger and heel-to-shin bilaterally.  ?Gait and station: Gait is normal.  ?Reflexes: Deep tendon reflexes are symmetric and normal bilaterally.  ? ?DIAGNOSTIC DATA (LABS, IMAGING, TESTING) ?- I reviewed patient records, labs, notes, testing and imaging myself where available. ? ?Lab Results  ?Component Value Date  ? WBC 6.2 11/30/2021  ? HGB 14.8 11/30/2021  ? HCT 44.1 11/30/2021  ? MCV 84.2 11/30/2021  ? PLT 296 11/30/2021  ? ?   ?Component Value Date/Time  ? NA 141 01/25/2022  1441  ? K 4.4 01/25/2022 1441  ? CL 104 01/25/2022 1441  ? CO2 26 01/25/2022 1441  ? GLUCOSE 127 (H) 01/25/2022 1441  ? GLUCOSE 125 (H) 11/30/2021 1932  ? BUN 10 01/25/2022 1441  ? CREATININE 0.89 01/25/2022 1441  ? CALCIUM 9.7 01/25/2022 1441  ? PROT 8.0 01/25/2022 1441  ? ALBUMIN 4.0 01/25/2022 1441  ? AST 17 01/25/2022 1441  ? ALT 15 01/25/2022 1441  ? ALKPHOS 110 01/25/2022 1441  ? BILITOT 0.3 01/25/2022 1441  ? GFRNONAA >60 11/30/2021 1932  ? GFRAA >60 07/05/2019 2047  ? ?Lab Results  ?Component Value Date  ? CHOL 201 (H) 01/25/2022  ? HDL 42 01/25/2022  ? LDLCALC 136 (H) 01/25/2022  ?  LDLDIRECT 127 (H) 01/25/2022  ? TRIG 129 01/25/2022  ? ?Lab Results  ?Component Value Date  ? HGBA1C 6.3 (H) 10/07/2015  ? ?Lab Results  ?Component Value Date  ? [id number] 11/04/2015  ? ?Lab Results  ?Component Value Date  ? TSH 1.590 01/25/2022  ? ? ? ? ?ASSESSMENT AND PLAN ?56 y.o. year old female  has a past medical history of Abnormal finding in CSF, Arthritis, Blindness - both eyes (12/1992), GERD (gastroesophageal reflux disease), Helicobacter pylori gastritis (11/20/2014), Hyperlipidemia, Hypertension, Hypothyroidism, Kidney stones, Low blood pressure, Middle ear infection, Obesity, Pseudomembranous colitis, Pseudotumor cerebri, Right knee meniscal tear, Spinal puncture headache (12/10/2021), and Syncope (10/31/2015). here with: ? ?1.  Left-sided neck pain ?2.  History of pseudotumor cerebri ? ?Patient comes in today with complaints of multiple symptoms.  Advised the patient that I could not address every symptom today so her most concerning symptom was her neck pain.  Exam was relatively unremarkable.  Advised patient that we could do physical therapy however they wanted to proceed with imaging.  Advised that I could order an MRI of the cervical spine without contrast however the patient is leaving Friday to go to [place].  I will not be able to get the scan approved and scheduled before then.  The patient is okay with  waiting until she returns.  Currently does not know when she will be back.  Advised that if her symptoms worsen or she develops new symptoms she should go to urgent care or the ED.  Follow-up on an as-needed basi

## 2022-03-23 ENCOUNTER — Ambulatory Visit: Payer: Medicaid Other | Admitting: Family Medicine

## 2022-12-04 ENCOUNTER — Observation Stay (HOSPITAL_COMMUNITY)
Admission: EM | Admit: 2022-12-04 | Discharge: 2022-12-07 | Disposition: A | Discharge status: Home / Self Care | Payer: BC Managed Care – PPO | Attending: Internal Medicine | Admitting: Internal Medicine

## 2022-12-04 ENCOUNTER — Emergency Department (HOSPITAL_COMMUNITY): Payer: BC Managed Care – PPO

## 2022-12-04 ENCOUNTER — Encounter (HOSPITAL_COMMUNITY): Payer: Self-pay | Admitting: Emergency Medicine

## 2022-12-04 ENCOUNTER — Other Ambulatory Visit: Payer: Self-pay

## 2022-12-04 DIAGNOSIS — G4489 Other headache syndrome: Secondary | ICD-10-CM

## 2022-12-04 DIAGNOSIS — G970 Cerebrospinal fluid leak from spinal puncture: Secondary | ICD-10-CM | POA: Diagnosis not present

## 2022-12-04 DIAGNOSIS — R519 Headache, unspecified: Secondary | ICD-10-CM | POA: Diagnosis present

## 2022-12-04 DIAGNOSIS — Z79899 Other long term (current) drug therapy: Secondary | ICD-10-CM | POA: Insufficient documentation

## 2022-12-04 DIAGNOSIS — E785 Hyperlipidemia, unspecified: Secondary | ICD-10-CM | POA: Diagnosis present

## 2022-12-04 DIAGNOSIS — E039 Hypothyroidism, unspecified: Secondary | ICD-10-CM | POA: Diagnosis not present

## 2022-12-04 DIAGNOSIS — I10 Essential (primary) hypertension: Secondary | ICD-10-CM | POA: Diagnosis present

## 2022-12-04 DIAGNOSIS — G932 Benign intracranial hypertension: Secondary | ICD-10-CM | POA: Diagnosis not present

## 2022-12-04 LAB — CBC WITH DIFFERENTIAL/PLATELET
Abs Immature Granulocytes: 0.01 10*3/uL (ref 0.00–0.07)
Basophils Absolute: 0 10*3/uL (ref 0.0–0.1)
Basophils Relative: 1 %
Eosinophils Absolute: 0.1 10*3/uL (ref 0.0–0.5)
Eosinophils Relative: 2 %
HCT: 43.5 % (ref 36.0–46.0)
Hemoglobin: 14 g/dL (ref 12.0–15.0)
Immature Granulocytes: 0 %
Lymphocytes Relative: 44 %
Lymphs Abs: 2.6 10*3/uL (ref 0.7–4.0)
MCH: 27.2 pg (ref 26.0–34.0)
MCHC: 32.2 g/dL (ref 30.0–36.0)
MCV: 84.6 fL (ref 80.0–100.0)
Monocytes Absolute: 0.7 10*3/uL (ref 0.1–1.0)
Monocytes Relative: 11 %
Neutro Abs: 2.5 10*3/uL (ref 1.7–7.7)
Neutrophils Relative %: 42 %
Platelets: 291 10*3/uL (ref 150–400)
RBC: 5.14 MIL/uL — ABNORMAL HIGH (ref 3.87–5.11)
RDW: 13.3 % (ref 11.5–15.5)
WBC: 5.9 10*3/uL (ref 4.0–10.5)
nRBC: 0 % (ref 0.0–0.2)

## 2022-12-04 LAB — COMPREHENSIVE METABOLIC PANEL
ALT: 18 U/L (ref 0–44)
AST: 19 U/L (ref 15–41)
Albumin: 3 g/dL — ABNORMAL LOW (ref 3.5–5.0)
Alkaline Phosphatase: 77 U/L (ref 38–126)
Anion gap: 7 (ref 5–15)
BUN: 8 mg/dL (ref 6–20)
CO2: 27 mmol/L (ref 22–32)
Calcium: 8.7 mg/dL — ABNORMAL LOW (ref 8.9–10.3)
Chloride: 106 mmol/L (ref 98–111)
Creatinine, Ser: 0.61 mg/dL (ref 0.44–1.00)
GFR, Estimated: >60 mL/min (ref >60)
Glucose, Bld: 100 mg/dL — ABNORMAL HIGH (ref 70–99)
Potassium: 4 mmol/L (ref 3.5–5.1)
Sodium: 140 mmol/L (ref 135–145)
Total Bilirubin: 0.7 mg/dL (ref 0.3–1.2)
Total Protein: 7 g/dL (ref 6.5–8.1)

## 2022-12-04 MED ORDER — LIDOCAINE HCL 2 % IJ SOLN
10.0000 mL | Freq: Once | INTRAMUSCULAR | Status: AC
Start: 2022-12-04 — End: 2022-12-04
  Administered 2022-12-04: 200 mg
  Filled 2022-12-04: qty 20

## 2022-12-04 MED ORDER — METOCLOPRAMIDE HCL 5 MG/ML IJ SOLN
10.0000 mg | Freq: Once | INTRAMUSCULAR | Status: AC
  Administered 2022-12-04: 10 mg via INTRAVENOUS
  Filled 2022-12-04: qty 2

## 2022-12-04 MED ORDER — LIDOCAINE HCL 2 % IJ SOLN: 10.0000 mL | Freq: Once | INTRAMUSCULAR | Status: DC

## 2022-12-04 MED ORDER — MORPHINE SULFATE (PF) 4 MG/ML IV SOLN
4.0000 mg | Freq: Once | INTRAVENOUS | Status: DC
  Filled 2022-12-04: qty 1

## 2022-12-04 MED ORDER — DIPHENHYDRAMINE HCL 50 MG/ML IJ SOLN
25.0000 mg | Freq: Once | INTRAMUSCULAR | Status: AC
  Administered 2022-12-04: 25 mg via INTRAVENOUS
  Filled 2022-12-04: qty 1

## 2022-12-04 NOTE — ED Triage Notes (Addendum)
Pt here with c/o dizziness and headache , pt just got off a 10 hr flight from [place] , long hx of headaches and spinal fluid issues , pt also is legally blind

## 2022-12-04 NOTE — ED Provider Notes (Addendum)
[facility] Provider Note   CSN: [id number] Arrival date & time: 12/04/22  1726     History  Chief Complaint  Patient presents with   Dizziness    Janice Hanson is a 57 y.o. female history of pseudotumor cerebri, post LP headache, here presenting with dizziness.  Patient states that she just flew back from [place] today.  She has been having severe headaches and dizziness.  Patient states that late December, she actually was in [place] and had lumbar puncture performed and they took out 30 cc of spinal fluid which improved her symptoms.  Patient is legally blind at baseline and did not have any worsening blurry vision.  She just came back from the airport and daughter brought her straight here for evaluation.  Patient is seen by [facility] neurology.  The history is provided by the patient.       Home Medications Prior to Admission medications   Medication Sig Start Date End Date Taking? Authorizing Provider  acetaminophen (TYLENOL) 500 MG tablet Take 500 mg by mouth every 6 (six) hours as needed for moderate pain or headache.    [provider]  Cyanocobalamin (B-12 PO) Take 1 tablet by mouth daily.    [provider]  omeprazole (PRILOSEC) 40 MG capsule Take 40 mg by mouth daily.    [provider]      Allergies    Patient has no known allergies.    Review of Systems   Review of Systems  Neurological:  Positive for dizziness.  All other systems reviewed and are negative.   Physical Exam Updated Vital Signs BP 133/70   Pulse 67   Temp 98.1 F (36.7 C)   Resp 16   SpO2 100%  Physical Exam Vitals and nursing note reviewed.  Constitutional:      Comments: Chronically ill-appearing  HENT:     Nose: Nose normal.     Mouth/Throat:     Mouth: Mucous membranes are moist.  Eyes:     Extraocular Movements: Extraocular movements intact.     Pupils: Pupils are equal, round, and reactive to light.   Cardiovascular:     Rate and Rhythm: Normal rate and regular rhythm.     Pulses: Normal pulses.     Heart sounds: Normal heart sounds.  Pulmonary:     Effort: Pulmonary effort is normal.     Breath sounds: Normal breath sounds.  Abdominal:     General: Abdomen is flat.     Palpations: Abdomen is soft.  Musculoskeletal:        General: Normal range of motion.     Cervical back: Normal range of motion and neck supple.  Skin:    General: Skin is warm.     Capillary Refill: Capillary refill takes less than 2 seconds.  Neurological:     General: No focal deficit present.     Mental Status: She is alert and oriented to person, place, and time.  Psychiatric:        Mood and Affect: Mood normal.        Behavior: Behavior normal.     ED Results / Procedures / Treatments   Labs (all labs ordered are listed, but only abnormal results are displayed) Labs Reviewed  CBC WITH DIFFERENTIAL/PLATELET - Abnormal; Notable for the following components:      Result Value   RBC 5.14 (*)    All other components within normal limits  COMPREHENSIVE METABOLIC PANEL -  Abnormal; Notable for the following components:   Glucose, Bld 100 (*)    Calcium 8.7 (*)    Albumin 3.0 (*)    All other components within normal limits    EKG None  Radiology No results found.  Procedures .Lumbar Puncture  Date/Time: 12/04/2022 11:35 PM  Performed by: Drenda Freeze, MD Authorized by: Drenda Freeze, MD   Consent:    Consent obtained:  Verbal   Consent given by:  Patient   Risks, benefits, and alternatives were discussed: yes     Risks discussed:  Bleeding and infection   Alternatives discussed:  No treatment Universal protocol:    Procedure explained and questions answered to patient or proxy's satisfaction: yes     Relevant documents present and verified: yes     Patient identity confirmed:  Verbally with patient Pre-procedure details:    Procedure purpose:  Diagnostic   Preparation:  Patient was prepped and draped in usual sterile fashion   Anesthesia:    Anesthesia method:  Local infiltration Procedure details:    Lumbar space:  L4-L5 interspace   Patient position:  L lateral decubitus   Needle gauge:  18   Needle type:  Diamond point   Needle length (in):  5.0   Ultrasound guidance: no     Number of attempts:  2 Post-procedure details:    Puncture site:  Adhesive bandage applied       Medications Ordered in ED Medications  lidocaine (XYLOCAINE) 2 % (with pres) injection 200 mg (0 mg Infiltration Hold 12/04/22 1939)  morphine (PF) 4 MG/ML injection 4 mg (0 mg Intravenous Hold 12/04/22 2121)  metoCLOPramide (REGLAN) injection 10 mg (10 mg Intravenous Given 12/04/22 1941)  diphenhydrAMINE (BENADRYL) injection 25 mg (25 mg Intravenous Given 12/04/22 1941)    ED Course/ Medical Decision Making/ A&P                             Medical Decision Making Janice Hanson is a 57 y.o. female history of pseudotumor cerebri, legally blind here presenting with headaches and dizziness.  Patient has been having headaches ongoing for months to years.  Patient had lumbar puncture done 2 months ago in [place] and apparently 30 cc were taken out.  Family felt that she likely has pseudotumor cerebri and elevated intracranial pressure again.  She also had a blood patch previously from CSF leak.  Daughter felt that she needs an LP to assess for elevated intracranial pressure.  Will attempt to do an LP.  9 pm I attempted to do an LP.  I tried to get into the L4-5 space.  I had the ED resident assist me. However there is no CSF fluid that came out.  I consulted Dr. Curly Shores from neurology regarding the patient.  She states that she will see the patient as a consult.    11:36 PM Dr. Curly Shores saw the patient and recommended blood patch.  I initially ordered fluoroscopy guided LP but canceled it.  I suspect that she has low CSF pressure. She also ordered MRI and MRV. Signed out to Dr. Betsey Holiday to  admit patient. Likely need blood patch.    Problems Addressed: Nonintractable headache, unspecified chronicity pattern, unspecified headache type: acute illness or injury  Amount and/or Complexity of Data Reviewed Labs: ordered. Decision-making details documented in ED Course. Radiology: ordered and independent interpretation performed. Decision-making details documented in ED Course.  Risk Prescription drug management.  Final Clinical Impression(s) / ED Diagnoses Final diagnoses:  None    Rx / DC Orders ED Discharge Orders     None         Drenda Freeze, MD 12/04/22 2338    Drenda Freeze, MD 12/04/22 [phone]

## 2022-12-04 NOTE — Consult Note (Signed)
Neurology Consultation Reason for Consult: Headache  Requesting Physician: Shirlyn Goltz  CC: Headache   History is obtained from: Patient, daughter, husband at bedside, chart review, daughter interpreting per strong family preference, declined video interpreter  HPI: Janice Hanson is a 57 y.o. 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  By family report opening pressure was 30 (to ED provider it was reported they drained 30 cc), she had dizziness, vomiting, neck and shoulder pain.  She reported she felt better only for 2 days and then had worsening symptoms.  She took Diamox for 5 days is unsure of the dose but discontinued it due to tachycardia and dizziness and facial numbness.  Subsequently in the last 10 days particularly she has been having worsening headache, with some subjective chills (has not taken her temperature), feels like blood pressure has been increased, complaining of some "liquid in her eyes" (slight tearing), slightly runny nose (clear fluid), episodic choking sensation, left-sided neck stiffness, right ear fullness, right ear tinnitus, and some intermittent chest and left arm soreness/numbness.  Regarding her headache, this is severely exacerbated by sitting up and improves with laying down.   Regarding her headache history, she was diagnosed with IIH in 1994 which unfortunately did progressed to blindness.  She discontinued the Diamox on her own in 2002 due to side effects, but has been getting surveillance lumbar punctures intermittently.  On outpatient records with [facility] since 2017 no objective evidence of IIH although family has remained concerned  about this as a underlying diagnosis.  She has also been treated for migraine and occipital neuralgia.  In December 2022 she had a lumbar puncture with opening pressure of 24 which was followed by a low pressure headache which required blood patch, which did improve her symptoms.  Regarding her course in the ED, husband was very concerned about her spinal fluid level and LP was attempted by Dr. Darl Householder at multiple levels without success; I discussed with family that this is often due to low CSF pressure preventing good return of fluid.  Her headache here remains strongly positional improving with laying down and worsening with sitting up  ROS: All other review of systems was negative except as noted in the HPI.   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 Jaynee Eagles @ [facility]   Right knee meniscal tear    w/ lateral plateau stress fx   Spinal puncture headache 12/10/2021   Syncope 10/31/2015   Past Surgical History:  Procedure Laterality Date   KNEE ARTHROSCOPY Right 10/10/2016   Procedure: ARTHROSCOPY KNEE WITH PARTIAL MEDIAL MENISECTOMY AND LATERAL SUBCHONDROPLASTY;  Surgeon: Nicholes Stairs, MD;  Location: [facility];  Service: Orthopedics;  Laterality: Right;   TONSILLECTOMY     Current Outpatient Medications  Medication Instructions   acetaminophen (TYLENOL) 500 mg, Oral, Every 6 hours PRN   Cyanocobalamin (B-12 PO) 1 tablet, Oral, Daily   omeprazole (PRILOSEC) 40 mg, Oral, Daily     Family History  Problem Relation Age of Onset   Stomach cancer Mother     Diabetes Father    Hypertension Father    High blood pressure Brother    Diabetes Brother    Kidney disease Brother    Kidney Stones Maternal Uncle    Diabetes Maternal Grandmother    Heart disease Maternal Grandmother    Heart disease Paternal Grandfather    Colon cancer Neg Hx    Pseudotumor cerebri Neg Hx     Social History:  reports that she has never smoked. She has never used smokeless tobacco. She reports that she does not drink alcohol and does not use drugs.   Exam: Current vital signs: BP 124/67   Pulse 71   Temp 98.4 F (36.9 C) (Oral)   Resp 18   SpO2 100%  Vital signs in last 24 hours: Temp:  [98.1 F (36.7 C)-98.4 F (36.9 C)] 98.4 F (36.9 C) (02/11 2315) Pulse Rate:  [64-78] 71 (02/11 2315) Resp:  [15-18] 18 (02/11 2315) BP: (124-139)/(67-85) 124/67 (02/11 2315) SpO2:  [95 %-100 %] 100 % (02/11 2315)   Physical Exam  Constitutional: Appears well-developed and well-nourished.  Psych: Affect appropriate to situation, pleasant and cooperative Eyes: No scleral injection HENT: No oropharyngeal obstruction.  Normal tympanic membranes bilaterally.  No significant rhinorrhea on my eval MSK: no joint deformities.  Cardiovascular: Normal rate and regular rhythm. Perfusing extremities well Respiratory: Effort normal, non-labored breathing GI: Soft.  No distension. There is no tenderness.  Skin: Warm dry and intact visible skin  Neuro: Mental Status: Patient is awake, alert, oriented to person, place, month, year, and situation. Patient is able to give a clear and coherent history. No signs of aphasia or neglect Cranial Nerves: II: Pupils are round, sluggishly reactive but intact light perception bilaterally, reports vision is at her baseline.  Right eye less reactive than the left III,IV, VI: EOMI without ptosis or diploplia.  V: Facial sensation is symmetric to temperature VII: Facial movement is symmetric, subtle right facial droop at baseline symmetric  on activation, family reports this is baseline.  VIII: hearing is intact to voice X: Uvula elevates symmetrically XI: Shoulder shrug is symmetric. XII: tongue is midline without atrophy or fasciculations.  Motor: Tone is normal. Bulk is normal. 5/5 strength was present in all four extremities.  No pronator drift bilaterally Sensory: Sensation is symmetric to light touch and temperature in the arms and legs. Deep Tendon Reflexes: 1+ and symmetric in the brachioradialis and patellae.  Plantars: Toes are downgoing bilaterally.  Cerebellar: FNF and HKS are intact bilaterally Gait:  Deferred in acute setting   I have reviewed labs in epic and the results pertinent to this consultation are:  Basic Metabolic Panel: Recent Labs  Lab 12/04/22 1927  NA 140  K 4.0  CL 106  CO2 27  GLUCOSE 100*  BUN 8  CREATININE 0.61  CALCIUM 8.7*    CBC: Recent Labs  Lab 12/04/22 1927  WBC 5.9  NEUTROABS 2.5  HGB 14.0  HCT 43.5  MCV 84.6  PLT 291    Coagulation Studies: No results for input(s): "LABPROT", "INR" in the last 72 hours.    I have reviewed the images  obtained: No CNS imaging at this time   Impression: Symptoms at this time are consistent with CSF leak headache likely secondary to her recent lumbar puncture in [place].  Unfortunately these records are not available for review to confirm opening pressure etc. but given the very brief improvement in her symptoms, seems less likely to be consistent with IIH.  Additionally family asked multiple times about whether I felt she needs a CSF shunt.  We discussed that essentially at this time her leak is causing a CSF shunt and she is having low pressure headache, therefore I see no indication for evaluation for shunt.  Additionally we discussed that a blood patch would be needed to try to alleviate her symptoms, and that if she has continued headache after blood patch outpatient follow-up is appropriate  Recommendations: - Admit for  blood patch, will need to determine level of which lumbar puncture was performed in [place], and would additionally patch at levels attempted in ED today - MRI brain with and without contrast, MR venogram to exclude CVST which can arise as a complication from untreated CSF leak, and to exclude acute intracranial process - IVF as much as tolerated - Liberal PO caffiene intake as much as tolerated - Appreciate management of comorbidities per primary team - If she continues to have positional headache after blood patch worse with sitting up and better with laying down, needs referral to CSF clinic at [facility], as they have specific expertise in CSF leak disorders, unfortunately as this is a nonemergent procedure inpatient transfer is not a viable option - Close outpatient follow-up with Dr. Jaynee Eagles, in patient neurology will be available as needed.    Lesleigh Noe MD-PhD [facility] [phone] Available 7 PM to 7 AM, outside of these hours please call Neurologist on call as listed on Amion.   Greater than 80 minutes were spent in care of this patient today, the majority at bedside

## 2022-12-05 ENCOUNTER — Observation Stay (HOSPITAL_COMMUNITY): Payer: BC Managed Care – PPO

## 2022-12-05 ENCOUNTER — Other Ambulatory Visit: Payer: Self-pay

## 2022-12-05 DIAGNOSIS — G932 Benign intracranial hypertension: Secondary | ICD-10-CM

## 2022-12-05 DIAGNOSIS — I10 Essential (primary) hypertension: Secondary | ICD-10-CM | POA: Diagnosis present

## 2022-12-05 DIAGNOSIS — E785 Hyperlipidemia, unspecified: Secondary | ICD-10-CM | POA: Diagnosis present

## 2022-12-05 DIAGNOSIS — G970 Cerebrospinal fluid leak from spinal puncture: Secondary | ICD-10-CM | POA: Diagnosis not present

## 2022-12-05 HISTORY — PX: IR FLUORO GUIDED NEEDLE PLC ASPIRATION/INJECTION LOC: IMG2395

## 2022-12-05 LAB — BASIC METABOLIC PANEL
Anion gap: 7 (ref 5–15)
BUN: 6 mg/dL (ref 6–20)
CO2: 25 mmol/L (ref 22–32)
Calcium: 8.1 mg/dL — ABNORMAL LOW (ref 8.9–10.3)
Chloride: 109 mmol/L (ref 98–111)
Creatinine, Ser: 0.59 mg/dL (ref 0.44–1.00)
GFR, Estimated: >60 mL/min (ref >60)
Glucose, Bld: 108 mg/dL — ABNORMAL HIGH (ref 70–99)
Potassium: 3.8 mmol/L (ref 3.5–5.1)
Sodium: 141 mmol/L (ref 135–145)

## 2022-12-05 LAB — CBC
HCT: 38.7 % (ref 36.0–46.0)
Hemoglobin: 12.9 g/dL (ref 12.0–15.0)
MCH: 27.6 pg (ref 26.0–34.0)
MCHC: 33.3 g/dL (ref 30.0–36.0)
MCV: 82.9 fL (ref 80.0–100.0)
Platelets: 259 10*3/uL (ref 150–400)
RBC: 4.67 MIL/uL (ref 3.87–5.11)
RDW: 13.5 % (ref 11.5–15.5)
WBC: 5.2 10*3/uL (ref 4.0–10.5)
nRBC: 0 % (ref 0.0–0.2)

## 2022-12-05 LAB — HIV ANTIBODY (ROUTINE TESTING W REFLEX): HIV Screen 4th Generation wRfx: NONREACTIVE

## 2022-12-05 LAB — MAGNESIUM: Magnesium: 1.8 mg/dL (ref 1.7–2.4)

## 2022-12-05 MED ORDER — GADOBUTROL 1 MMOL/ML IV SOLN
10.0000 mL | Freq: Once | INTRAVENOUS | Status: AC | PRN
  Administered 2022-12-05: 10 mL via INTRAVENOUS

## 2022-12-05 MED ORDER — HYDROMORPHONE HCL 1 MG/ML IJ SOLN: 0.5000 mg | INTRAMUSCULAR | Status: DC | PRN

## 2022-12-05 MED ORDER — SODIUM CHLORIDE 0.9% FLUSH
3.0000 mL | Freq: Two times a day (BID) | INTRAVENOUS | Status: DC
  Administered 2022-12-05 – 2022-12-07 (×5): 3 mL via INTRAVENOUS

## 2022-12-05 MED ORDER — LIDOCAINE HCL (PF) 1 % IJ SOLN
INTRAMUSCULAR | Status: AC
  Administered 2022-12-05: 8 mL
  Filled 2022-12-05: qty 30

## 2022-12-05 MED ORDER — BISACODYL 5 MG PO TBEC: 5.0000 mg | DELAYED_RELEASE_TABLET | Freq: Every day | ORAL | Status: DC | PRN

## 2022-12-05 MED ORDER — IOHEXOL 180 MG/ML  SOLN
INTRAMUSCULAR | Status: AC
  Administered 2022-12-05: 2 mL
  Filled 2022-12-05: qty 10

## 2022-12-05 MED ORDER — POLYETHYLENE GLYCOL 3350 17 G PO PACK: 17.0000 g | PACK | Freq: Every day | ORAL | Status: DC | PRN

## 2022-12-05 MED ORDER — ONDANSETRON HCL 4 MG/2ML IJ SOLN
4.0000 mg | Freq: Four times a day (QID) | INTRAMUSCULAR | Status: DC | PRN
  Administered 2022-12-06: 4 mg via INTRAVENOUS
  Filled 2022-12-05: qty 2

## 2022-12-05 MED ORDER — OXYCODONE HCL 5 MG PO TABS
5.0000 mg | ORAL_TABLET | ORAL | Status: DC | PRN
  Administered 2022-12-05 (×3): 5 mg via ORAL
  Filled 2022-12-05 (×3): qty 1

## 2022-12-05 MED ORDER — ACETAMINOPHEN 325 MG PO TABS
650.0000 mg | ORAL_TABLET | Freq: Four times a day (QID) | ORAL | Status: DC | PRN
  Administered 2022-12-06: 650 mg via ORAL
  Filled 2022-12-05: qty 2

## 2022-12-05 MED ORDER — SODIUM CHLORIDE 0.9 % IV BOLUS
1000.0000 mL | Freq: Once | INTRAVENOUS | Status: AC
  Administered 2022-12-05: 1000 mL via INTRAVENOUS

## 2022-12-05 MED ORDER — SODIUM CHLORIDE 0.9 % IV SOLN: INTRAVENOUS | Status: AC

## 2022-12-05 MED ORDER — ONDANSETRON HCL 4 MG PO TABS: 4.0000 mg | ORAL_TABLET | Freq: Four times a day (QID) | ORAL | Status: DC | PRN

## 2022-12-05 MED ORDER — ACETAMINOPHEN 650 MG RE SUPP: 650.0000 mg | Freq: Four times a day (QID) | RECTAL | Status: DC | PRN

## 2022-12-05 NOTE — H&P (Signed)
History and Physical    Uchenna Gourd [id number] DOB: [birth year] DOA: 12/04/2022  PCP: Elwyn Reach, MD   Patient coming from: Home   Chief Complaint: Headache  HPI: Rogers Messamore is a 57 y.o. 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  Patient was seen in [place] for headache on 10/12/2022 and underwent LP.  She experienced dizziness, vomiting, and pain in her neck and shoulder following the LP.  She had some improvement in her symptoms for a couple days before she began to worsen again.  She was taking Diamox but this was stopped due to tachycardia, dizziness, and facial numbness.  She now reports worsening headache over the past 10 days which has been much worse when sitting up and improves when lying down.   ED Course: Upon arrival to the ED, patient is found to be afebrile and saturating well on room air with normal heart rate and stable blood pressure.  CMP and CBC were largely unremarkable.  ED physician made multiple unsuccessful attempts at lumbar puncture.  Neurology was consulted by the ED physician and recommended medical admission, blood patch, MRI brain with and without contrast, MR venogram, liberal IV fluid hydration, and liberal oral caffeine.  Review of Systems:  All other systems reviewed and apart from HPI, are negative.  Past Medical History:  Diagnosis Date   Abnormal finding in CSF    Arthritis    Blindness - both eyes 12/1992   can see shadows w/ left eye   GERD (gastroesophageal reflux disease)    Helicobacter pylori gastritis 11/20/2014   Hyperlipidemia    Hypertension    Hypothyroidism    pt denies   Kidney stones    Low blood pressure    Middle ear infection    Obesity    Pseudomembranous colitis    hx of    Pseudotumor cerebri    followed by Dr. Jaynee Eagles @ [facility]   Right knee meniscal tear    w/ lateral plateau stress fx   Spinal puncture headache 12/10/2021   Syncope 10/31/2015    Past Surgical History:  Procedure Laterality Date   KNEE ARTHROSCOPY Right 10/10/2016   Procedure: ARTHROSCOPY KNEE WITH PARTIAL MEDIAL MENISECTOMY AND LATERAL SUBCHONDROPLASTY;  Surgeon: Nicholes Stairs, MD;  Location: [facility];  Service: Orthopedics;  Laterality: Right;   TONSILLECTOMY      Social History:   reports that she has never smoked. She has never used smokeless tobacco. She reports that she does not drink alcohol and does not use drugs.  No Known Allergies  Family History  Problem Relation Age of Onset   Stomach cancer Mother    Diabetes Father    Hypertension Father    High blood pressure Brother    Diabetes Brother    Kidney disease Brother    Kidney Stones Maternal Uncle    Diabetes Maternal Grandmother    Heart disease Maternal Grandmother    Heart disease Paternal Grandfather    Colon cancer Neg Hx    Pseudotumor cerebri Neg Hx      Prior to Admission medications   Medication Sig Start Date End Date Taking? Authorizing Provider  acetaminophen (TYLENOL) 500 MG tablet Take 500 mg by mouth every 6 (six) hours as needed for moderate pain or headache.    [provider]  Cyanocobalamin (B-12  PO) Take 1 tablet by mouth daily.    [provider]  omeprazole (PRILOSEC) 40 MG capsule Take 40 mg by mouth daily.    [provider]    Physical Exam: Vitals:   12/04/22 1740 12/04/22 1915 12/04/22 1945 12/04/22 2315  BP: 139/85 137/76 133/70 124/67  Pulse: 78 64 67 71  Resp: 15 18 16 18  $ Temp: 98.1 F (36.7 C)   98.4 F (36.9 C)  TempSrc:    Oral  SpO2: 95% 100% 100% 100%    Constitutional: NAD, no pallor or diaphoresis   Eyes: PERTLA, lids and conjunctivae normal ENMT: Mucous membranes are moist. Posterior pharynx clear  of any exudate or lesions.   Neck: supple, no masses  Respiratory: no wheezing, no crackles. No accessory muscle use.  Cardiovascular: S1 & S2 heard, regular rate and rhythm. No JVD.   Abdomen: No distension, no tenderness, soft. Bowel sounds active.  Musculoskeletal: no clubbing / cyanosis. No joint deformity upper and lower extremities.   Skin: no significant rashes, lesions, ulcers. Warm, dry, well-perfused. Neurologic: No gross facial asymmetry. Moving all extremities. Alert and oriented.  Psychiatric: Calm. Cooperative.    Labs and Imaging on Admission: I have personally reviewed following labs and imaging studies  CBC: Recent Labs  Lab 12/04/22 1927  WBC 5.9  NEUTROABS 2.5  HGB 14.0  HCT 43.5  MCV 84.6  PLT [id number]   Basic Metabolic Panel: Recent Labs  Lab 12/04/22 1927  NA 140  K 4.0  CL 106  CO2 27  GLUCOSE 100*  BUN 8  CREATININE 0.61  CALCIUM 8.7*   GFR: CrCl cannot be calculated (Unknown ideal weight.). Liver Function Tests: Recent Labs  Lab 12/04/22 1927  AST 19  ALT 18  ALKPHOS 77  BILITOT 0.7  PROT 7.0  ALBUMIN 3.0*   No results for input(s): "LIPASE", "AMYLASE" in the last 168 hours. No results for input(s): "AMMONIA" in the last 168 hours. Coagulation Profile: No results for input(s): "INR", "PROTIME" in the last 168 hours. Cardiac Enzymes: No results for input(s): "CKTOTAL", "CKMB", "CKMBINDEX", "TROPONINI" in the last 168 hours. BNP (last 3 results) No results for input(s): "PROBNP" in the last 8760 hours. HbA1C: No results for input(s): "HGBA1C" in the last 72 hours. CBG: No results for input(s): "GLUCAP" in the last 168 hours. Lipid Profile: No results for input(s): "CHOL", "HDL", "LDLCALC", "TRIG", "CHOLHDL", "LDLDIRECT" in the last 72 hours. Thyroid Function Tests: No results for input(s): "TSH", "T4TOTAL", "FREET4", "T3FREE", "THYROIDAB" in the last 72 hours. Anemia Panel: No results for input(s): "VITAMINB12", "FOLATE",  "FERRITIN", "TIBC", "IRON", "RETICCTPCT" in the last 72 hours. Urine analysis:    Component Value Date/Time   COLORURINE YELLOW 10/31/2020 [place] 10/31/2020 1716   LABSPEC 1.011 10/31/2020 1716   PHURINE 7.0 10/31/2020 1716   GLUCOSEU NEGATIVE 10/31/2020 1716   HGBUR NEGATIVE 10/31/2020 [place] 10/31/2020 [place] 10/31/2020 1716   PROTEINUR NEGATIVE 10/31/2020 1716   UROBILINOGEN 0.2 08/16/2014 0144   NITRITE NEGATIVE 10/31/2020 [place] 10/31/2020 1716   Sepsis Labs: @LABRCNTIP$ (procalcitonin:4,lacticidven:4) )No results found for this or any previous visit (from the past 240 hour(s)).   Radiological Exams on Admission: No results found.  Assessment/Plan   1. Headache; IIH - Appreciate neurology assessment and recommendations, suspecting CSF leak headache and recommending MRI brain with and without contrast, MR venogram, blood patch, IVF hydration, oral caffeine     2. Hypertension, hyperlipidemia,  pre-diabetes  - These have been managed with lifestyle interventions  - Treat BP and hyperglycemia as needed    DVT prophylaxis: SCDs  Code Status: Full  Level of Care: Level of care: Telemetry Medical Family Communication: Husband and daughter at bedside  Disposition Plan:  Patient is from: Home  Anticipated d/c is to: Home  Anticipated d/c date is: Possibly as early as 2/12 or 12/06/22  Patient currently: Pending MRI brain, MRV, consult for blood patch  Consults called: Neurology  Admission status: Observation     Vianne Bulls, MD [facility]  12/05/2022, 12:55 AM

## 2022-12-05 NOTE — Progress Notes (Signed)
  Transition of Care [facility]) Screening Note   Patient Details  Name: Temia Debroux Date of Birth: 16-Nov-1965   Transition of Care [facility]) CM/SW Contact:    Pollie Friar, RN Phone Number: 12/05/2022, 3:56 PM   Pt is from home with spouse. Transition of Care Department [facility]) has reviewed patient. We will continue to monitor patient advancement through interdisciplinary progression rounds. If new patient transition needs arise, please place a TOC consult.

## 2022-12-05 NOTE — Procedures (Signed)
Interventional Radiology Procedure Note  Procedure: Fluoroscopy guided lumbar epidural blood patch   Indication: CSF leak  Findings: Please refer to procedural dictation for full description.  Complications: None  EBL: < 10 mL  Miachel Roux, MD [phone]

## 2022-12-05 NOTE — ED Notes (Signed)
ED TO INPATIENT HANDOFF REPORT  ED Nurse Name and Phone #: [phone] [place] RN  S Name/Age/Gender Janice Hanson 57 y.o. female Room/Bed: 018C/018C  Code Status   Code Status: Prior  Home/SNF/Other Home Patient oriented to: self, place, time, and situation Is this baseline? Yes   Triage Complete: Triage complete  Chief Complaint Headache [R51.9]  Triage Note Pt here with c/o dizziness and headache , pt just got off a 10 hr flight from [place] , long hx of headaches and spinal fluid issues , pt also is legally blind    Allergies No Known Allergies  Level of Care/Admitting Diagnosis ED Disposition     ED Disposition  [place]: [place] [[id number]]  Level of Care: Telemetry Medical [104]  May place patient in observation at [facility] or [place] if equivalent level of care is available:: No  Covid Evaluation: Asymptomatic - no recent exposure (last 10 days) testing not required  Diagnosis: Headache [id number]  Admitting Physician: Vianne Bulls [id number]  Attending Physician: Vianne Bulls [id number]          B Medical/Surgery History Past Medical History:  Diagnosis Date   Abnormal finding in CSF    Arthritis    Blindness - both eyes 12/1992   can see shadows w/ left eye   GERD (gastroesophageal reflux disease)    Helicobacter pylori gastritis 11/20/2014   Hyperlipidemia    Hypertension    Hypothyroidism    pt denies   Kidney stones    Low blood pressure    Middle ear infection    Obesity    Pseudomembranous colitis    hx of   Pseudotumor cerebri    followed by Dr. Jaynee Eagles @ [facility]   Right knee meniscal tear    w/ lateral plateau stress fx   Spinal puncture headache 12/10/2021   Syncope 10/31/2015   Past Surgical History:  Procedure Laterality Date   KNEE ARTHROSCOPY Right 10/10/2016   Procedure: ARTHROSCOPY KNEE WITH PARTIAL MEDIAL MENISECTOMY AND LATERAL SUBCHONDROPLASTY;   Surgeon: Nicholes Stairs, MD;  Location: [facility];  Service: Orthopedics;  Laterality: Right;   TONSILLECTOMY       A IV Location/Drains/Wounds Patient Lines/Drains/Airways Status     Active Line/Drains/Airways     Name Placement date Placement time Site Days   Peripheral IV 12/04/22 22 G Right Antecubital 12/04/22  1939  Antecubital  1            Intake/Output Last 24 hours No intake or output data in the 24 hours ending 12/05/22 0048  Labs/Imaging Results for orders placed or performed during the hospital encounter of 12/04/22 (from the past 48 hour(s))  CBC with Differential     Status: Abnormal   Collection Time: 12/04/22  7:27 PM  Result Value Ref Range   WBC 5.9 4.0 - 10.5 K/uL   RBC 5.14 (H) 3.87 - 5.11 MIL/uL   Hemoglobin 14.0 12.0 - 15.0 g/dL   HCT 43.5 36.0 - 46.0 %   MCV 84.6 80.0 - 100.0 fL   MCH 27.2 26.0 - 34.0 pg   MCHC 32.2 30.0 - 36.0 g/dL   RDW 13.3 11.5 - 15.5 %   Platelets 291 150 - 400 K/uL   nRBC 0.0 0.0 - 0.2 %   Neutrophils Relative % 42 %   Neutro Abs 2.5 1.7 - 7.7 K/uL   Lymphocytes Relative 44 %  Lymphs Abs 2.6 0.7 - 4.0 K/uL   Monocytes Relative 11 %   Monocytes Absolute 0.7 0.1 - 1.0 K/uL   Eosinophils Relative 2 %   Eosinophils Absolute 0.1 0.0 - 0.5 K/uL   Basophils Relative 1 %   Basophils Absolute 0.0 0.0 - 0.1 K/uL   Immature Granulocytes 0 %   Abs Immature Granulocytes 0.01 0.00 - 0.07 K/uL    Comment: Performed at [facility], [place] [address] [place], [place] [address]  Comprehensive metabolic panel     Status: Abnormal   Collection Time: 12/04/22  7:27 PM  Result Value Ref Range   Sodium 140 135 - 145 mmol/L   Potassium 4.0 3.5 - 5.1 mmol/L   Chloride 106 98 - 111 mmol/L   CO2 27 22 - 32 mmol/L   Glucose, Bld 100 (H) 70 - 99 mg/dL    Comment: Glucose reference range applies only to samples taken after fasting for at least 8 hours.   BUN 8 6 - 20 mg/dL   Creatinine, Ser 0.61 0.44 - 1.00  mg/dL   Calcium 8.7 (L) 8.9 - 10.3 mg/dL   Total Protein 7.0 6.5 - 8.1 g/dL   Albumin 3.0 (L) 3.5 - 5.0 g/dL   AST 19 15 - 41 U/L   ALT 18 0 - 44 U/L   Alkaline Phosphatase 77 38 - 126 U/L   Total Bilirubin 0.7 0.3 - 1.2 mg/dL   GFR, Estimated >60 >60 mL/min    Comment: (NOTE) Calculated using the CKD-EPI Creatinine Equation (2021)    Anion gap 7 5 - 15    Comment: Performed at [facility], [place] [address] [place], [place] [address]   No results found.  Pending Labs Unresulted Labs (From admission, onward)    None       Vitals/Pain Today's Vitals   12/04/22 1748 12/04/22 1915 12/04/22 1945 12/04/22 2315  BP:  137/76 133/70 124/67  Pulse:  64 67 71  Resp:  18 16 18  $ Temp:    98.4 F (36.9 C)  TempSrc:    Oral  SpO2:  100% 100% 100%  PainSc: 8        Isolation Precautions No active isolations  Medications Medications  morphine (PF) 4 MG/ML injection 4 mg (0 mg Intravenous Hold 12/04/22 2121)  lidocaine (XYLOCAINE) 2 % (with pres) injection 200 mg (0 mg Infiltration Hold 12/04/22 2303)  metoCLOPramide (REGLAN) injection 10 mg (10 mg Intravenous Given 12/04/22 1941)  diphenhydrAMINE (BENADRYL) injection 25 mg (25 mg Intravenous Given 12/04/22 1941)  lidocaine (XYLOCAINE) 2 % (with pres) injection 200 mg (200 mg Infiltration Given by Other 12/04/22 2015)    Mobility walks      R Recommendations: See Admitting Provider Note  Report given to:   Additional Notes:  Pt is alert and oriented, speaks arabic, family at bedside translates. Extensive history of issues/complications with CSF levels, serial Lps. Legally blind. Unable to sit up without significant pain in head. Feels much better in supine position. LP attempted but was unsuccessful in ED.  She is on room air, ambulates with guided assistance to restroom. 22g in right AC.

## 2022-12-05 NOTE — Progress Notes (Signed)
PROGRESS NOTE    Courntey Denatale  [id number] DOB: 01-Apr-1966 DOA: 12/04/2022 PCP: Elwyn Reach, MD   Brief Narrative:  Janice Hanson is a 57 y.o. 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   Of interest patient was recently evaluated in [place] for headache on 10/12/2022 having undergone LP.  Since the LP she has had worsening headaches dizziness vomiting as well as reported neck shoulder pain.  She also reports recent episodes of diarrhea.  Patient previously on Diamox but discontinued due to tachycardia dizziness and reported facial numbness.  Patient's headache and nausea appear to be worsening over the past 10 days, exacerbated by upright position or ambulation.  Improving while supine.  Hospitalist called for admission and neurology consulted in the setting of suspected CSF leak.  Assessment & Plan:   Principal Problem:   Headache Active Problems:   IIH (idiopathic intracranial hypertension)   Hyperlipidemia   Hypertension   Acute intractable headache; IIH Rule out CSF leak - Appreciate neurology assessment and recommendations, suspecting CSF leak -MRI and MR venogram showed partially empty sella with focal stenosis of the transverse and sigmoid sinuses consistent with history of IIH. -IR consulted for blood patch, otherwise liberal caffeine per neurology   Hypertension, hyperlipidemia, pre-diabetes  - These have been managed with lifestyle interventions  -Currently no indication for medication/treatment or further evaluation at this time for his chronic conditions  DVT prophylaxis: Holding given above Code Status: Full Family Communication: Daughter at bedside  Status is: Observation  Dispo: The patient is from: Home               Anticipated d/c is to: Home              Anticipated d/c date is: 24 to 48 hours              Patient currently not medically stable for discharge given ongoing need for intervention, blood patch as well as close monitoring over the next 24 hours postprocedure to ensure no exacerbation of symptoms.  Consultants:  Neurology, IR  Procedures:  Pending blood patch evaluation  Antimicrobials:  None  Subjective: No acute issues or events overnight, headache appears to be moderately well-controlled while supine, symptoms of nausea and vomiting resolved with Zofran.  Otherwise anxious about procedure.  Objective: Vitals:   12/04/22 2315 12/05/22 0000 12/05/22 0211 12/05/22 0220  BP: 124/67 128/65  (!) 109/51  Pulse: 71 73  65  Resp: 18 16  16  $ Temp: 98.4 F (36.9 C)   (!) 97.3 F (36.3 C)  TempSrc: Oral   Oral  SpO2: 100% 100%  98%  Weight:   123.4 kg   Height:   5' 8"$  (1.727 m)     Intake/Output Summary (Last 24 hours) at 12/05/2022 0733 Last data filed at 12/05/2022 0700 Gross per 24 hour  Intake 1932.66 ml  Output --  Net 1932.66 ml   Filed Weights   12/05/22 0211  Weight: 123.4 kg    Examination:  General:  Pleasantly resting in bed, No acute distress. HEENT:  Normocephalic atraumatic.  Sclerae nonicteric, noninjected.  Extraocular movements intact bilaterally. Neck:  Without mass or deformity.  Trachea is midline. Lungs:  Clear to auscultate bilaterally without rhonchi, wheeze, or rales. Heart:  Regular rate and rhythm.  Without murmurs, rubs, or gallops. Abdomen:  Soft, nontender,  nondistended.  Without guarding or rebound. Extremities: Without cyanosis, clubbing, edema, or obvious deformity. Skin:  Warm and dry, no erythema  Data Reviewed: I have personally reviewed following labs and imaging studies  CBC: Recent Labs  Lab 12/04/22 1927  WBC 5.9  NEUTROABS 2.5  HGB 14.0  HCT 43.5  MCV 84.6  PLT [id number]   Basic Metabolic Panel: Recent Labs  Lab  12/04/22 1927  NA 140  K 4.0  CL 106  CO2 27  GLUCOSE 100*  BUN 8  CREATININE 0.61  CALCIUM 8.7*   GFR: Estimated Creatinine Clearance: 107.4 mL/min (by C-G formula based on SCr of 0.61 mg/dL). Liver Function Tests: Recent Labs  Lab 12/04/22 1927  AST 19  ALT 18  ALKPHOS 77  BILITOT 0.7  PROT 7.0  ALBUMIN 3.0*   No results for input(s): "LIPASE", "AMYLASE" in the last 168 hours. No results for input(s): "AMMONIA" in the last 168 hours. Coagulation Profile: No results for input(s): "INR", "PROTIME" in the last 168 hours. Cardiac Enzymes: No results for input(s): "CKTOTAL", "CKMB", "CKMBINDEX", "TROPONINI" in the last 168 hours. BNP (last 3 results) No results for input(s): "PROBNP" in the last 8760 hours. HbA1C: No results for input(s): "HGBA1C" in the last 72 hours. CBG: No results for input(s): "GLUCAP" in the last 168 hours. Lipid Profile: No results for input(s): "CHOL", "HDL", "LDLCALC", "TRIG", "CHOLHDL", "LDLDIRECT" in the last 72 hours. Thyroid Function Tests: No results for input(s): "TSH", "T4TOTAL", "FREET4", "T3FREE", "THYROIDAB" in the last 72 hours. Anemia Panel: No results for input(s): "VITAMINB12", "FOLATE", "FERRITIN", "TIBC", "IRON", "RETICCTPCT" in the last 72 hours. Sepsis Labs: No results for input(s): "PROCALCITON", "LATICACIDVEN" in the last 168 hours.  No results found for this or any previous visit (from the past 240 hour(s)).       Radiology Studies: MR BRAIN W WO CONTRAST  Result Date: 12/05/2022 CLINICAL DATA:  Initial evaluation for headache. EXAM: MRI HEAD WITHOUT AND WITH CONTRAST MRV HEAD WITHOUT CONTRAST TECHNIQUE: Multiplanar, multiecho pulse sequences of the brain and surrounding structures were obtained without and with intravenous contrast. Angiographic images of the intracranial venous structures were obtained using MRV technique without intravenous contrast. CONTRAST:  5m GADAVIST GADOBUTROL 1 MMOL/ML IV SOLN COMPARISON:   Prior study from 12/01/2021. FINDINGS: MRI HEAD FINDINGS: Brain: Cerebral volume within normal limits for age. Mild scattered patchy T2/FLAIR hyperintensity involving the deep white matter of both anterior frontal lobes, nonspecific, but mild in nature, and stable from prior. No abnormal foci of restricted diffusion to suggest acute or subacute ischemia. Gray-white matter differentiation well maintained. No encephalomalacia to suggest chronic cortical infarction or other insult. No foci of susceptibility artifact indicative of acute or chronic intracranial blood products. No mass lesion, midline shift or mass effect. Ventricles normal in size and morphology without hydrocephalus. No extra-axial fluid collection. Partially empty sella noted.  Suprasellar region normal. No abnormal enhancement. Vascular: Right vertebral artery hypoplastic and not well seen. Major intracranial vascular flow voids are otherwise maintained. Skull and upper cervical spine: Craniocervical junction within normal limits. Bone marrow signal intensity normal. No scalp soft tissue abnormality. Sinuses/Orbits: Globes and orbital soft tissues demonstrate no acute finding. Mild mucosal thickening present about the paranasal sinuses. No air-fluid levels. Moderate right with small left mastoid effusions, of uncertain significance. Negative nasopharynx. Other: None. MRV HEAD FINDINGS: Normal flow related signal seen throughout the superior sagittal sinus to the torcula. Transverse and sigmoid sinuses are patent as are the jugular bulbs and visualized proximal internal jugular  veins. Focal stenoses at the junctions of the transverse and sigmoid sinuses bilaterally. Straight sinus, vein of Galen, and internal cerebral veins are patent. No visible cortical vein abnormality. IMPRESSION: 1. Partially empty sella with focal stenoses at the junctions of the transverse and sigmoid sinuses bilaterally. Findings consistent with history of idiopathic  intracranial hypertension. 2. No other acute intracranial abnormality. 3. Mild cerebral white matter changes, nonspecific, with primary differential considerations including changes of chronic microvascular ischemic disease or migrainous disorder. 4. Moderate right with small left mastoid effusions, of uncertain significance. Correlation with physical exam recommended. 5. No evidence for dural sinus thrombosis. Electronically Signed   By: Jeannine Boga M.D.   On: 12/05/2022 01:44   MR Venogram Head  Result Date: 12/05/2022 CLINICAL DATA:  Initial evaluation for headache. EXAM: MRI HEAD WITHOUT AND WITH CONTRAST MRV HEAD WITHOUT CONTRAST TECHNIQUE: Multiplanar, multiecho pulse sequences of the brain and surrounding structures were obtained without and with intravenous contrast. Angiographic images of the intracranial venous structures were obtained using MRV technique without intravenous contrast. CONTRAST:  21m GADAVIST GADOBUTROL 1 MMOL/ML IV SOLN COMPARISON:  Prior study from 12/01/2021. FINDINGS: MRI HEAD FINDINGS: Brain: Cerebral volume within normal limits for age. Mild scattered patchy T2/FLAIR hyperintensity involving the deep white matter of both anterior frontal lobes, nonspecific, but mild in nature, and stable from prior. No abnormal foci of restricted diffusion to suggest acute or subacute ischemia. Gray-white matter differentiation well maintained. No encephalomalacia to suggest chronic cortical infarction or other insult. No foci of susceptibility artifact indicative of acute or chronic intracranial blood products. No mass lesion, midline shift or mass effect. Ventricles normal in size and morphology without hydrocephalus. No extra-axial fluid collection. Partially empty sella noted.  Suprasellar region normal. No abnormal enhancement. Vascular: Right vertebral artery hypoplastic and not well seen. Major intracranial vascular flow voids are otherwise maintained. Skull and upper cervical  spine: Craniocervical junction within normal limits. Bone marrow signal intensity normal. No scalp soft tissue abnormality. Sinuses/Orbits: Globes and orbital soft tissues demonstrate no acute finding. Mild mucosal thickening present about the paranasal sinuses. No air-fluid levels. Moderate right with small left mastoid effusions, of uncertain significance. Negative nasopharynx. Other: None. MRV HEAD FINDINGS: Normal flow related signal seen throughout the superior sagittal sinus to the torcula. Transverse and sigmoid sinuses are patent as are the jugular bulbs and visualized proximal internal jugular veins. Focal stenoses at the junctions of the transverse and sigmoid sinuses bilaterally. Straight sinus, vein of Galen, and internal cerebral veins are patent. No visible cortical vein abnormality. IMPRESSION: 1. Partially empty sella with focal stenoses at the junctions of the transverse and sigmoid sinuses bilaterally. Findings consistent with history of idiopathic intracranial hypertension. 2. No other acute intracranial abnormality. 3. Mild cerebral white matter changes, nonspecific, with primary differential considerations including changes of chronic microvascular ischemic disease or migrainous disorder. 4. Moderate right with small left mastoid effusions, of uncertain significance. Correlation with physical exam recommended. 5. No evidence for dural sinus thrombosis. Electronically Signed   By: BJeannine BogaM.D.   On: 12/05/2022 01:44        Scheduled Meds:  sodium chloride flush  3 mL Intravenous Q12H   Continuous Infusions:  sodium chloride 150 mL/hr at 12/05/22 0318     LOS: 0 days    Time spent: 445m    Ballard Budney C Rosie Torrez, DO [facility]  If 7PM-7AM, please contact night-coverage [email or web address]  12/05/2022, 7:33 AM

## 2022-12-05 NOTE — Progress Notes (Signed)
The patient is admitted to [address] and she was accompanied by her husband and daughter. A & O x 4. Denied any acute pain at this time. The patient is oriented to staff, call bell/ascom. Patient and her family voiced no concern. Will continue to monitor.

## 2022-12-05 NOTE — ED Notes (Signed)
Patient transported to MRI 

## 2022-12-05 NOTE — Progress Notes (Signed)
Patient Status: [facility] - In-pt  Assessment and Plan: 57 year old female with history of IIH s/p LP 10/12/22 in [place] now presents with persistent headache and symptoms consistent with CSF leak. IR consulted for blood patch.  Patient legally blind, lying in bed with family at bedside. She refuses iPAD interpreter and strongly relies on her family for support and interpretation. Her husband and sister at bedside both provide assistance.  She complains of a headache that is only improved with lying flat.   Headache has been present since her LP in [place] in December.   No records available from her prior LP which as performed at an unknown level and laterality.  Empiric blood patch attempt approved by Dr. Dwaine Gale.   Risks and benefits was discussed with the patient and/or patient's family including, but not limited to bleeding, infection, damage to adjacent structures or nerves.  All of the questions were answered and there is agreement to proceed.  Consent signed and in chart.  ______________________________________________________________________   History of Present Illness: Janice Hanson is a 57 y.o. female with past medical history of remote IIH, migraine headaches, occipital neuralgia, B12 deficiency who is s/p LP in [place] 0000000 complicated by dizziness, vomiting, neck and shoulder pain.  Her headache has persistent, worsening in the past 10 days.  An LP was attempted at bedside in the ED without return of CSF.  Seen by Neurology who has recommended blood patch. Case reviewed by Dr. Dwaine Gale who notes there are several potential limitations in performing blood patch s/p LP from several weeks ago of unknown site and laterality, however given subjective history and symptomology, blood patch attempt approved.    Allergies and medications reviewed.   Review of Systems: A 12 point ROS discussed and pertinent positives are indicated in the HPI above.  All other systems are negative.  Review of  Systems  Constitutional:  Negative for fatigue and fever.  Respiratory:  Negative for cough and shortness of breath.   Cardiovascular:  Negative for chest pain.  Gastrointestinal:  Negative for abdominal pain.  Musculoskeletal:  Negative for back pain.  Psychiatric/Behavioral:  Negative for behavioral problems and confusion.     Vital Signs: BP (!) 109/52 (BP Location: Left Arm)   Pulse 78   Temp 98 F (36.7 C) (Oral)   Resp 18   Ht 5' 8"$  (1.727 m)   Wt 272 lb 0.8 oz (123.4 kg)   SpO2 100%   BMI 41.36 kg/m   Physical Exam Vitals and nursing note reviewed.  Constitutional:      General: She is not in acute distress.    Appearance: Normal appearance. She is not ill-appearing.  HENT:     Mouth/Throat:     Mouth: Mucous membranes are moist.     Pharynx: Oropharynx is clear.  Cardiovascular:     Rate and Rhythm: Normal rate and regular rhythm.  Pulmonary:     Effort: Pulmonary effort is normal. No respiratory distress.     Breath sounds: Normal breath sounds.  Neurological:     General: No focal deficit present.     Mental Status: She is alert and oriented to person, place, and time. Mental status is at baseline.  Psychiatric:        Mood and Affect: Mood normal.        Behavior: Behavior normal.      Imaging reviewed.   Labs:  COAGS: No results for input(s): "INR", "APTT" in the last 8760 hours.  BMP:  Recent Labs    01/25/22 1441 12/04/22 1927 12/05/22 0716  NA 141 140 141  K 4.4 4.0 3.8  CL 104 106 109  CO2 26 27 25  $ GLUCOSE 127* 100* 108*  BUN 10 8 6  $ CALCIUM 9.7 8.7* 8.1*  CREATININE 0.89 0.61 0.59  GFRNONAA  --  >60 >60       Electronically Signed: Docia Barrier, PA 12/05/2022, 3:30 PM   I spent a total of 15 minutes in face to face in clinical consultation, greater than 50% of which was counseling/coordinating care for CSF leak.

## 2022-12-06 DIAGNOSIS — G4489 Other headache syndrome: Secondary | ICD-10-CM | POA: Diagnosis not present

## 2022-12-06 DIAGNOSIS — G970 Cerebrospinal fluid leak from spinal puncture: Secondary | ICD-10-CM | POA: Diagnosis not present

## 2022-12-06 MED ORDER — MECLIZINE HCL 25 MG PO TABS
50.0000 mg | ORAL_TABLET | Freq: Once | ORAL | Status: AC
  Administered 2022-12-06: 50 mg via ORAL
  Filled 2022-12-06: qty 2

## 2022-12-06 MED ORDER — PANTOPRAZOLE SODIUM 40 MG IV SOLR
40.0000 mg | Freq: Two times a day (BID) | INTRAVENOUS | Status: DC
  Administered 2022-12-06 – 2022-12-07 (×2): 40 mg via INTRAVENOUS
  Filled 2022-12-06 (×2): qty 10

## 2022-12-06 MED ORDER — POLYVINYL ALCOHOL 1.4 % OP SOLN
1.0000 [drp] | OPHTHALMIC | Status: DC | PRN
  Administered 2022-12-06: 1 [drp] via OPHTHALMIC
  Filled 2022-12-06: qty 15

## 2022-12-06 MED ORDER — MECLIZINE HCL 50 MG PO TABS: 50.0000 mg | ORAL_TABLET | Freq: Three times a day (TID) | ORAL | 0 refills | Status: DC | PRN

## 2022-12-06 MED ORDER — CAFFEINE 200 MG PO TABS
200.0000 mg | ORAL_TABLET | Freq: Once | ORAL | Status: AC
  Administered 2022-12-06: 200 mg via ORAL
  Filled 2022-12-06: qty 1

## 2022-12-06 NOTE — Discharge Summary (Addendum)
Physician Discharge Summary  Janice Hanson [id number] DOB: [birth year] DOA: 12/04/2022  PCP: Elwyn Reach, MD  Admit date: 12/04/2022 Discharge date: 12/06/2022  Admitted From: Home Disposition:  Home  Recommendations for Outpatient Follow-up:  Follow up with PCP in 1-2 weeks  Home Health:None   Equipment/Devices:None  Discharge Condition:Stable  CODE STATUS: Full  Diet recommendation:  Low salt low fat diet  Brief/Interim Summary: Janice Hanson is a 57 y.o. 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    Of interest patient was recently evaluated in [place] for headache on 10/12/2022 having undergone LP.  Since the LP she has had worsening headaches dizziness vomiting as well as reported neck shoulder pain.  She also reports recent episodes of diarrhea.   Patient previously on Diamox but discontinued due to tachycardia dizziness and reported facial numbness.  Patient's headache and nausea appear to be worsening over the past 10 days, exacerbated by upright position or ambulation.  Improving while supine.   Hospitalist called for admission and neurology consulted in the setting of suspected CSF leak from prior LP in [place]. IR consulted - successful blood patch 12/05/22 - plan to DC home today with close follow up with PCP as scheduled. Symptoms continue to improve but not yet resolved.  **UPDATE Patient had ongoing, questionably worsening symptoms over the afternoon - DC held for possible re-evaluation by IR for repeat blood patch if not improving in the next 24h. Added protonix, meclizine, and caffeine to her regimen in hopes to combat symptoms in the interim.  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 -IR consulted for blood patch - successful patch 12/05/22 -Liberal caffeine use per neurology   Hypertension, hyperlipidemia, pre-diabetes  - These have been managed with lifestyle interventions  -Currently no indication for medication/treatment or further evaluation at this time for his chronic conditions   Discharge Instructions   Allergies as of 12/06/2022   No Known Allergies      Medication List     TAKE these medications    acetaminophen 500 MG tablet Commonly known as: TYLENOL Take 500 mg by mouth every 6 (six) hours as needed for moderate pain or headache.   atorvastatin 40 MG tablet Commonly known as: LIPITOR Take 40 mg by mouth at bedtime.   B-12 PO Take 1 tablet by mouth daily.   hydrochlorothiazide 25 MG tablet Commonly known as: HYDRODIURIL Take 25 mg by mouth daily.   omeprazole 40 MG capsule Commonly known as: PRILOSEC Take 40 mg by mouth daily.        No Known Allergies  Consultations: Neurology, Interventional Radiology   Procedures/Studies: MR BRAIN W WO CONTRAST  Result Date: 12/05/2022 CLINICAL DATA:  Initial evaluation for headache. EXAM: MRI HEAD WITHOUT AND WITH CONTRAST MRV HEAD WITHOUT CONTRAST TECHNIQUE: Multiplanar, multiecho pulse sequences of the brain and surrounding structures were obtained without and with intravenous contrast. Angiographic images of the intracranial venous structures were obtained using MRV technique without intravenous contrast. CONTRAST:  9m GADAVIST GADOBUTROL 1 MMOL/ML IV  SOLN COMPARISON:  Prior study from 12/01/2021. FINDINGS: MRI HEAD FINDINGS:  Brain: Cerebral volume within normal limits for age. Mild scattered patchy T2/FLAIR hyperintensity involving the deep white matter of both anterior frontal lobes, nonspecific, but mild in nature, and stable from prior. No abnormal foci of restricted diffusion to suggest acute or subacute ischemia. Gray-white matter differentiation well maintained. No encephalomalacia to suggest chronic cortical infarction or other insult. No foci of susceptibility artifact indicative of acute or chronic intracranial blood products. No mass lesion, midline shift or mass effect. Ventricles normal in size and morphology without hydrocephalus. No extra-axial fluid collection. Partially empty sella noted.  Suprasellar region normal. No abnormal enhancement. Vascular: Right vertebral artery hypoplastic and not well seen. Major intracranial vascular flow voids are otherwise maintained. Skull and upper cervical spine: Craniocervical junction within normal limits. Bone marrow signal intensity normal. No scalp soft tissue abnormality. Sinuses/Orbits: Globes and orbital soft tissues demonstrate no acute finding. Mild mucosal thickening present about the paranasal sinuses. No air-fluid levels. Moderate right with small left mastoid effusions, of uncertain significance. Negative nasopharynx. Other: None. MRV HEAD FINDINGS: Normal flow related signal seen throughout the superior sagittal sinus to the torcula. Transverse and sigmoid sinuses are patent as are the jugular bulbs and visualized proximal internal jugular veins. Focal stenoses at the junctions of the transverse and sigmoid sinuses bilaterally. Straight sinus, vein of Galen, and internal cerebral veins are patent. No visible cortical vein abnormality. IMPRESSION: 1. Partially empty sella with focal stenoses at the junctions of the transverse and sigmoid sinuses bilaterally. Findings consistent with history of idiopathic intracranial hypertension. 2. No other acute intracranial  abnormality. 3. Mild cerebral white matter changes, nonspecific, with primary differential considerations including changes of chronic microvascular ischemic disease or migrainous disorder. 4. Moderate right with small left mastoid effusions, of uncertain significance. Correlation with physical exam recommended. 5. No evidence for dural sinus thrombosis. Electronically Signed   By: Jeannine Boga M.D.   On: 12/05/2022 01:44   MR Venogram Head  Result Date: 12/05/2022 CLINICAL DATA:  Initial evaluation for headache. EXAM: MRI HEAD WITHOUT AND WITH CONTRAST MRV HEAD WITHOUT CONTRAST TECHNIQUE: Multiplanar, multiecho pulse sequences of the brain and surrounding structures were obtained without and with intravenous contrast. Angiographic images of the intracranial venous structures were obtained using MRV technique without intravenous contrast. CONTRAST:  34m GADAVIST GADOBUTROL 1 MMOL/ML IV SOLN COMPARISON:  Prior study from 12/01/2021. FINDINGS: MRI HEAD FINDINGS: Brain: Cerebral volume within normal limits for age. Mild scattered patchy T2/FLAIR hyperintensity involving the deep white matter of both anterior frontal lobes, nonspecific, but mild in nature, and stable from prior. No abnormal foci of restricted diffusion to suggest acute or subacute ischemia. Gray-white matter differentiation well maintained. No encephalomalacia to suggest chronic cortical infarction or other insult. No foci of susceptibility artifact indicative of acute or chronic intracranial blood products. No mass lesion, midline shift or mass effect. Ventricles normal in size and morphology without hydrocephalus. No extra-axial fluid collection. Partially empty sella noted.  Suprasellar region normal. No abnormal enhancement. Vascular: Right vertebral artery hypoplastic and not well seen. Major intracranial vascular flow voids are otherwise maintained. Skull and upper cervical spine: Craniocervical junction within normal limits. Bone  marrow signal intensity normal. No scalp soft tissue abnormality. Sinuses/Orbits: Globes and orbital soft tissues demonstrate no acute finding. Mild mucosal thickening present about the paranasal sinuses. No air-fluid levels. Moderate right with small left mastoid effusions, of uncertain significance. Negative nasopharynx. Other: None. MRV HEAD FINDINGS: Normal flow related  signal seen throughout the superior sagittal sinus to the torcula. Transverse and sigmoid sinuses are patent as are the jugular bulbs and visualized proximal internal jugular veins. Focal stenoses at the junctions of the transverse and sigmoid sinuses bilaterally. Straight sinus, vein of Galen, and internal cerebral veins are patent. No visible cortical vein abnormality. IMPRESSION: 1. Partially empty sella with focal stenoses at the junctions of the transverse and sigmoid sinuses bilaterally. Findings consistent with history of idiopathic intracranial hypertension. 2. No other acute intracranial abnormality. 3. Mild cerebral white matter changes, nonspecific, with primary differential considerations including changes of chronic microvascular ischemic disease or migrainous disorder. 4. Moderate right with small left mastoid effusions, of uncertain significance. Correlation with physical exam recommended. 5. No evidence for dural sinus thrombosis. Electronically Signed   By: Jeannine Boga M.D.   On: 12/05/2022 01:44     Subjective: No acute issues/events overnight   Discharge Exam: Vitals:   12/06/22 0407 12/06/22 0732  BP: 112/69 (!) 116/57  Pulse: 67 64  Resp: 18 16  Temp: 97.8 F (36.6 C) 97.8 F (36.6 C)  SpO2: 94% 100%   Vitals:   12/05/22 2337 12/06/22 0100 12/06/22 0407 12/06/22 0732  BP: 119/64  112/69 (!) 116/57  Pulse: 74  67 64  Resp: 18  18 16  $ Temp: 97.7 F (36.5 C)  97.8 F (36.6 C) 97.8 F (36.6 C)  TempSrc: Oral  Oral Oral  SpO2: (!) 86%  94% 100%  Weight:  127.2 kg    Height:        General:  Pt is alert, awake, not in acute distress Cardiovascular: RRR, S1/S2 +, no rubs, no gallops Respiratory: CTA bilaterally, no wheezing, no rhonchi Abdominal: Soft, NT, ND, bowel sounds + Extremities: no edema, no cyanosis    The results of significant diagnostics from this hospitalization (including imaging, microbiology, ancillary and laboratory) are listed below for reference.     Microbiology: No results found for this or any previous visit (from the past 240 hour(s)).   Labs: BNP (last 3 results) No results for input(s): "BNP" in the last 8760 hours. Basic Metabolic Panel: Recent Labs  Lab 12/04/22 1927 12/05/22 0716  NA 140 141  K 4.0 3.8  CL 106 109  CO2 27 25  GLUCOSE 100* 108*  BUN 8 6  CREATININE 0.61 0.59  CALCIUM 8.7* 8.1*  MG  --  1.8   Liver Function Tests: Recent Labs  Lab 12/04/22 1927  AST 19  ALT 18  ALKPHOS 77  BILITOT 0.7  PROT 7.0  ALBUMIN 3.0*    CBC: Recent Labs  Lab 12/04/22 1927 12/05/22 0716  WBC 5.9 5.2  NEUTROABS 2.5  --   HGB 14.0 12.9  HCT 43.5 38.7  MCV 84.6 82.9  PLT 291 259    Urinalysis    Component Value Date/Time   COLORURINE YELLOW 10/31/2020 [place] 10/31/2020 1716   LABSPEC 1.011 10/31/2020 1716   PHURINE 7.0 10/31/2020 [place] 10/31/2020 1716   HGBUR NEGATIVE 10/31/2020 [place] 10/31/2020 [place] 10/31/2020 1716   PROTEINUR NEGATIVE 10/31/2020 1716   UROBILINOGEN 0.2 08/16/2014 0144   NITRITE NEGATIVE 10/31/2020 [place] 10/31/2020 1716   Sepsis Labs Recent Labs  Lab 12/04/22 1927 12/05/22 0716  WBC 5.9 5.2   Microbiology No results found for this or any previous visit (from the past 240 hour(s)).   Time coordinating discharge: Over  30 minutes  SIGNED:   Little Ishikawa, DO [facility] 12/06/2022, 9:39 AM Pager   If 7PM-7AM, please contact night-coverage [email or web address]

## 2022-12-06 NOTE — Plan of Care (Signed)

## 2022-12-06 NOTE — TOC Transition Note (Signed)
Transition of Care [facility]) - CM/SW Discharge Note   Patient Details  Name: Janice Hanson MRN: [id number] Date of Birth: Feb 11, 1966  [facility] of [facility]) CM/SW Contact:  Pollie Friar, RN Phone Number: 12/06/2022, 3:08 PM   Clinical Narrative:    Pt is discharging home with self care. No needs per TOC.   Final next level of care: Home/Self Care Barriers to Discharge: No Barriers Identified   Patient Goals and CMS Choice      Discharge Placement                         Discharge Plan and Services Additional resources added to the After Visit Summary for                                       Social Determinants of Health (SDOH) Interventions SDOH Screenings   Food Insecurity: No Food Insecurity (12/05/2022)  Housing: Low Risk  (12/05/2022)  Transportation Needs: No Transportation Needs (12/05/2022)  Utilities: Not At Risk (12/05/2022)  Tobacco Use: Low Risk  (12/04/2022)     Readmission Risk Interventions     No data to display

## 2022-12-07 ENCOUNTER — Other Ambulatory Visit (HOSPITAL_COMMUNITY): Payer: Self-pay

## 2022-12-07 DIAGNOSIS — G970 Cerebrospinal fluid leak from spinal puncture: Secondary | ICD-10-CM | POA: Diagnosis not present

## 2022-12-07 MED ORDER — PANTOPRAZOLE SODIUM 40 MG PO TBEC
40.0000 mg | DELAYED_RELEASE_TABLET | Freq: Every day | ORAL | Status: DC
  Administered 2022-12-07: 40 mg via ORAL
  Filled 2022-12-07: qty 1

## 2022-12-07 MED ORDER — MECLIZINE HCL 25 MG PO TABS
50.0000 mg | ORAL_TABLET | Freq: Three times a day (TID) | ORAL | 1 refills | Status: AC | PRN
  Filled 2022-12-07: qty 60, 10d supply, fill #0

## 2022-12-07 MED ORDER — ONDANSETRON HCL 4 MG PO TABS
4.0000 mg | ORAL_TABLET | Freq: Four times a day (QID) | ORAL | 1 refills | Status: AC | PRN
  Filled 2022-12-07: qty 20, 5d supply, fill #0

## 2022-12-07 MED ORDER — ACETAMINOPHEN 325 MG PO TABS
650.0000 mg | ORAL_TABLET | Freq: Four times a day (QID) | ORAL | Status: DC | PRN
  Administered 2022-12-07: 650 mg via ORAL
  Filled 2022-12-07: qty 2

## 2022-12-07 MED ORDER — PANTOPRAZOLE SODIUM 40 MG PO TBEC: 40.0000 mg | DELAYED_RELEASE_TABLET | Freq: Two times a day (BID) | ORAL | Status: DC

## 2022-12-07 MED ORDER — ATORVASTATIN CALCIUM 40 MG PO TABS: 40.0000 mg | ORAL_TABLET | Freq: Every day | ORAL | Status: DC

## 2022-12-07 NOTE — Discharge Summary (Signed)
DISCHARGE SUMMARY  Janice Hanson  MR#: [id number]  DOB:Sep 10, 1966  Date of Admission: 12/04/2022 Date of Discharge: 12/07/2022  Attending Physician:Alizey Noren Hennie Duos, MD  Patient's [id number], Janice Newcomer, MD  Consults: Neurology IR  Disposition: D/C home   Follow-up Appts:  Follow-up Information     Melvenia Beam, MD Follow up in 1 week(s).   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 Patient had been evaluated for a headache while in [place] 10/12/2022 and underwent an LP at that time. Since the time of the LP she has had worsening headaches dizziness and vomiting with neck and shoulder pain. Symptoms were worsened when sitting in the upright position and improved when supine.   Hospital Course:  CSF leak -intractable headache -idiopathic intracranial HTN MRI and MR venogram showed partially empty sella with focal stenosis of the transverse and sigmoid sinuses consistent with a history of IIH -IR consulted for blood patch which was completed 2/12 at ~5:20 PM - liberal caffeine intake suggested - HA transiently worsened 2/13 despite blood patch, so d/c was delayed - pt markedly improved by 2/14 and desired d/c home - she was medically stable and therefore was d/c home w/ family    HTN Has been managing with lifestyle interventions   HLD Has been managing with lifestyle modifications   Prediabetes Not currently  requiring medical therapy  Allergies as of 12/07/2022   No Known Allergies      Medication List     TAKE these medications    acetaminophen 500 MG tablet Commonly known as: TYLENOL Take 500 mg by mouth every 6 (six) hours as needed for moderate pain or headache.   atorvastatin 40 MG tablet Commonly known as: LIPITOR Take 40 mg by mouth at bedtime.   B-12 PO Take 1 tablet by mouth daily.   hydrochlorothiazide 25 MG tablet Commonly known as: HYDRODIURIL Take 25 mg by mouth daily.   meclizine 50 MG tablet Commonly known as: ANTIVERT Take 1 tablet (50 mg total) by mouth 3 (three) times daily as needed. For dizziness   omeprazole 40 MG capsule Commonly known as: PRILOSEC Take 40 mg by mouth daily.   ondansetron 4 MG tablet Commonly known as: ZOFRAN Take 1 tablet (4 mg total) by mouth every 6 (six) hours as needed for nausea.        Day of Discharge BP (!) 110/58 (BP Location: Left Arm)   Pulse 75   Temp 98.1 F (36.7 C) (Oral)   Resp 18   Ht 5' 8"$  (1.727 m)   Wt 122.9 kg   SpO2 92%   BMI 41.20 kg/m   Physical Exam: General: No acute respiratory distress Lungs: Clear to auscultation bilaterally without wheezes or crackles Cardiovascular: Regular rate and rhythm without murmur gallop or rub normal S1 and S2 Abdomen: Nontender, nondistended, soft, bowel sounds positive, no rebound, no ascites, no appreciable mass Extremities: No significant cyanosis, clubbing, or edema bilateral lower extremities  Basic Metabolic Panel: Recent  Labs  Lab 12/04/22 1927 12/05/22 0716  NA 140 141  K 4.0 3.8  CL 106 109  CO2 27 25  GLUCOSE 100* 108*  BUN 8 6  CREATININE 0.61 0.59  CALCIUM 8.7* 8.1*  MG  --  1.8   CBC: Recent Labs  Lab 12/04/22 1927 12/05/22 0716  WBC 5.9 5.2  NEUTROABS 2.5  --   HGB 14.0 12.9  HCT 43.5 38.7  MCV 84.6 82.9  PLT 291 259    Time spent in discharge (includes decision making & examination of pt): 35 minutes  12/07/2022, 12:33  PM   Cherene Altes, MD [facility] Office  [phone]

## 2022-12-15 ENCOUNTER — Telehealth: Payer: Self-pay | Admitting: Neurology

## 2022-12-15 NOTE — Telephone Encounter (Signed)
Tried to return the daughter's call [place] on [facility]). The VM was full. Will send mychart message.

## 2022-12-15 NOTE — Telephone Encounter (Signed)
Hey Dr. Jaynee Eagles,  Patient called in today in regards to scheduling a follow up with you for recent ED visit. It looks like it's for chronic headache, IIH and CSF leak. They also recommended a follow up in 1 week. Could you review and assess the urgency? Thank you!

## 2022-12-15 NOTE — Telephone Encounter (Signed)
Pt's daughter called stating that the pt had a leak in the injection site of the Spinal Tap and the hospital stated that her Neurologist would call her back to schedule a sooner f/u. Please advise.

## 2022-12-15 NOTE — Telephone Encounter (Signed)
Her IDIOPATHIC INTRACRANIAL HYPERTENSION has been stable(resolved) for years and I have seen her many times for headaches. It is ok for an NP to see her first available. Janice Hanson saw her in the past, first avail with megan.

## 2022-12-16 ENCOUNTER — Ambulatory Visit: Payer: Self-pay

## 2022-12-16 NOTE — Telephone Encounter (Signed)
  Chief Complaint: unable to sit up dizziness Symptoms: rigors, ears ringing Frequency: today Pertinent Negatives: Patient denies headache, numbness, vomiting, ear ache Disposition: [x]$ ED /[]$ Urgent Care (no appt availability in office) / []$ Appointment(In office/virtual)/ []$  [facility]/ []$ Home Care/ []$ Refused Recommended Disposition /[]$ [facility]/ []$  Follow-up with PCP Additional Notes: also advised to call PCP to discuss. Advised to go ahead to ED if unable to reach PCP. Reason for Disposition  SEVERE dizziness (vertigo) (e.g., unable to walk without assistance)  Answer Assessment - Initial Assessment Questions 1. DESCRIPTION: "Describe your dizziness."     Constant dizziness 2. VERTIGO: "Do you feel like either you or the room is spinning or tilting?"      no 3. LIGHTHEADED: "Do you feel lightheaded?" (e.g., somewhat faint, woozy, weak upon standing)     yes 4. SEVERITY: "How bad is it?"  "Can you walk?"   - MILD: Feels slightly dizzy and unsteady, but is walking normally.   - MODERATE: Feels unsteady when walking, but not falling; interferes with normal activities (e.g., school, work).   - SEVERE: Unable to walk without falling, or requires assistance to walk without falling.     severe 5. ONSET:  "When did the dizziness begin?"     Today  6. AGGRAVATING FACTORS: "Does anything make it worse?" (e.g., standing, change in head position)     Unable to walk,  7. CAUSE: "What do you think is causing the dizziness?"     Had a blood patch done early Feb 8. RECURRENT SYMPTOM: "Have you had dizziness before?" If Yes, ask: "When was the last time?" "What happened that time?"     yes 9. OTHER SYMPTOMS: "Do you have any other symptoms?" (e.g., headache, weakness, numbness, vomiting, earache)     Rigors, unable to sit up, ringing to both ears, dizziness sitting  Protocols used: Dizziness - Vertigo-A-AH

## 2022-12-18 NOTE — Progress Notes (Unsigned)
PATIENT: Janice Hanson DOB: 09-11-1966  REASON FOR VISIT: follow up HISTORY FROM: patient PRIMARY NEUROLOGIST: Dr Jaynee Eagles  No chief complaint on file.    HISTORY OF PRESENT ILLNESS: Today 12/18/22:  03/01/22:Janice Hanson is a 57 year old Arabic female here today with her daughter and interpreter.  In the past she has been seen for migraine headaches.  Today she reports multiple symptoms including dizziness, pain in the neck, choking when eating and pins and needle sensation in the upper legs.  Patient states the most concerning symptom is the pain in her neck.  She states the pain has been there prior to having her lumbar puncture.  Primarily on the left side of the neck.  Pain radiates down to mid bicep.  Denies any significant weakness.  Patient has had recent work-up for pseudotumor cerebri.  Had a lumbar puncture but had to get a blood patch.  Recently saw her ophthalmologist Dr. Katy Fitch papilledema was not present.  Patient has been evaluated by cardiology for ongoing dizziness.  Amlodipine was reduced and the patient reported that her symptoms improved but did not resolve.  REVIEW OF SYSTEMS: Out of a complete 14 system review of symptoms, the patient complains only of the following symptoms, and all other reviewed systems are negative.  ALLERGIES: No Known Allergies  HOME MEDICATIONS: Outpatient Medications Prior to Visit  Medication Sig Dispense Refill   acetaminophen (TYLENOL) 500 MG tablet Take 500 mg by mouth every 6 (six) hours as needed for moderate pain or headache.     atorvastatin (LIPITOR) 40 MG tablet Take 40 mg by mouth at bedtime.     Cyanocobalamin (B-12 PO) Take 1 tablet by mouth daily.     hydrochlorothiazide (HYDRODIURIL) 25 MG tablet Take 25 mg by mouth daily.     meclizine (ANTIVERT) 25 MG tablet Take 2 tablets (50 mg total) by mouth 3 (three) times daily as needed for dizziness 60 tablet 1   omeprazole (PRILOSEC) 40 MG capsule Take 40 mg by mouth daily.      ondansetron (ZOFRAN) 4 MG tablet Take 1 tablet (4 mg total) by mouth every 6 (six) hours as needed for nausea. 20 tablet 1   No facility-administered medications prior to visit.    PAST MEDICAL HISTORY: Past Medical History:  Diagnosis Date   Abnormal finding in CSF    Arthritis    Blindness - both eyes 12/1992   can see shadows w/ left eye   GERD (gastroesophageal reflux disease)    Helicobacter pylori gastritis 11/20/2014   Hyperlipidemia    Hypertension    Hypothyroidism    pt denies   Kidney stones    Low blood pressure    Middle ear infection    Obesity    Pseudomembranous colitis    hx of   Pseudotumor cerebri    followed by Dr. Jaynee Eagles @ [facility]   Right knee meniscal tear    w/ lateral plateau stress fx   Spinal puncture headache 12/10/2021   Syncope 10/31/2015    PAST SURGICAL HISTORY: Past Surgical History:  Procedure Laterality Date   IR FLUORO GUIDED NEEDLE PLC ASPIRATION/INJECTION LOC  12/05/2022   KNEE ARTHROSCOPY Right 10/10/2016   Procedure: ARTHROSCOPY KNEE WITH PARTIAL MEDIAL MENISECTOMY AND LATERAL SUBCHONDROPLASTY;  Surgeon: Nicholes Stairs, MD;  Location: [facility];  Service: Orthopedics;  Laterality: Right;   TONSILLECTOMY      FAMILY HISTORY: Family History  Problem Relation Age of Onset   Stomach cancer Mother  Diabetes Father    Hypertension Father    High blood pressure Brother    Diabetes Brother    Kidney disease Brother    Kidney Stones Maternal Uncle    Diabetes Maternal Grandmother    Heart disease Maternal Grandmother    Heart disease Paternal Grandfather    Colon cancer Neg Hx    Pseudotumor cerebri Neg Hx     SOCIAL HISTORY: Social History   Socioeconomic History   Marital status: Married    Spouse name: Not on file   Number of children: 6   Years of education: Not on file   Highest education level: Not on file  Occupational History   Occupation: na  Tobacco Use   Smoking status:  Never   Smokeless tobacco: Never  Vaping Use   Vaping Use: Never used  Substance and Sexual Activity   Alcohol use: No    Alcohol/week: 0.0 standard drinks of alcohol   Drug use: No   Sexual activity: Not on file  Other Topics Concern   Not on file  Social History Narrative   Married originally from [place]   4 children 2 girls/2 boys   2 children have passed   Caffeine use: 1-2 cups tea daily   Lives with husband and daughter.   Right-handed.   Social Determinants of Health   Financial Resource Strain: Not on file  Food Insecurity: No Food Insecurity (12/05/2022)   Hunger Vital Sign    Worried About Running Out of Food in the Last Year: Never true    Ran Out of Food in the Last Year: Never true  Transportation Needs: No Transportation Needs (12/05/2022)   PRAPARE - Hydrologist (Medical): No    Lack of Transportation (Non-Medical): No  Physical Activity: Not on file  Stress: Not on file  Social Connections: Not on file  Intimate Partner Violence: Not At Risk (12/05/2022)   Humiliation, Afraid, Rape, and Kick questionnaire    Fear of Current or Ex-Partner: No    Emotionally Abused: No    Physically Abused: No    Sexually Abused: No      PHYSICAL EXAM  There were no vitals filed for this visit.  There is no height or weight on file to calculate BMI.  Generalized: Well developed, in no acute distress   Neurological examination  Mentation: Alert oriented to time, place, history taking. Follows all commands speech and language fluent Cranial nerve II-XII: Pupils were equal round reactive to light. Extraocular movements were full, visual field were full on confrontational test. Facial sensation and strength were normal. Uvula tongue midline. Head turning and shoulder shrug  were normal and symmetric. Motor: The motor testing reveals 5 over 5 strength of all 4 extremities.  Mild giveaway weakness noted in the left upper extremity.  Reports  soreness to palpation of the left trapezius muscle Sensory: Sensory testing is intact to soft touch on all 4 extremities. No evidence of extinction is noted.  Coordination: Cerebellar testing reveals good finger-nose-finger and heel-to-shin bilaterally.  Gait and station: Gait is normal.  Reflexes: Deep tendon reflexes are symmetric and normal bilaterally.   DIAGNOSTIC DATA (LABS, IMAGING, TESTING) - I reviewed patient records, labs, notes, testing and imaging myself where available.  Lab Results  Component Value Date   WBC 5.2 12/05/2022   HGB 12.9 12/05/2022   HCT 38.7 12/05/2022   MCV 82.9 12/05/2022   PLT 259 12/05/2022      Component Value  Date/Time   NA 141 12/05/2022 0716   NA 141 01/25/2022 1441   K 3.8 12/05/2022 0716   CL 109 12/05/2022 0716   CO2 25 12/05/2022 0716   GLUCOSE 108 (H) 12/05/2022 0716   BUN 6 12/05/2022 0716   BUN 10 01/25/2022 1441   CREATININE 0.59 12/05/2022 0716   CALCIUM 8.1 (L) 12/05/2022 0716   PROT 7.0 12/04/2022 1927   PROT 8.0 01/25/2022 1441   ALBUMIN 3.0 (L) 12/04/2022 1927   ALBUMIN 4.0 01/25/2022 1441   AST 19 12/04/2022 1927   ALT 18 12/04/2022 1927   ALKPHOS 77 12/04/2022 1927   BILITOT 0.7 12/04/2022 1927   BILITOT 0.3 01/25/2022 1441   GFRNONAA >60 12/05/2022 0716   GFRAA >60 07/05/2019 2047   Lab Results  Component Value Date   CHOL 201 (H) 01/25/2022   HDL 42 01/25/2022   LDLCALC 136 (H) 01/25/2022   LDLDIRECT 127 (H) 01/25/2022   TRIG 129 01/25/2022   Lab Results  Component Value Date   HGBA1C 6.3 (H) 10/07/2015   Lab Results  Component Value Date   VITAMINB12 218 11/04/2015   Lab Results  Component Value Date   TSH 1.590 01/25/2022      ASSESSMENT AND PLAN 57 y.o. year old female  has a past medical history of Abnormal finding in CSF, Arthritis, Blindness - both eyes (12/1992), GERD (gastroesophageal reflux disease), Helicobacter pylori gastritis (11/20/2014), Hyperlipidemia, Hypertension, Hypothyroidism,  Kidney stones, Low blood pressure, Middle ear infection, Obesity, Pseudomembranous colitis, Pseudotumor cerebri, Right knee meniscal tear, Spinal puncture headache (12/10/2021), and Syncope (10/31/2015). here with:  1.  Left-sided neck pain 2.  History of pseudotumor cerebri  Patient comes in today with complaints of multiple symptoms.  Advised the patient that I could not address every symptom today so her most concerning symptom was her neck pain.  Exam was relatively unremarkable.  Advised patient that we could do physical therapy however they wanted to proceed with imaging.  Advised that I could order an MRI of the cervical spine without contrast however the patient is leaving Friday to go to [place].  I will not be able to get the scan approved and scheduled before then.  The patient is okay with waiting until she returns.  Currently does not know when she will be back.  Advised that if her symptoms worsen or she develops new symptoms she should go to urgent care or the ED.  Follow-up on an as-needed basis     Ward Givens, MSN, NP-C 12/18/2022, 4:48 PM [facility] [address] [place],  [address] [phone]

## 2022-12-19 ENCOUNTER — Ambulatory Visit (INDEPENDENT_AMBULATORY_CARE_PROVIDER_SITE_OTHER): Payer: BC Managed Care – PPO | Admitting: Adult Health

## 2022-12-19 ENCOUNTER — Encounter: Payer: Self-pay | Admitting: Adult Health

## 2022-12-19 VITALS — BP 111/65 | HR 74 | Ht 67.0 in

## 2022-12-19 DIAGNOSIS — G932 Benign intracranial hypertension: Secondary | ICD-10-CM | POA: Diagnosis not present

## 2022-12-19 DIAGNOSIS — R51 Headache with orthostatic component, not elsewhere classified: Secondary | ICD-10-CM | POA: Diagnosis not present

## 2022-12-19 DIAGNOSIS — G971 Other reaction to spinal and lumbar puncture: Secondary | ICD-10-CM

## 2022-12-19 NOTE — Telephone Encounter (Signed)
Update: pt here today for appt

## 2022-12-20 ENCOUNTER — Ambulatory Visit
Admission: RE | Admit: 2022-12-20 | Discharge: 2022-12-20 | Disposition: A | Discharge status: Home / Self Care | Payer: BC Managed Care – PPO | Source: Ambulatory Visit | Attending: Adult Health | Admitting: Adult Health

## 2022-12-20 ENCOUNTER — Telehealth: Payer: Self-pay | Admitting: Adult Health

## 2022-12-20 DIAGNOSIS — G971 Other reaction to spinal and lumbar puncture: Secondary | ICD-10-CM

## 2022-12-20 MED ORDER — IOPAMIDOL (ISOVUE-M 200) INJECTION 41%
1.0000 mL | Freq: Once | INTRAMUSCULAR | Status: AC
  Administered 2022-12-20: 1 mL via EPIDURAL

## 2022-12-20 NOTE — Telephone Encounter (Signed)
I called pt, spoke to daughter, Manar.  Pt had blood patch done at 1300.  She is home now resting.  States her whole body hurts.  Wants to proceed with imging.

## 2022-12-20 NOTE — Discharge Instructions (Signed)
Blood Patch Discharge Instructions  Go home and rest quietly for the next 24 hours.  It is important to lie flat for the next 24 hours.  Get up only to go to the restroom.  You may lie in the bed or on a couch on your back, your stomach, your left side or your right side.  You may have one pillow under your head.  You may have pillows between your knees while you are on your side or under your knees while you are on your back.  DO NOT drive today.  Recline the seat as far back as it will go, while still wearing your seat belt, on the way home.  You may get up to go to the bathroom as needed.  You may sit up for 10 minutes to eat.  You may resume your normal diet and medications unless otherwise indicated.  Drink lots of extra fluids today and tomorrow..   You may resume normal activities after your 24 hours of bed rest is over; however, do not exert yourself strongly or do any heavy lifting tomorrow.  Call your physician for a follow-up appointment.   If you have any questions  after you arrive home, please call [phone].  Discharge instructions have been explained to the patient.  The patient, or the person responsible for the patient, fully understands these instructions.

## 2022-12-20 NOTE — Telephone Encounter (Signed)
Referral for neurology fax to [facility]. Phone: [phone], Fax: [phone].

## 2022-12-26 NOTE — Telephone Encounter (Signed)
Positional headache will order CT Myelogram

## 2022-12-26 NOTE — Addendum Note (Signed)
Addended by: Sarina Ill B on: 12/26/2022 07:58 PM   Modules accepted: Orders

## 2022-12-27 ENCOUNTER — Telehealth: Payer: Self-pay | Admitting: Neurology

## 2022-12-27 NOTE — Telephone Encounter (Signed)
[facility] sent to [facility] they schedule MRI. [phone]

## 2022-12-28 NOTE — Telephone Encounter (Signed)
[facility] left me a voice mail that they sent the order to [facility] for scheduling and [facility] is saying the CT order needs to be with contrast only. Can you please update this and I will send it? thanks

## 2022-12-28 NOTE — Addendum Note (Signed)
Addended by: Sarina Ill B on: 12/28/2022 02:56 PM   Modules accepted: Orders

## 2022-12-28 NOTE — Telephone Encounter (Signed)
I faxed the new order to [facility].

## 2022-12-28 NOTE — Telephone Encounter (Signed)
Order changed thanks

## 2022-12-29 ENCOUNTER — Inpatient Hospital Stay (HOSPITAL_BASED_OUTPATIENT_CLINIC_OR_DEPARTMENT_OTHER): Admission: RE | Admit: 2022-12-29 | Payer: BC Managed Care – PPO | Source: Ambulatory Visit | Admitting: Radiology

## 2022-12-29 ENCOUNTER — Ambulatory Visit: Payer: Medicaid Other | Admitting: Adult Health

## 2022-12-29 DIAGNOSIS — Z1231 Encounter for screening mammogram for malignant neoplasm of breast: Secondary | ICD-10-CM

## 2023-01-10 ENCOUNTER — Encounter: Payer: Self-pay | Admitting: Adult Health

## 2023-01-11 ENCOUNTER — Telehealth: Payer: Self-pay | Admitting: *Deleted

## 2023-01-12 NOTE — Telephone Encounter (Signed)
Dr Jaynee Eagles and Jinny Blossom checked the CT. We saw mild arthritis not uncommon for age, but there was no nerve pinching and the spinal cord was normal. There was no leak seen and no reason was found for her symptoms.

## 2023-02-23 ENCOUNTER — Ambulatory Visit: Payer: Medicaid Other | Admitting: Cardiology

## 2023-04-23 ENCOUNTER — Encounter (HOSPITAL_COMMUNITY): Payer: Self-pay

## 2023-04-23 ENCOUNTER — Emergency Department (HOSPITAL_COMMUNITY): Payer: BC Managed Care – PPO

## 2023-04-23 ENCOUNTER — Emergency Department (HOSPITAL_COMMUNITY)
Admission: EM | Admit: 2023-04-23 | Discharge: 2023-04-23 | Disposition: A | Discharge status: Home / Self Care | Payer: BC Managed Care – PPO | Attending: Emergency Medicine | Admitting: Emergency Medicine

## 2023-04-23 ENCOUNTER — Other Ambulatory Visit: Payer: Self-pay

## 2023-04-23 DIAGNOSIS — R42 Dizziness and giddiness: Secondary | ICD-10-CM | POA: Diagnosis not present

## 2023-04-23 DIAGNOSIS — R519 Headache, unspecified: Secondary | ICD-10-CM | POA: Insufficient documentation

## 2023-04-23 DIAGNOSIS — R112 Nausea with vomiting, unspecified: Secondary | ICD-10-CM | POA: Insufficient documentation

## 2023-04-23 LAB — DIFFERENTIAL
Abs Immature Granulocytes: 0.01 10*3/uL (ref 0.00–0.07)
Basophils Absolute: 0 10*3/uL (ref 0.0–0.1)
Basophils Relative: 0 %
Eosinophils Absolute: 0.1 10*3/uL (ref 0.0–0.5)
Eosinophils Relative: 4 %
Immature Granulocytes: 0 %
Lymphocytes Relative: 36 %
Lymphs Abs: 1.2 10*3/uL (ref 0.7–4.0)
Monocytes Absolute: 0.4 10*3/uL (ref 0.1–1.0)
Monocytes Relative: 11 %
Neutro Abs: 1.5 10*3/uL — ABNORMAL LOW (ref 1.7–7.7)
Neutrophils Relative %: 49 %

## 2023-04-23 LAB — CBG MONITORING, ED: Glucose-Capillary: 101 mg/dL — ABNORMAL HIGH (ref 70–99)

## 2023-04-23 LAB — CBC
HCT: 45.6 % (ref 36.0–46.0)
Hemoglobin: 14.9 g/dL (ref 12.0–15.0)
MCH: 28.1 pg (ref 26.0–34.0)
MCHC: 32.7 g/dL (ref 30.0–36.0)
MCV: 85.9 fL (ref 80.0–100.0)
Platelets: 304 10*3/uL (ref 150–400)
RBC: 5.31 MIL/uL — ABNORMAL HIGH (ref 3.87–5.11)
RDW: 13.3 % (ref 11.5–15.5)
WBC: 3.2 10*3/uL — ABNORMAL LOW (ref 4.0–10.5)
nRBC: 0 % (ref 0.0–0.2)

## 2023-04-23 LAB — COMPREHENSIVE METABOLIC PANEL
ALT: 19 U/L (ref 0–44)
AST: 21 U/L (ref 15–41)
Albumin: 3.2 g/dL — ABNORMAL LOW (ref 3.5–5.0)
Alkaline Phosphatase: 77 U/L (ref 38–126)
Anion gap: 9 (ref 5–15)
BUN: 8 mg/dL (ref 6–20)
CO2: 25 mmol/L (ref 22–32)
Calcium: 9.1 mg/dL (ref 8.9–10.3)
Chloride: 104 mmol/L (ref 98–111)
Creatinine, Ser: 0.74 mg/dL (ref 0.44–1.00)
GFR, Estimated: >60 mL/min (ref >60)
Glucose, Bld: 125 mg/dL — ABNORMAL HIGH (ref 70–99)
Potassium: 4.2 mmol/L (ref 3.5–5.1)
Sodium: 138 mmol/L (ref 135–145)
Total Bilirubin: 0.4 mg/dL (ref 0.3–1.2)
Total Protein: 7.5 g/dL (ref 6.5–8.1)

## 2023-04-23 LAB — I-STAT CHEM 8, ED
BUN: 7 mg/dL (ref 6–20)
Calcium, Ion: 1.15 mmol/L (ref 1.15–1.40)
Chloride: 106 mmol/L (ref 98–111)
Creatinine, Ser: 0.8 mg/dL (ref 0.44–1.00)
Glucose, Bld: 122 mg/dL — ABNORMAL HIGH (ref 70–99)
HCT: 45 % (ref 36.0–46.0)
Hemoglobin: 15.3 g/dL — ABNORMAL HIGH (ref 12.0–15.0)
Potassium: 4 mmol/L (ref 3.5–5.1)
Sodium: 140 mmol/L (ref 135–145)
TCO2: 25 mmol/L (ref 22–32)

## 2023-04-23 LAB — PROTIME-INR
INR: 1.1 (ref 0.8–1.2)
Prothrombin Time: 13.9 seconds (ref 11.4–15.2)

## 2023-04-23 LAB — APTT: aPTT: 30 seconds (ref 24–36)

## 2023-04-23 LAB — HCG, SERUM, QUALITATIVE: Preg, Serum: NEGATIVE

## 2023-04-23 LAB — ETHANOL: Alcohol, Ethyl (B): <10 mg/dL (ref <10)

## 2023-04-23 MED ORDER — SODIUM CHLORIDE 0.9 % IV BOLUS
1000.0000 mL | Freq: Once | INTRAVENOUS | Status: AC
  Administered 2023-04-23: 1000 mL via INTRAVENOUS

## 2023-04-23 MED ORDER — SODIUM CHLORIDE 0.9% FLUSH
3.0000 mL | Freq: Once | INTRAVENOUS | Status: AC
  Administered 2023-04-23: 3 mL via INTRAVENOUS

## 2023-04-23 MED ORDER — BUTALBITAL-APAP-CAFFEINE 50-325-40 MG PO TABS: 1.0000 | ORAL_TABLET | Freq: Four times a day (QID) | ORAL | 0 refills | Status: AC | PRN

## 2023-04-23 MED ORDER — DIPHENHYDRAMINE HCL 50 MG/ML IJ SOLN
50.0000 mg | Freq: Once | INTRAMUSCULAR | Status: AC
  Administered 2023-04-23: 50 mg via INTRAVENOUS
  Filled 2023-04-23: qty 1

## 2023-04-23 MED ORDER — METOCLOPRAMIDE HCL 5 MG/ML IJ SOLN
10.0000 mg | Freq: Once | INTRAMUSCULAR | Status: AC
  Administered 2023-04-23: 10 mg via INTRAVENOUS
  Filled 2023-04-23: qty 2

## 2023-04-23 MED ORDER — DEXAMETHASONE SODIUM PHOSPHATE 10 MG/ML IJ SOLN
10.0000 mg | Freq: Once | INTRAMUSCULAR | Status: AC
  Administered 2023-04-23: 10 mg via INTRAVENOUS
  Filled 2023-04-23: qty 1

## 2023-04-23 MED ORDER — KETOROLAC TROMETHAMINE 15 MG/ML IJ SOLN
15.0000 mg | Freq: Once | INTRAMUSCULAR | Status: AC
  Administered 2023-04-23: 15 mg via INTRAVENOUS
  Filled 2023-04-23: qty 1

## 2023-04-23 NOTE — ED Provider Notes (Signed)
Janice Hanson EMERGENCY DEPARTMENT AT [facility] Provider Note   CSN: [id number] Arrival date & time: 04/23/23  1222     History  Chief Complaint  Patient presents with   Dizziness   Emesis   Headache    Janice Hanson is a 57 y.o. female.  The history is provided by the patient and medical records. The history is limited by a language barrier. A language interpreter was used.  Dizziness Associated symptoms: headaches and vomiting   Emesis Associated symptoms: headaches   Headache Associated symptoms: dizziness and vomiting      57 year old Arabic speaking female with significant history of idiopathic intracranial hypertension, blindness, optic atrophy, recurrent headache, presenting with complaint of headache.  History obtained using daughter as language interpreter per patient's request.  Patient reports she has daily headaches but for the past 3 days the headaches intensified.  She described as a throbbing sensation primarily to the right side of her forehead behind her eye with associated nausea and vomiting and increasing headache with positional change.  She does not endorse any fever or chills no runny nose sneezing or coughing no focal numbness no focal weakness no chest pain or shortness of breath no rash.  She is legally blind.  She also mention having a lumbar puncture procedure in February of this year which worsen his symptoms require multiple blood patch and since then daughter states she is mostly in bed.  She does follow-up with a "CSF team" at [facility] who recently had done some imaging and patient does have a follow-up appointment next month.  In the meantime at home she is taking Tylenol for her headache without adequate relief.  Headache similar to prior daily headaches except more intense.  No other environmental changes.  Home Medications Prior to Admission medications   Medication Sig Start Date End Date Taking? Authorizing Provider  acetaminophen  (TYLENOL) 500 MG tablet Take 500 mg by mouth every 6 (six) hours as needed for moderate pain or headache.    [provider]  atorvastatin (LIPITOR) 40 MG tablet Take 40 mg by mouth at bedtime. Patient not taking: Reported on 12/19/2022 09/17/22   [provider]  Cyanocobalamin (B-12 PO) Take 1 tablet by mouth daily.    [provider]  hydrochlorothiazide (HYDRODIURIL) 25 MG tablet Take 25 mg by mouth daily. Patient not taking: Reported on 12/19/2022 12/02/22   [provider]  meclizine (ANTIVERT) 25 MG tablet Take 2 tablets (50 mg total) by mouth 3 (three) times daily as needed for dizziness 12/07/22   Lonia Blood, MD  omeprazole (PRILOSEC) 40 MG capsule Take 40 mg by mouth daily.    [provider]  ondansetron (ZOFRAN) 4 MG tablet Take 1 tablet (4 mg total) by mouth every 6 (six) hours as needed for nausea. 12/07/22   Lonia Blood, MD      Allergies    Patient has no known allergies.    Review of Systems   Review of Systems  Gastrointestinal:  Positive for vomiting.  Neurological:  Positive for dizziness and headaches.  All other systems reviewed and are negative.   Physical Exam Updated Vital Signs BP (!) 104/54   Pulse 76   Temp 98.2 F (36.8 C) (Oral)   Resp 16   Ht 5\' 7"  (1.702 m)   Wt 122.9 kg   SpO2 96%   BMI 42.44 kg/m  Physical Exam Vitals and nursing note reviewed.  Constitutional:      General:  She is not in acute distress.    Appearance: She is well-developed. She is obese.  HENT:     Head: Atraumatic.  Eyes:     Conjunctiva/sclera: Conjunctivae normal.  Neck:     Meningeal: Brudzinski's sign and Kernig's sign absent.  Cardiovascular:     Rate and Rhythm: Normal rate.  Pulmonary:     Effort: Pulmonary effort is normal.  Abdominal:     General: Bowel sounds are normal.  Musculoskeletal:        General: Normal range of motion.     Cervical back: Normal range of motion and neck supple. No rigidity.   Lymphadenopathy:     Cervical: No cervical adenopathy.  Skin:    General: Skin is warm.     Findings: No rash.  Neurological:     Mental Status: She is alert.     GCS: GCS eye subscore is 4. GCS verbal subscore is 5. GCS motor subscore is 6.     Cranial Nerves: No cranial nerve deficit, dysarthria or facial asymmetry.  Psychiatric:        Mood and Affect: Mood normal.     ED Results / Procedures / Treatments   Labs (all labs ordered are listed, but only abnormal results are displayed) Labs Reviewed  CBC - Abnormal; Notable for the following components:      Result Value   WBC 3.2 (*)    RBC 5.31 (*)    All other components within normal limits  DIFFERENTIAL - Abnormal; Notable for the following components:   Neutro Abs 1.5 (*)    All other components within normal limits  COMPREHENSIVE METABOLIC PANEL - Abnormal; Notable for the following components:   Glucose, Bld 125 (*)    Albumin 3.2 (*)    All other components within normal limits  I-STAT CHEM 8, ED - Abnormal; Notable for the following components:   Glucose, Bld 122 (*)    Hemoglobin 15.3 (*)    All other components within normal limits  CBG MONITORING, ED - Abnormal; Notable for the following components:   Glucose-Capillary 101 (*)    All other components within normal limits  PROTIME-INR  APTT  ETHANOL  HCG, SERUM, QUALITATIVE    EKG EKG Interpretation Date/Time:  Sunday April 23 2023 12:37:57 EDT Ventricular Rate:  70 PR Interval:  154 QRS Duration:  88 QT Interval:  366 QTC Calculation: 395 R Axis:   40  Text Interpretation: Normal sinus rhythm Cannot rule out Anterior infarct , age undetermined Abnormal ECG Confirmed by Alvino Blood ([id number]) on 04/23/2023 2:38:02 PM  Radiology CT HEAD WO CONTRAST  Result Date: 04/23/2023 CLINICAL DATA:  Dizziness, neurological deficit EXAM: CT HEAD WITHOUT CONTRAST TECHNIQUE: Contiguous axial images were obtained from the base of the skull through the vertex  without intravenous contrast. RADIATION DOSE REDUCTION: This exam was performed according to the departmental dose-optimization program which includes automated exposure control, adjustment of the mA and/or kV according to patient size and/or use of iterative reconstruction technique. COMPARISON:  11/30/2021 FINDINGS: Brain: No acute intracranial findings are seen. There are no signs of bleeding within the cranium. Ventricles are nondilated. Cortical sulci are prominent. Vascular: Unremarkable Skull: No acute findings are seen. Sinuses/Orbits: Unremarkable. Other: There is increased amount of CSF in the sella suggesting partial empty sella. IMPRESSION: No acute intracranial findings are seen in noncontrast CT brain. Electronically Signed   By: Ernie Avena M.D.   On: 04/23/2023 13:05    Procedures Procedures  Medications Ordered in ED Medications  sodium chloride flush (NS) 0.9 % injection 3 mL (3 mLs Intravenous Given 04/23/23 1356)  ketorolac (TORADOL) 15 MG/ML injection 15 mg (15 mg Intravenous Given 04/23/23 1355)  dexamethasone (DECADRON) injection 10 mg (10 mg Intravenous Given 04/23/23 1355)  diphenhydrAMINE (BENADRYL) injection 50 mg (50 mg Intravenous Given 04/23/23 1354)  metoCLOPramide (REGLAN) injection 10 mg (10 mg Intravenous Given 04/23/23 1354)  sodium chloride 0.9 % bolus 1,000 mL (1,000 mLs Intravenous New Bag/Given 04/23/23 1353)    ED Course/ Medical Decision Making/ A&P                             Medical Decision Making Amount and/or Complexity of Data Reviewed Labs: ordered. Radiology: ordered.  Risk Prescription drug management.   BP (!) 104/54   Pulse 76   Temp 98.2 F (36.8 C) (Oral)   Resp 16   Ht 5\' 7"  (1.702 m)   Wt 122.9 kg   SpO2 96%   BMI 42.44 kg/m   52:82 PM 57 year old Arabic speaking female with significant history of idiopathic intracranial hypertension, blindness, optic atrophy, recurrent headache, presenting with complaint of headache.   History obtained using daughter as language interpreter per patient's request.  Patient reports she has daily headaches but for the past 3 days the headaches intensified.  She described as a throbbing sensation primarily to the right side of her forehead behind her eye with associated nausea and vomiting and increasing headache with positional change.  She does not endorse any fever or chills no runny nose sneezing or coughing no focal numbness no focal weakness no chest pain or shortness of breath no rash.  She is legally blind.  She also mention having a lumbar puncture procedure in February of this year which worsen his symptoms require multiple blood patch and since then daughter states she is mostly in bed.  She does follow-up with a "CSF team" at [facility] who recently had done some imaging and patient does have a follow-up appointment next month.  In the meantime at home she is taking Tylenol for her headache without adequate relief.  Headache similar to prior daily headaches except more intense.  No other environmental changes.  On exam, this is a morbidly obese female laying in bed appears to be in no acute discomfort.  She is legally blind.  Heart with normal rate and rhythm, lungs clear to auscultation abdomen is soft and nontender patient has equal strength throughout.  She does not have any focal neurodeficit on exam.  She is nontoxic in appearance.  Vital signs notable for blood pressure of 104/54.  She is not tachycardic no fever no hypoxia.  Due to known history of IIH, as well as previous complication with lumbar puncture required multiple blood patch at this time I felt patient would benefit more from symptomatic care for her bad headaches.  Her headache is acute on chronic.  Migraine cocktail initiated.  -Labs ordered, independently viewed and interpreted by me.  Labs remarkable for reassuring lab values, no significant anemia, no hypoglycemia, no electrolytes derangement -The patient was  maintained on a cardiac monitor.  I personally viewed and interpreted the cardiac monitored which showed an underlying rhythm of: NSR -Imaging independently viewed and interpreted by me and I agree with radiologist's interpretation.  Result remarkable for head CT without acute finding -This patient presents to the ED for concern of headache, this involves an extensive number of  treatment options, and is a complaint that carries with it a high risk of complications and morbidity.  The differential diagnosis includes IIH, migraine, tension headache, cluster headache, CRAO, Subdural venous thrombosis, stroke, mengingitis -Co morbidities that complicate the patient evaluation includes Hx of IIH, CSF leak, HTN -Treatment includes migraine cocktail.  -Reevaluation of the patient after these medicines showed that the patient improved -PCP office notes or outside notes reviewed -Escalation to admission/observation considered: patients feels much better, is comfortable with discharge, and will follow up with PCP -Prescription medication considered, patient comfortable with fioricet for headache -Social Determinant of Health considered which includes language barrier.          Final Clinical Impression(s) / ED Diagnoses Final diagnoses:  Bad headache    Rx / DC Orders ED Discharge Orders          Ordered    butalbital-acetaminophen-caffeine (FIORICET) 50-325-40 MG tablet  Every 6 hours PRN        04/23/23 1640              Fayrene Helper, PA-C 04/23/23 1640    Lonell Grandchild, MD 04/24/23 [phone]

## 2023-04-23 NOTE — ED Triage Notes (Signed)
Pt c/o dizziness, HA and vomitingx6 mos, but got worse yesterday.

## 2023-04-23 NOTE — Discharge Instructions (Addendum)
You have been evaluated for your headache.  Fortunately CT scan of the head along with your blood work today did not show any concerning finding.  Please follow-up closely with your headache specialist for further management of your condition.  Take Fioricet as needed for headache.

## 2023-04-25 ENCOUNTER — Telehealth: Payer: Self-pay | Admitting: Adult Health

## 2023-04-25 NOTE — Telephone Encounter (Signed)
Pt daughter called. Stated pt would like to make an appointment with Aundra Millet to talk about getting shunt placed.

## 2023-04-26 NOTE — Telephone Encounter (Signed)
Pt's husband called needing to speak to the RN as soon as possible. He states that pt is getting worse. Please advise.

## 2023-04-26 NOTE — Telephone Encounter (Signed)
I spoke with the patient's husband.  He said the patient was seen at the ER for worsening symptoms and they told her she had increased pressure in the brain and she has been shaking.  He said they are ready to move forward with the shunt and that is why he called.  I informed him that [facility] is who has been seeing the patient and who does the shunts.  I told him I had also seen where he had spoken with [facility] this morning.  They were going to send their note to the headache doctors and I anticipate they would call him back. I advised that if the patient is worsening the need to take her back to the emergency department.  I encouraged him to stay in close contact with [facility] about this and he verbalized understanding and appreciation for the call.

## 2023-04-26 NOTE — Telephone Encounter (Signed)
She was referred to [facility] and they did Myelogram that did not show a csf leak. Does she plan to follow back up with them?

## 2023-04-26 NOTE — Telephone Encounter (Addendum)
From 6/30 ER visit:  "She does follow-up with a "CSF team" at [facility] who recently had done some imaging and patient does have a follow-up appointment next month. In the meantime at home she is taking Tylenol for her headache without adequate relief. Headache similar to prior daily headaches except more intense."  04/26/2023 10:29 AM EDT Formatting of this note might be different from the original. Patient calling she had the CT myelogram she does not have a CSF leak. Caller reports dizziness, face and neck numbness, generalized shaking, vomiting, sensitivity to light and sound. Caller reports she was seen in the ED at [facility] on yesterday she was given some meds and instructed to follow up with Neurologist. Noted ED visit notes in Care Everywhere. She is a former patient of Dr. Delice Lesch informed caller I will send a message to headache providers routing encounter. Instructed patient if symptoms worsens she needs to back to ED or to the urgent care caller verbalized understanding. Electronically signed by Tacey Heap, RN

## 2023-05-01 NOTE — Telephone Encounter (Signed)
If she is working with [facility], I am not sure she needs the appointment with me on the 10t. Can you call them? I can;t do anything, if they want a shunt and are working with [facility] they need to go to [place].

## 2023-05-02 NOTE — Telephone Encounter (Signed)
Spoke with pt's daughter and made her aware of cancellation of pt's appointment

## 2023-05-03 ENCOUNTER — Ambulatory Visit: Payer: BC Managed Care – PPO | Admitting: Neurology

## 2023-06-28 ENCOUNTER — Ambulatory Visit: Payer: BC Managed Care – PPO | Admitting: Neurology

## 2023-09-20 ENCOUNTER — Emergency Department (HOSPITAL_COMMUNITY)
Admission: EM | Admit: 2023-09-20 | Discharge: 2023-09-20 | Disposition: A | Discharge status: Home / Self Care | Payer: BC Managed Care – PPO | Attending: Emergency Medicine | Admitting: Emergency Medicine

## 2023-09-20 ENCOUNTER — Other Ambulatory Visit: Payer: Self-pay | Admitting: Internal Medicine

## 2023-09-20 ENCOUNTER — Emergency Department (HOSPITAL_COMMUNITY): Payer: BC Managed Care – PPO

## 2023-09-20 ENCOUNTER — Other Ambulatory Visit: Payer: Self-pay

## 2023-09-20 ENCOUNTER — Encounter (HOSPITAL_COMMUNITY): Payer: Self-pay

## 2023-09-20 DIAGNOSIS — R42 Dizziness and giddiness: Secondary | ICD-10-CM | POA: Diagnosis not present

## 2023-09-20 DIAGNOSIS — R519 Headache, unspecified: Secondary | ICD-10-CM | POA: Insufficient documentation

## 2023-09-20 DIAGNOSIS — R112 Nausea with vomiting, unspecified: Secondary | ICD-10-CM | POA: Insufficient documentation

## 2023-09-20 DIAGNOSIS — G932 Benign intracranial hypertension: Secondary | ICD-10-CM

## 2023-09-20 LAB — CBC WITH DIFFERENTIAL/PLATELET
Abs Immature Granulocytes: 0 10*3/uL (ref 0.00–0.07)
Basophils Absolute: 0 10*3/uL (ref 0.0–0.1)
Basophils Relative: 1 %
Eosinophils Absolute: 0.1 10*3/uL (ref 0.0–0.5)
Eosinophils Relative: 3 %
HCT: 43.5 % (ref 36.0–46.0)
Hemoglobin: 14.1 g/dL (ref 12.0–15.0)
Immature Granulocytes: 0 %
Lymphocytes Relative: 37 %
Lymphs Abs: 1.5 10*3/uL (ref 0.7–4.0)
MCH: 27.4 pg (ref 26.0–34.0)
MCHC: 32.4 g/dL (ref 30.0–36.0)
MCV: 84.6 fL (ref 80.0–100.0)
Monocytes Absolute: 0.4 10*3/uL (ref 0.1–1.0)
Monocytes Relative: 10 %
Neutro Abs: 2 10*3/uL (ref 1.7–7.7)
Neutrophils Relative %: 49 %
Platelets: 252 10*3/uL (ref 150–400)
RBC: 5.14 MIL/uL — ABNORMAL HIGH (ref 3.87–5.11)
RDW: 14 % (ref 11.5–15.5)
WBC: 4 10*3/uL (ref 4.0–10.5)
nRBC: 0 % (ref 0.0–0.2)

## 2023-09-20 LAB — COMPREHENSIVE METABOLIC PANEL
ALT: 19 U/L (ref 0–44)
AST: 21 U/L (ref 15–41)
Albumin: 3.2 g/dL — ABNORMAL LOW (ref 3.5–5.0)
Alkaline Phosphatase: 81 U/L (ref 38–126)
Anion gap: 7 (ref 5–15)
BUN: 9 mg/dL (ref 6–20)
CO2: 21 mmol/L — ABNORMAL LOW (ref 22–32)
Calcium: 8.9 mg/dL (ref 8.9–10.3)
Chloride: 111 mmol/L (ref 98–111)
Creatinine, Ser: 0.83 mg/dL (ref 0.44–1.00)
GFR, Estimated: >60 mL/min (ref >60)
Glucose, Bld: 134 mg/dL — ABNORMAL HIGH (ref 70–99)
Potassium: 3.8 mmol/L (ref 3.5–5.1)
Sodium: 139 mmol/L (ref 135–145)
Total Bilirubin: 0.4 mg/dL (ref <1.2)
Total Protein: 7.2 g/dL (ref 6.5–8.1)

## 2023-09-20 MED ORDER — ONDANSETRON 4 MG PO TBDP
4.0000 mg | ORAL_TABLET | Freq: Once | ORAL | Status: DC
  Filled 2023-09-20: qty 1

## 2023-09-20 NOTE — ED Provider Notes (Signed)
[place] EMERGENCY DEPARTMENT AT [facility] Provider Note   CSN: [id number] Arrival date & time: 09/20/23  1937  History Chief Complaint  Patient presents with   Emesis Ann Held    HPI Janice Hanson is a 57 y.o. female presenting for dizziness. Family requested to interpret.  Patient states that she feels more comfortable family telling the story for cultural reasons. In short, she has a history of idiopathic intracranial hypertension, primarily followed at [facility]. Longstanding use of Diamox and scheduled lumbar punctures. Last week she felt herself getting dehydrated so she stopped the Diamox. She developed a headache the following day worsening lightheadedness and nausea vomiting. Called PCP today who recommended she come to the emergency department for evaluation.  She denies fevers chills nausea vomiting syncope shortness of breath at this time she is in no acute distress.  She has no neurologic symptoms denies numbness tingling, gait instability.  Patient's recorded medical, surgical, social, medication list and allergies were reviewed in the Snapshot window as part of the initial history.   Review of Systems   Review of Systems  Constitutional:  Negative for chills and fever.  HENT:  Negative for ear pain and sore throat.   Eyes:  Negative for pain and visual disturbance.  Respiratory:  Negative for cough and shortness of breath.   Cardiovascular:  Negative for chest pain and palpitations.  Gastrointestinal:  Positive for nausea. Negative for abdominal pain and vomiting.  Genitourinary:  Negative for dysuria and hematuria.  Musculoskeletal:  Negative for arthralgias and back pain.  Skin:  Negative for color change and rash.  Neurological:  Positive for headaches. Negative for seizures and syncope.  All other systems reviewed and are negative.   Physical Exam Updated Vital Signs BP (!) 100/56 (BP Location: Right Arm)   Pulse 67   Temp 98.1 F (36.7 C)  (Oral)   Resp 18   Ht 5\' 7"  (1.702 m)   Wt 122.9 kg   SpO2 99%   BMI 42.44 kg/m  Physical Exam Vitals and nursing note reviewed.  Constitutional:      General: She is not in acute distress.    Appearance: She is well-developed.  HENT:     Head: Normocephalic and atraumatic.  Eyes:     Conjunctiva/sclera: Conjunctivae normal.  Cardiovascular:     Rate and Rhythm: Normal rate and regular rhythm.     Heart sounds: No murmur heard. Pulmonary:     Effort: Pulmonary effort is normal. No respiratory distress.     Breath sounds: Normal breath sounds.  Abdominal:     Palpations: Abdomen is soft.     Tenderness: There is no abdominal tenderness.  Musculoskeletal:        General: No swelling.     Cervical back: Neck supple.  Skin:    General: Skin is warm and dry.     Capillary Refill: Capillary refill takes less than 2 seconds.  Neurological:     General: No focal deficit present.     Mental Status: She is alert and oriented to person, place, and time. Mental status is at baseline.     Cranial Nerves: No cranial nerve deficit.     Sensory: No sensory deficit.     Motor: No weakness.     Coordination: Coordination normal.  Psychiatric:        Mood and Affect: Mood normal.      ED Course/ Medical Decision Making/ A&P    Procedures Procedures   Medications Ordered  in ED Medications  ondansetron (ZOFRAN-ODT) disintegrating tablet 4 mg (4 mg Oral Not Given 09/20/23 2036)    Medical Decision Making:   57 year old female presenting with acute on chronic headaches in the setting of known idiopathic intracranial hypertension. Sent in by PCP.  PCP is covering admissions for the hospital right now, I called PCP Dr. Hanley Hays for more recommendations and history.  He recalled similar story as above.  Unfortunately, since patient was last seen locally, she has gotten countless lumbar punctures.  She cannot get a shunt due to surgical complications while they were attempting to put one  in.  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 She has refused to ED attempts due to history of complication from 80 attempts.  Plan at this time is to get a CT scan of her head to evaluate for degree of intracranial volume overload, will treat her nausea check for metabolic etiology of her nausea and headaches and plan for reassessment after CT has resulted.  If she has gross volume overload, will discuss with patient and family whether she would want to be admitted and held until a fluoroscopic LP can be performed we will follow-up in the outpatient setting for similar procedure while continuing to take her Diamox as prescribed.  Reassessment: CT fortunately benign.  Very well-appearing during prolonged observation (3 hours) in emergency room.  CT head without any other focal pathology. Discussed with patient.  She feels symptoms are very mild and she can follow-up in the outpatient setting with Dr. Mikeal Hawthorne.  Given well appearance at this time.   She will return to her normally scheduled Diamox and go back up to the full treatment dose as prior recommended by her neurologist and plan to follow-up with them as well as her PCP in the next week. Ambulatory tolerating p.o. intake no acute distress on reassessment.  Disposition:  I have considered need for hospitalization, however, considering all of the above, I believe this patient is stable for discharge at this time.  Patient/family educated about specific return precautions for given chief complaint and symptoms.  Patient/family educated about follow-up with PCP.     Patient/family expressed understanding of return precautions and need for follow-up. Patient spoken to regarding all imaging and laboratory results and appropriate follow up for these results. All education provided in verbal form with additional information in written  form. Time was allowed for answering of patient questions. Patient discharged.    Emergency Department Medication Summary:   Medications  ondansetron (ZOFRAN-ODT) disintegrating tablet 4 mg (4 mg Oral Not Given 09/20/23 2036)         Clinical Impression:  1. Acute nonintractable headache, unspecified headache type      Discharge   Final Clinical Impression(s) / ED Diagnoses Final diagnoses:  Acute nonintractable headache, unspecified headache type    Rx / DC Orders ED Discharge Orders     None         Glyn Ade, MD 09/20/23 2256

## 2023-09-20 NOTE — ED Triage Notes (Signed)
Patient reports emesis with dizziness and " shaking" onset yesterday .

## 2023-10-02 NOTE — Discharge Instructions (Signed)

## 2023-10-03 ENCOUNTER — Ambulatory Visit
Admission: RE | Admit: 2023-10-03 | Discharge: 2023-10-03 | Disposition: A | Discharge status: Home / Self Care | Payer: BC Managed Care – PPO | Source: Ambulatory Visit | Attending: Internal Medicine | Admitting: Internal Medicine

## 2023-10-03 DIAGNOSIS — G932 Benign intracranial hypertension: Secondary | ICD-10-CM

## 2024-01-15 ENCOUNTER — Other Ambulatory Visit: Payer: Self-pay | Admitting: Internal Medicine

## 2024-01-15 DIAGNOSIS — G932 Benign intracranial hypertension: Secondary | ICD-10-CM

## 2024-01-15 DIAGNOSIS — R42 Dizziness and giddiness: Secondary | ICD-10-CM

## 2024-01-15 DIAGNOSIS — R519 Headache, unspecified: Secondary | ICD-10-CM

## 2024-10-09 ENCOUNTER — Other Ambulatory Visit: Payer: Self-pay | Admitting: Internal Medicine

## 2024-10-09 DIAGNOSIS — G932 Benign intracranial hypertension: Secondary | ICD-10-CM

## 2024-10-09 DIAGNOSIS — R519 Headache, unspecified: Secondary | ICD-10-CM

## 2024-10-09 DIAGNOSIS — R42 Dizziness and giddiness: Secondary | ICD-10-CM

## 2024-10-15 NOTE — Discharge Instructions (Signed)

## 2024-10-18 ENCOUNTER — Ambulatory Visit
Admission: RE | Admit: 2024-10-18 | Discharge: 2024-10-18 | Disposition: A | Discharge status: Home / Self Care | Source: Ambulatory Visit | Attending: Internal Medicine | Admitting: Internal Medicine

## 2024-10-18 DIAGNOSIS — G932 Benign intracranial hypertension: Secondary | ICD-10-CM
